# Patient Record
Sex: Female | Born: 1965 | Race: White | Hispanic: No | Smoking: Former smoker
Health system: Southern US, Community
[De-identification: ages and names within clinical notes are randomized; demographics above are authoritative.]

## PROBLEM LIST (undated history)

## (undated) DIAGNOSIS — K759 Inflammatory liver disease, unspecified: Secondary | ICD-10-CM

## (undated) DIAGNOSIS — B2790 Infectious mononucleosis, unspecified without complication: Secondary | ICD-10-CM

## (undated) DIAGNOSIS — R17 Unspecified jaundice: Secondary | ICD-10-CM

## (undated) DIAGNOSIS — I10 Essential (primary) hypertension: Secondary | ICD-10-CM

## (undated) DIAGNOSIS — F32A Depression, unspecified: Secondary | ICD-10-CM

## (undated) DIAGNOSIS — R1310 Dysphagia, unspecified: Secondary | ICD-10-CM

## (undated) DIAGNOSIS — F418 Other specified anxiety disorders: Secondary | ICD-10-CM

## (undated) DIAGNOSIS — F319 Bipolar disorder, unspecified: Secondary | ICD-10-CM

## (undated) DIAGNOSIS — K7460 Unspecified cirrhosis of liver: Secondary | ICD-10-CM

## (undated) DIAGNOSIS — K769 Liver disease, unspecified: Secondary | ICD-10-CM

## (undated) DIAGNOSIS — F3181 Bipolar II disorder: Secondary | ICD-10-CM

## (undated) DIAGNOSIS — F329 Major depressive disorder, single episode, unspecified: Secondary | ICD-10-CM

## (undated) HISTORY — PX: APPENDECTOMY: SHX54

## (undated) HISTORY — PX: ESOPHAGOGASTRODUODENOSCOPY: SHX1529

## (undated) HISTORY — PX: COLONOSCOPY: SHX174

---

## 2011-05-27 ENCOUNTER — Encounter: Payer: Self-pay | Admitting: *Deleted

## 2011-05-27 ENCOUNTER — Emergency Department (HOSPITAL_COMMUNITY)
Admission: EM | Admit: 2011-05-27 | Discharge: 2011-05-27 | Disposition: A | Discharge status: Home / Self Care | Payer: Self-pay | Attending: Emergency Medicine | Admitting: Emergency Medicine

## 2011-05-27 DIAGNOSIS — F329 Major depressive disorder, single episode, unspecified: Secondary | ICD-10-CM | POA: Insufficient documentation

## 2011-05-27 DIAGNOSIS — F419 Anxiety disorder, unspecified: Secondary | ICD-10-CM

## 2011-05-27 DIAGNOSIS — F3289 Other specified depressive episodes: Secondary | ICD-10-CM | POA: Insufficient documentation

## 2011-05-27 DIAGNOSIS — F411 Generalized anxiety disorder: Secondary | ICD-10-CM | POA: Insufficient documentation

## 2011-05-27 DIAGNOSIS — Z79899 Other long term (current) drug therapy: Secondary | ICD-10-CM | POA: Insufficient documentation

## 2011-05-27 HISTORY — DX: Depression, unspecified: F32.A

## 2011-05-27 HISTORY — DX: Bipolar disorder, unspecified: F31.9

## 2011-05-27 HISTORY — DX: Major depressive disorder, single episode, unspecified: F32.9

## 2011-05-27 LAB — CBC
Hemoglobin: 16.4 g/dL — ABNORMAL HIGH (ref 12.0–15.0)
MCH: 29.9 pg (ref 26.0–34.0)
MCHC: 35.9 g/dL (ref 30.0–36.0)
Platelets: 251 10*3/uL (ref 150–400)
RDW: 14.1 % (ref 11.5–15.5)

## 2011-05-27 LAB — COMPREHENSIVE METABOLIC PANEL
AST: 35 U/L (ref 0–37)
Albumin: 4.4 g/dL (ref 3.5–5.2)
Alkaline Phosphatase: 124 U/L — ABNORMAL HIGH (ref 39–117)
BUN: 9 mg/dL (ref 6–23)
Chloride: 96 mEq/L (ref 96–112)
Potassium: 4.2 mEq/L (ref 3.5–5.1)
Sodium: 131 mEq/L — ABNORMAL LOW (ref 135–145)
Total Bilirubin: 0.9 mg/dL (ref 0.3–1.2)
Total Protein: 8.5 g/dL — ABNORMAL HIGH (ref 6.0–8.3)

## 2011-05-27 LAB — DIFFERENTIAL
Basophils Absolute: 0 10*3/uL (ref 0.0–0.1)
Basophils Relative: 0 % (ref 0–1)
Eosinophils Absolute: 0 10*3/uL (ref 0.0–0.7)
Neutro Abs: 10.8 10*3/uL — ABNORMAL HIGH (ref 1.7–7.7)
Neutrophils Relative %: 81 % — ABNORMAL HIGH (ref 43–77)

## 2011-05-27 LAB — RAPID URINE DRUG SCREEN, HOSP PERFORMED
Amphetamines: NOT DETECTED
Barbiturates: NOT DETECTED
Benzodiazepines: NOT DETECTED
Cocaine: NOT DETECTED

## 2011-05-27 LAB — ETHANOL: Alcohol, Ethyl (B): <11 mg/dL (ref 0–11)

## 2011-05-27 LAB — URINALYSIS, ROUTINE W REFLEX MICROSCOPIC
Glucose, UA: NEGATIVE mg/dL
Hgb urine dipstick: NEGATIVE
Ketones, ur: 15 mg/dL — AB
Protein, ur: NEGATIVE mg/dL
pH: 5.5 (ref 5.0–8.0)

## 2011-05-27 LAB — URINE MICROSCOPIC-ADD ON

## 2011-05-27 MED ORDER — LORAZEPAM 1 MG PO TABS
1.0000 mg | ORAL_TABLET | Freq: Once | ORAL | Status: AC
  Administered 2011-05-27: 1 mg via ORAL
  Filled 2011-05-27: qty 1

## 2011-05-27 MED ORDER — ACETAMINOPHEN 325 MG PO TABS
650.0000 mg | ORAL_TABLET | ORAL | Status: DC | PRN
Start: 2011-05-27 — End: 2011-05-27

## 2011-05-27 MED ORDER — ZIPRASIDONE HCL 60 MG PO CAPS: 60.0000 mg | ORAL_CAPSULE | Freq: Two times a day (BID) | ORAL | Status: DC

## 2011-05-27 MED ORDER — ALPRAZOLAM 0.5 MG PO TABS: ORAL_TABLET | ORAL | Status: DC

## 2011-05-27 MED ORDER — CITALOPRAM HYDROBROMIDE 20 MG PO TABS: 20.0000 mg | ORAL_TABLET | Freq: Every day | ORAL | Status: DC

## 2011-05-27 MED ORDER — NICOTINE 21 MG/24HR TD PT24
21.0000 mg | MEDICATED_PATCH | Freq: Every day | TRANSDERMAL | Status: DC
  Administered 2011-05-27: 21 mg via TRANSDERMAL
  Filled 2011-05-27: qty 1

## 2011-05-27 MED ORDER — LORAZEPAM 1 MG PO TABS
1.0000 mg | ORAL_TABLET | ORAL | Status: DC | PRN
  Administered 2011-05-27: 1 mg via ORAL
  Filled 2011-05-27: qty 1

## 2011-05-27 NOTE — ED Notes (Signed)
Pt. Given extra blanket.

## 2011-05-27 NOTE — ED Notes (Signed)
Pt states "I've been going to [place], I keep feeling worse, and worse, and worse, I need help, I can't live like this anymore, the anxiety is bothering me"

## 2011-05-27 NOTE — ED Provider Notes (Signed)
Medical screening examination/treatment/procedure(s) were conducted as a shared visit with non-physician practitioner(s) and myself.  I personally evaluated the patient during the encounter Pt is a 46 year old woman who came to [facility] complaining of anxiety.  She has been followed for this at [facility].  They had treated her with Geodon and Vistaril without benefit.  Physical exam today showed her to have no physical findings.  Medical screening lab testing was negative.  Pt had telepsych consult, with recommendations as follows:  Pt is to stop effexor and Vistaril.  She is to take Geodon 60 mg bid with food, Celexa 20 mg qd, and Xanax 0.5 mg qid prn anxiety.  She can be discharged to followup with [facility].   Carleene Cooper III, MD 05/27/11 1500

## 2011-05-27 NOTE — ED Notes (Signed)
Pt. Signed all D/C papers, given all (3) medication scripts and D/C papers. Pt. Given 2 bags of belongings and walked out to D/C window with [employer].  Pt. Ambulated without any difficulty out of psych ED.

## 2011-05-27 NOTE — ED Notes (Signed)
Pt placed in paper scrubs, wanded by Security, 1 pt bag of belongings & 1 black duffle bag

## 2011-05-27 NOTE — ED Notes (Signed)
Pt. Offered shower, declined.

## 2011-05-27 NOTE — ED Notes (Signed)
Patients sister phoned. Wanted to express her concern about decision to discharge the patient. Patient's sister starting to yell "my sister is not herself. She's not ready to come home. I want you to tell me what's going on". Explained to patient family that due to HIPPA that I was not allowed to discuss anything about the patient. Sister yelling again that "I have the right to know what's going on. If you send her home we'll just bring her back. She's too shy to tell you anything." Explained to family member that I would have patients RN go and speak with patient about concerns. Sister hung up on me.

## 2011-05-27 NOTE — ED Notes (Signed)
Pt. States that she doesn't want to talk, just sleep.  Pt. Denies SI/HI, A/V/H.  States that she has been having anxiety attacks for 3 days.

## 2011-06-07 ENCOUNTER — Emergency Department (HOSPITAL_COMMUNITY)
Admission: EM | Admit: 2011-06-07 | Discharge: 2011-06-08 | Disposition: A | Discharge status: Other Acute Inpatient Hospital | Payer: Self-pay | Attending: Internal Medicine | Admitting: Internal Medicine

## 2011-06-07 ENCOUNTER — Encounter (HOSPITAL_COMMUNITY): Payer: Self-pay | Admitting: *Deleted

## 2011-06-07 DIAGNOSIS — F329 Major depressive disorder, single episode, unspecified: Secondary | ICD-10-CM

## 2011-06-07 DIAGNOSIS — F331 Major depressive disorder, recurrent, moderate: Secondary | ICD-10-CM | POA: Insufficient documentation

## 2011-06-07 DIAGNOSIS — E119 Type 2 diabetes mellitus without complications: Secondary | ICD-10-CM | POA: Insufficient documentation

## 2011-06-07 DIAGNOSIS — F17200 Nicotine dependence, unspecified, uncomplicated: Secondary | ICD-10-CM | POA: Insufficient documentation

## 2011-06-07 DIAGNOSIS — F411 Generalized anxiety disorder: Secondary | ICD-10-CM | POA: Insufficient documentation

## 2011-06-07 DIAGNOSIS — Z79899 Other long term (current) drug therapy: Secondary | ICD-10-CM | POA: Insufficient documentation

## 2011-06-07 LAB — CBC
Hemoglobin: 16.2 g/dL — ABNORMAL HIGH (ref 12.0–15.0)
Platelets: 262 10*3/uL (ref 150–400)
RBC: 5.53 MIL/uL — ABNORMAL HIGH (ref 3.87–5.11)
WBC: 11.8 10*3/uL — ABNORMAL HIGH (ref 4.0–10.5)

## 2011-06-07 LAB — URINALYSIS, ROUTINE W REFLEX MICROSCOPIC
Glucose, UA: NEGATIVE mg/dL
Specific Gravity, Urine: 1.017 (ref 1.005–1.030)

## 2011-06-07 LAB — DIFFERENTIAL
Lymphocytes Relative: 16 % (ref 12–46)
Lymphs Abs: 1.9 10*3/uL (ref 0.7–4.0)
Monocytes Relative: 6 % (ref 3–12)
Neutrophils Relative %: 77 % (ref 43–77)

## 2011-06-07 LAB — BASIC METABOLIC PANEL
CO2: 22 mEq/L (ref 19–32)
Chloride: 96 mEq/L (ref 96–112)
GFR calc non Af Amer: 83 mL/min — ABNORMAL LOW (ref >90)
Glucose, Bld: 177 mg/dL — ABNORMAL HIGH (ref 70–99)
Potassium: 3.7 mEq/L (ref 3.5–5.1)
Sodium: 134 mEq/L — ABNORMAL LOW (ref 135–145)

## 2011-06-07 LAB — RAPID URINE DRUG SCREEN, HOSP PERFORMED
Amphetamines: NOT DETECTED
Benzodiazepines: POSITIVE — AB
Cocaine: NOT DETECTED
Opiates: NOT DETECTED
Tetrahydrocannabinol: NOT DETECTED

## 2011-06-07 LAB — URINE MICROSCOPIC-ADD ON

## 2011-06-07 MED ORDER — ZOLPIDEM TARTRATE 5 MG PO TABS: 5.0000 mg | ORAL_TABLET | Freq: Every evening | ORAL | Status: DC | PRN

## 2011-06-07 MED ORDER — IBUPROFEN 600 MG PO TABS: 600.0000 mg | ORAL_TABLET | Freq: Three times a day (TID) | ORAL | Status: DC | PRN

## 2011-06-07 MED ORDER — NICOTINE 21 MG/24HR TD PT24
21.0000 mg | MEDICATED_PATCH | Freq: Every day | TRANSDERMAL | Status: DC
  Administered 2011-06-07 – 2011-06-08 (×2): 21 mg via TRANSDERMAL
  Filled 2011-06-07 (×2): qty 1

## 2011-06-07 MED ORDER — ACETAMINOPHEN 325 MG PO TABS
650.0000 mg | ORAL_TABLET | ORAL | Status: DC | PRN
  Administered 2011-06-08: 650 mg via ORAL
  Filled 2011-06-07: qty 2

## 2011-06-07 MED ORDER — ONDANSETRON HCL 4 MG PO TABS: 4.0000 mg | ORAL_TABLET | Freq: Three times a day (TID) | ORAL | Status: DC | PRN

## 2011-06-07 MED ORDER — ALUM & MAG HYDROXIDE-SIMETH 200-200-20 MG/5ML PO SUSP: 30.0000 mL | ORAL | Status: DC | PRN

## 2011-06-07 MED ORDER — LORAZEPAM 1 MG PO TABS
1.0000 mg | ORAL_TABLET | Freq: Three times a day (TID) | ORAL | Status: DC | PRN
  Administered 2011-06-07 – 2011-06-08 (×3): 1 mg via ORAL
  Filled 2011-06-07 (×3): qty 1

## 2011-06-07 NOTE — ED Notes (Signed)
Patient on her menstrual cycle at this time.

## 2011-06-07 NOTE — ED Notes (Signed)
One patient belonging back and one black duffel bag locked in locker #[id number]

## 2011-06-07 NOTE — ED Provider Notes (Signed)
History     CSN: [id number]  Arrival date & time 06/07/11  1235   First MD Initiated Contact with Patient 06/07/11 1259      Chief Complaint  Patient presents with  . Depression    (Consider location/radiation/quality/duration/timing/severity/associated sxs/prior treatment) The history is provided by the patient.   46 year old female complains of severe depression. She states "I can't live like this". She has been having spontaneous crying spells, sleep disturbance, and anhedonia. She denies visual or auditory hallucinations. She is complaining of anxiety along with her depression. She states that she wishes she were dead and wishes to go to sleep and does not wake up but she denies having an actual suicidal plan. She's been feeling bad like this for the last month and has been a patient at [facility] but has not been getting adequate help there. She had been in the psychiatric emergency department earlier this month and had been discharged without placement for inpatient care. Symptoms are severe. Nothing makes it better nothing makes it worse.  Past Medical History  Diagnosis Date  . Bipolar disorder   . Anxiety   . Depression   . Diabetes mellitus     Past Surgical History  Procedure Date  . Cesarean section   . Appendectomy     No family history on file.  History  Substance Use Topics  . Smoking status: Current Everyday Smoker -- 1.0 packs/day  . Smokeless tobacco: Not on file  . Alcohol Use: Yes     sometimes    OB History    Grav Para Term Preterm Abortions TAB SAB Ect Mult Living                  Review of Systems  All other systems reviewed and are negative.    Allergies  Review of patient's allergies indicates no known allergies.  Home Medications   Current Outpatient Rx  Name Route Sig Dispense Refill  . ALPRAZOLAM 0.5 MG PO TABS  One by mouth every 6 hours if needed for anxiety. 60 tablet 0  . CITALOPRAM HYDROBROMIDE 20 MG PO TABS Oral Take 1  tablet (20 mg total) by mouth daily. 14 tablet 0  . CITALOPRAM HYDROBROMIDE 40 MG PO TABS Oral Take 40 mg by mouth daily.      Marland Kitchen GABAPENTIN 600 MG PO TABS Oral Take 600 mg by mouth QID.      Marland Kitchen HYDROXYZINE HCL 25 MG PO TABS Oral Take 25 mg by mouth 3 (three) times daily as needed.      Marland Kitchen ZIPRASIDONE HCL 60 MG PO CAPS Oral Take 1 capsule (60 mg total) by mouth 2 (two) times daily with a meal. 28 capsule 0  . ZIPRASIDONE HCL 80 MG PO CAPS Oral Take 80 mg by mouth 2 (two) times daily with a meal.        BP 156/107  Pulse 96  Temp(Src) 99.1 F (37.3 C) (Oral)  Resp 16  Wt 185 lb (83.915 kg)  SpO2 99%  LMP 06/05/2011  Physical Exam  Nursing note and vitals reviewed.  46 year old female who appears overtly depressed and is crying during the exam. Vital signs are normal except for mild hypertension blood pressure 156/107. Oxygen saturation is 99% which is normal. Head is normocephalic and atraumatic. PERRLA, EOMI. Oropharynx is clear. Neck is supple without adenopathy or JVD. Lungs are clear without rales, wheezes, rhonchi. Back is nontender. Heart has regular rate and rhythm without murmur. As the chest wall tenderness.  Abdomen is soft, flat, nontender without masses or hepatosplenomegaly. Extremities have no cyanosis or edema, full range of motion is present. Skin is warm and moist without rash. Neurologic-cranial nerves are intact, there no focal motor Center deficits. Psychiatric: She shows evidence of severe depression with crying spells, and refusing to make eye contact.  ED Course  Procedures (including critical care time)  Results for orders placed during the hospital encounter of 06/07/11  CBC      Component Value Range   WBC 11.8 (*) 4.0 - 10.5 (K/uL)   RBC 5.53 (*) 3.87 - 5.11 (MIL/uL)   Hemoglobin 16.2 (*) 12.0 - 15.0 (g/dL)   HCT 16.1 (*) 09.6 - 46.0 (%)   MCV 83.5  78.0 - 100.0 (fL)   MCH 29.3  26.0 - 34.0 (pg)   MCHC 35.1  30.0 - 36.0 (g/dL)   RDW 04.5  40.9 - 81.1 (%)    Platelets 262  150 - 400 (K/uL)  DIFFERENTIAL      Component Value Range   Neutrophils Relative 77  43 - 77 (%)   Neutro Abs 9.1 (*) 1.7 - 7.7 (K/uL)   Lymphocytes Relative 16  12 - 46 (%)   Lymphs Abs 1.9  0.7 - 4.0 (K/uL)   Monocytes Relative 6  3 - 12 (%)   Monocytes Absolute 0.7  0.1 - 1.0 (K/uL)   Eosinophils Relative 1  0 - 5 (%)   Eosinophils Absolute 0.1  0.0 - 0.7 (K/uL)   Basophils Relative 0  0 - 1 (%)   Basophils Absolute 0.0  0.0 - 0.1 (K/uL)  BASIC METABOLIC PANEL      Component Value Range   Sodium 134 (*) 135 - 145 (mEq/L)   Potassium 3.7  3.5 - 5.1 (mEq/L)   Chloride 96  96 - 112 (mEq/L)   CO2 22  19 - 32 (mEq/L)   Glucose, Bld 177 (*) 70 - 99 (mg/dL)   BUN 12  6 - 23 (mg/dL)   Creatinine, Ser 9.14  0.50 - 1.10 (mg/dL)   Calcium 78.2 (*) 8.4 - 10.5 (mg/dL)   GFR calc non Af Amer 83 (*) >90 (mL/min)   GFR calc Af Amer >90  >90 (mL/min)  URINALYSIS, ROUTINE W REFLEX MICROSCOPIC      Component Value Range   Color, Urine ORANGE (*) YELLOW    APPearance CLOUDY (*) CLEAR    Specific Gravity, Urine 1.017  1.005 - 1.030    pH 5.5  5.0 - 8.0    Glucose, UA NEGATIVE  NEGATIVE (mg/dL)   Hgb urine dipstick LARGE (*) NEGATIVE    Bilirubin Urine MODERATE (*) NEGATIVE    Ketones, ur 15 (*) NEGATIVE (mg/dL)   Protein, ur 30 (*) NEGATIVE (mg/dL)   Urobilinogen, UA 1.0  0.0 - 1.0 (mg/dL)   Nitrite NEGATIVE  NEGATIVE    Leukocytes, UA SMALL (*) NEGATIVE   URINE RAPID DRUG SCREEN (HOSP PERFORMED)      Component Value Range   Opiates NONE DETECTED  NONE DETECTED    Cocaine NONE DETECTED  NONE DETECTED    Benzodiazepines POSITIVE (*) NONE DETECTED    Amphetamines NONE DETECTED  NONE DETECTED    Tetrahydrocannabinol NONE DETECTED  NONE DETECTED    Barbiturates NONE DETECTED  NONE DETECTED   ETHANOL      Component Value Range   Alcohol, Ethyl (B) <11  0 - 11 (mg/dL)  POCT PREGNANCY, URINE      Component Value Range  Preg Test, Ur NEGATIVE    URINE MICROSCOPIC-ADD ON       Component Value Range   Squamous Epithelial / LPF FEW (*) RARE    WBC, UA 7-10  <3 (WBC/hpf)   RBC / HPF TOO NUMEROUS TO COUNT  <3 (RBC/hpf)   Bacteria, UA FEW (*) RARE    Urine-Other MUCOUS PRESENT     No results found.    1. Major depression      She is noted to the psychiatric emergency department for evaluation and appropriate placement.  MDM  Major depression with vague suicidal ideation but no suicidal plan. I do believe that her depression is severe enough that she would benefit from inpatient care. She will be moved to the psychiatric ED for consultation by ACT Team        Dione Booze, MD 06/08/11 [phone]

## 2011-06-07 NOTE — ED Notes (Signed)
Pt states "I have deep depression, I don't want to do anything but I can't keep living like this, [facility] just keeps refilling the same medicine, I came here & just got the same stuff again, I need a crisis unit, it's been a long, long time since I was anywhere, the last time was in [place], I can't go back home like this"

## 2011-06-07 NOTE — ED Notes (Signed)
Pt states she was discharged from here about ten days ago with a prescription and follow up at [facility].  Says that she has been having depression x 6 months and nothing is helping.  Denies SI/HI.
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
## 2011-06-08 ENCOUNTER — Encounter (HOSPITAL_COMMUNITY): Payer: Self-pay | Admitting: *Deleted

## 2011-06-08 ENCOUNTER — Inpatient Hospital Stay (HOSPITAL_COMMUNITY)
Admission: AD | Admit: 2011-06-08 | Discharge: 2011-06-11 | DRG: 885 | Disposition: A | Discharge status: Home / Self Care | Payer: Federal, State, Local not specified - Other | Source: Ambulatory Visit | Attending: Psychiatry | Admitting: Psychiatry

## 2011-06-08 DIAGNOSIS — F411 Generalized anxiety disorder: Secondary | ICD-10-CM

## 2011-06-08 DIAGNOSIS — F329 Major depressive disorder, single episode, unspecified: Secondary | ICD-10-CM | POA: Diagnosis present

## 2011-06-08 DIAGNOSIS — E559 Vitamin D deficiency, unspecified: Secondary | ICD-10-CM | POA: Diagnosis present

## 2011-06-08 DIAGNOSIS — Z79899 Other long term (current) drug therapy: Secondary | ICD-10-CM

## 2011-06-08 DIAGNOSIS — Z560 Unemployment, unspecified: Secondary | ICD-10-CM

## 2011-06-08 DIAGNOSIS — I10 Essential (primary) hypertension: Secondary | ICD-10-CM

## 2011-06-08 DIAGNOSIS — F332 Major depressive disorder, recurrent severe without psychotic features: Principal | ICD-10-CM

## 2011-06-08 DIAGNOSIS — E119 Type 2 diabetes mellitus without complications: Secondary | ICD-10-CM

## 2011-06-08 HISTORY — DX: Essential (primary) hypertension: I10

## 2011-06-08 MED ORDER — ALPRAZOLAM 0.5 MG PO TABS
0.5000 mg | ORAL_TABLET | Freq: Four times a day (QID) | ORAL | Status: DC | PRN
  Administered 2011-06-09 (×2): 0.5 mg via ORAL
  Filled 2011-06-08 (×2): qty 1

## 2011-06-08 MED ORDER — NAPROXEN 500 MG PO TABS
250.0000 mg | ORAL_TABLET | Freq: Two times a day (BID) | ORAL | Status: DC | PRN
  Administered 2011-06-10: 250 mg via ORAL
  Filled 2011-06-08: qty 1

## 2011-06-08 MED ORDER — ACETAMINOPHEN 325 MG PO TABS
650.0000 mg | ORAL_TABLET | Freq: Four times a day (QID) | ORAL | Status: DC | PRN
  Administered 2011-06-09 – 2011-06-10 (×2): 650 mg via ORAL

## 2011-06-08 MED ORDER — CITALOPRAM HYDROBROMIDE 40 MG PO TABS
60.0000 mg | ORAL_TABLET | Freq: Every day | ORAL | Status: DC
  Administered 2011-06-09 – 2011-06-11 (×3): 60 mg via ORAL
  Filled 2011-06-08 (×5): qty 1

## 2011-06-08 MED ORDER — HYDROXYZINE HCL 25 MG PO TABS: 25.0000 mg | ORAL_TABLET | Freq: Three times a day (TID) | ORAL | Status: DC | PRN

## 2011-06-08 MED ORDER — GABAPENTIN 600 MG PO TABS
600.0000 mg | ORAL_TABLET | Freq: Three times a day (TID) | ORAL | Status: DC
  Administered 2011-06-09 – 2011-06-11 (×8): 600 mg via ORAL
  Filled 2011-06-08 (×3): qty 1
  Filled 2011-06-08: qty 42
  Filled 2011-06-08: qty 1
  Filled 2011-06-08: qty 42
  Filled 2011-06-08: qty 1
  Filled 2011-06-08: qty 42
  Filled 2011-06-08: qty 1
  Filled 2011-06-08: qty 42
  Filled 2011-06-08 (×2): qty 1
  Filled 2011-06-08 (×2): qty 42
  Filled 2011-06-08: qty 1

## 2011-06-08 MED ORDER — ZIPRASIDONE HCL 60 MG PO CAPS
60.0000 mg | ORAL_CAPSULE | Freq: Two times a day (BID) | ORAL | Status: DC
  Filled 2011-06-08 (×2): qty 1

## 2011-06-08 MED ORDER — MAGNESIUM HYDROXIDE 400 MG/5ML PO SUSP: 30.0000 mL | Freq: Every day | ORAL | Status: DC | PRN

## 2011-06-08 MED ORDER — ALUM & MAG HYDROXIDE-SIMETH 200-200-20 MG/5ML PO SUSP: 30.0000 mL | ORAL | Status: DC | PRN

## 2011-06-08 MED ORDER — NAPROXEN SODIUM 220 MG PO TABS: 220.0000 mg | ORAL_TABLET | ORAL | Status: DC | PRN

## 2011-06-08 NOTE — BHH Counselor (Signed)
Patient accepted at [facility] [address] to Dan Humphreys. Support paperwork must be completed prior to d/c.

## 2011-06-08 NOTE — Progress Notes (Signed)
Patient ID: Katrina Cohen, female   DOB: Dec 17, 1965, 46 y.o.   MRN: [id number] Pt admitted voluntarily d/t reports of increased depression and anxiety. Pt states she has been experiencing a change in her mood x 2 weeks. Pt states that stressor at that time include financial problems and anxiety r/t son visiting dad in [place]. Son initially missed flight. Pt also states her son will soon be leaving for college. Pt began tearful during admission while discussing her stressors. Pt states "I can't function". "I have been crying all the time and can't do what I want to do". "I can't leave the house sometimes because I get so panicked". "I have spent four days in bed. Pt states she is having passive suicidal ideation but contracts for safety. Denies HI and AVH. Pt states that she attempted suicide by overdose when she was a teenager. Pt reports hx of HTN, DM, TMJ, depression, anxiety, and bipolar. Pt currently smokes 1 ppd.  Fifteen minute checks initiated. Pt oriented to unit.

## 2011-06-08 NOTE — Tx Team (Signed)
Initial Interdisciplinary Treatment Plan  PATIENT STRENGTHS: (choose at least two) Ability for insight Average or above average intelligence Communication skills General fund of knowledge Motivation for treatment/growth Supportive family/friends  PATIENT STRESSORS: Financial difficulties Health problems Occupational concerns   PROBLEM LIST: Problem List/Patient Goals Date to be addressed Date deferred Reason deferred Estimated date of resolution  Suicidal ideation 06/08/2011     depression 06/08/2011     anxiety 06/08/2011     bipolar 06/08/2011                                    DISCHARGE CRITERIA:  Ability to meet basic life and health needs Adequate post-discharge living arrangements Improved stabilization in mood, thinking, and/or behavior Motivation to continue treatment in a less acute level of care Need for constant or close observation no longer present Reduction of life-threatening or endangering symptoms to within safe limits Verbal commitment to aftercare and medication compliance  PRELIMINARY DISCHARGE PLAN: Attend aftercare/continuing care group Participate in family therapy Return to previous living arrangement  PATIENT/FAMIILY INVOLVEMENT: This treatment plan has been presented to and reviewed with the patient, Katrina Cohen, and/or family member,.  The patient and family have been given the opportunity to ask questions and make suggestions.  Hoover Browns 06/08/2011, 8:54 PM

## 2011-06-08 NOTE — ED Notes (Signed)
Pt eating supper waiting for transport

## 2011-06-08 NOTE — BHH Counselor (Signed)
Writer informed Dr. Sherren Mocha and patients nurse-Mike/Janie. Writer completing patients support paperwork. Pt to be transported to [facility] via security.

## 2011-06-08 NOTE — ED Notes (Signed)
1 black duffel, 1 pt belongings bag sent w/ pt

## 2011-06-08 NOTE — ED Notes (Signed)
Pt reports that the geodon interacts w/ the celexa causing her heart to pound

## 2011-06-08 NOTE — ED Notes (Signed)
Up to the desk on the phone 

## 2011-06-08 NOTE — ED Provider Notes (Signed)
Patient accepted to behavioral health.  Cyndra Numbers, MD 06/08/11 [phone]

## 2011-06-08 NOTE — ED Notes (Signed)
Report called-transport approx [phone]

## 2011-06-08 NOTE — ED Provider Notes (Signed)
Patient currently sleeping. She is awaiting placement for major depression.  Dione Booze, MD 06/08/11 [phone]

## 2011-06-09 LAB — GLUCOSE, CAPILLARY: Glucose-Capillary: 130 mg/dL — ABNORMAL HIGH (ref 70–99)

## 2011-06-09 MED ORDER — TRAZODONE HCL 50 MG PO TABS
50.0000 mg | ORAL_TABLET | Freq: Every evening | ORAL | Status: DC | PRN
  Administered 2011-06-09 – 2011-06-11 (×3): 50 mg via ORAL
  Filled 2011-06-09: qty 1
  Filled 2011-06-09: qty 14
  Filled 2011-06-09 (×4): qty 1
  Filled 2011-06-09 (×2): qty 14
  Filled 2011-06-09: qty 1

## 2011-06-09 MED ORDER — CHLORPROMAZINE HCL 25 MG PO TABS
25.0000 mg | ORAL_TABLET | Freq: Three times a day (TID) | ORAL | Status: DC
  Administered 2011-06-09 – 2011-06-11 (×7): 25 mg via ORAL
  Filled 2011-06-09: qty 1
  Filled 2011-06-09 (×2): qty 42
  Filled 2011-06-09 (×2): qty 1
  Filled 2011-06-09: qty 42
  Filled 2011-06-09 (×5): qty 1

## 2011-06-09 MED ORDER — LITHIUM CARBONATE 300 MG PO CAPS
300.0000 mg | ORAL_CAPSULE | Freq: Two times a day (BID) | ORAL | Status: DC
  Administered 2011-06-10 – 2011-06-11 (×3): 300 mg via ORAL
  Filled 2011-06-09 (×3): qty 1
  Filled 2011-06-09: qty 28
  Filled 2011-06-09: qty 1
  Filled 2011-06-09: qty 28

## 2011-06-09 MED ORDER — LISINOPRIL 10 MG PO TABS
10.0000 mg | ORAL_TABLET | Freq: Every day | ORAL | Status: DC
  Administered 2011-06-09 – 2011-06-11 (×3): 10 mg via ORAL
  Filled 2011-06-09 (×3): qty 1
  Filled 2011-06-09: qty 14

## 2011-06-09 MED ORDER — CIPROFLOXACIN HCL 500 MG PO TABS
500.0000 mg | ORAL_TABLET | Freq: Two times a day (BID) | ORAL | Status: DC
  Administered 2011-06-09 – 2011-06-11 (×5): 500 mg via ORAL
  Filled 2011-06-09 (×2): qty 1
  Filled 2011-06-09: qty 3
  Filled 2011-06-09 (×3): qty 1
  Filled 2011-06-09: qty 3
  Filled 2011-06-09 (×2): qty 1
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
## 2011-06-09 NOTE — Progress Notes (Signed)
Patient appeared very sad and depressed during this assessment. Although she denied SI/Hi and denied hallucinations. Pt went to cafeteria with peers for supper. Q 15 minute checks continue to maintain safety.

## 2011-06-09 NOTE — Progress Notes (Signed)
Writer has introduced herself to this newly admitted pt. Pt was lying calmly in bed. Pt was informed of her newly prescribed meds. Pt and roommate had preferences for room temperatures. Pt was provided with extra blankets for comfort. Pt was not interested in spending the night in the quiet room. Continued and support and availability as needed has been extended to this pt.

## 2011-06-09 NOTE — Progress Notes (Signed)
[facility] Group Notes: (Counselor/Nursing/MHT/Case Management/Adjunct) 06/09/2011   @1 :15pm  Type of Therapy:  Group Therapy  Participation Level:  Did Not Attend   [name] 06/09/2011 3:09 PM

## 2011-06-09 NOTE — Progress Notes (Signed)
[facility] Group Notes: (Counselor/Nursing/MHT/Case Management/Adjunct) 06/09/2011   @  11:00am   Type of Therapy:  Group Therapy  Participation Level:  Minimal  Participation Quality:  Attentive  Affect:  Depressed, Tearful  Cognitive:  Appropriate  Insight:  Minimal  Engagement in Group: Minimal  Engagement in Therapy:  None  Modes of Intervention:  Support and Exploration  Summary of Progress/Problems: Katrina Cohen spoke only briefly, stating that her anxiety comes out of nowhere and overtakes her. She seemed somewhat attentive, but did not share further. Near the end of group she appeared to get anxious and another group member tried to help her to calm down.   [name] 06/09/2011 12:52 PM

## 2011-06-09 NOTE — Progress Notes (Signed)
Pt presents teary eyed- this am. Pt reports a high level of anxiety. Pt was offered and given a prn dose of xanax. Pt reports some anxiety related to being in a new place around new people. Pt was offered the opportunity to have a tray delivered  to her room for breakfast, to assist her in adjusting to her surroundings. As of now pt says she will eat in the cafeteria.   Pt diabetes is diet controlled. Her CBG this morning was 130.

## 2011-06-09 NOTE — [facility] Suicide Risk Assessment (Signed)
Suicide Risk Assessment  Admission Assessment     Demographic factors:  Assessment Details Time of Assessment: Admission Information Obtained From: Patient Current Mental Status:  Current Mental Status: Suicidal ideation indicated by patient Loss Factors:    Historical Factors:  Historical Factors: Prior suicide attempts;Family history of mental illness or substance abuse Risk Reduction Factors:  Risk Reduction Factors: Responsible for children under 28 years of age;Sense of responsibility to family;Living with another person, especially a relative;Positive social support  CLINICAL FACTORS:   Bipolar Disorder:   Bipolar II Depressive phase Depression:   Anhedonia Comorbid alcohol abuse/dependence Hopelessness Alcohol/Substance Abuse/Dependencies Previous Psychiatric Diagnoses and Treatments  COGNITIVE FEATURES THAT CONTRIBUTE TO RISK:  Thought constriction (tunnel vision)    SUICIDE RISK:   Moderate:  Frequent suicidal ideation with limited intensity, and duration, some specificity in terms of plans, no associated intent, good self-control, limited dysphoria/symptomatology, some risk factors present, and identifiable protective factors, including available and accessible social support.  Suicidal Ideation:   Plan:  No  Intent:  No  Means:  No  Homicidal Ideation:   Plan:  No  Intent:  No  Means:  No  Mental Status: General Appearance /Behavior:  Casual Eye Contact:  Fair Motor Behavior:  Restlestness Speech:  Normal Level of Consciousness:  Alert Mood:  10 on a scale of 1 is the least and 10 is the most Affect:  Appropriate Anxiety Level:  10 on a scale of 1 is the least and 10 is the most Thought Process:  Coherent Thought Content:  WNL Perception:  Normal Judgment:  Fair Insight:  Present Cognition:  Orientation time, place and person Concentration Yes Sleep:     Vital Signs:Blood pressure 163/99, pulse 86, temperature 96.9 F (36.1 C), temperature  source Oral, resp. rate 18, last menstrual period 06/05/2011. Lab Results: Results for orders placed during the hospital encounter of 06/08/11 (from the past 48 hour(s))  GLUCOSE, CAPILLARY     Status: Abnormal   Collection Time   06/09/11  5:42 AM      Component Value Range Comment   Glucose-Capillary 130 (*) 70 - 99 (mg/dL)      Risk of harm to self is low in that she is scared to die and had an attempt once as a teenager, but is somewhat elevated by her current severe depression and anxiety.  Risk of harm to others is low in that she has never been in a fight and has never threatened to harm anyone else.  Admit pt and add Lithium to her regimen.  Will also add Thorazine for that too.   Katrina Cohen 06/09/2011, 5:56 PM

## 2011-06-09 NOTE — Progress Notes (Signed)
Patient ID: Katrina Cohen, female   DOB: 1965/08/18, 46 y.o.   MRN: [id number] Pt is awake and active this AM. Pt denies SI/HI and AVH. Pt is attending groups and is cooperating with staff. Pt is tearful and anxious today. Pt c/o heart palpitations r/t Geodon. Medication changes made by MD. Pt primary stressor is that her son will be leaving for college. Writer offered self and encouraged pt to rejoin her church and seek friendship and volunteer opportunities. Writer will continue to monitor.

## 2011-06-09 NOTE — Tx Team (Signed)
Interdisciplinary Treatment Plan Update (Adult)  Date:  06/09/2011  Time Reviewed:  10:27 AM   Progress in Treatment: Attending groups: Yes Participating in groups:  Yes, reports she does not want to give specifics in group setting  Taking medication as prescribed:  Yes Tolerating medication: Yes Family/Significant othe contact made:  Counselor requesting consent to speak with sister Patient understands diagnosis: Yes Discussing patient identified problems/goals with staff:  Yes Medical problems stabilized or resolved: Yes Denies suicidal/homicidal ideation: Yes Issues/concerns per patient self-inventory:  No  Other:  New problem(s) identified: None  Reason for Continuation of Hospitalization: Anxiety Depression Medication stabilization  Interventions implemented related to continuation of hospitalization:  Medication stabilization, safety checks q 15 mins, group attendance  Additional comments: Katrina Cohen reports she has not be suicidal at all.   Estimated length of stay: 3-5 days  Discharge Plan: Discharge home with sister and follow up with [facility]  New goal(s):  Review of initial/current patient goals per problem list:   1.  Goal(s): Decrease symptoms of depression  Met:  No  Target date: by discharge  As evidenced by:  Katrina Cohen will rate depression at 3 or less (rates at 10 on admission)  2.  Goal (s):  Decrease anxiety symptoms   Met:  No  Target date: by discharge  As evidenced by: Katrina Cohen will rate depression at less than 5 (rates at 9 today)  3.  Goal(s): Reduce potential for self-harm  Met:  Yes  Target date: by discharge  As evidenced by: Katrina Cohen denies any suicidal or homicidal thoughts    Attendees: Patient:  Katrina Cohen 06/09/2011 10:57 AM   Family:   06/09/2011 10:27 AM  Physician:  Dr Orson Aloe, MD 06/09/2011 10:27 AM  Nursing:   Omelia Blackwater, RN 06/09/2011 10:27 AM  Case Manager:  Reyes Ivan, LCSWA 06/09/2011 10:27 AM    Counselor:  Angus Palms, LCSW 06/09/2011 10:27 AM  Other:  Cato Mulligan, RN  06/09/2011 10:27 AM  Other:  Wilmon Arms, counseling intern 06/09/2011 10:27 AM  Other:   06/09/2011 10:27 AM  Other:   06/09/2011 10:27 AM   Scribe for Treatment Team:   [name], 06/09/2011 10:27 AM
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
## 2011-06-09 NOTE — Progress Notes (Signed)
Pt attended discharge planning group and actively participated.  Pt presents with tearful affect and depressed mood.  Pt states she has been dealing with depression for a long time.  Pt states she's made a lot of mistakes in her life which cause her to feel depressed.  Pt was tearful throughout the conversation and states she is tired of crying all the time.  Pt states she stays in bed and doesn't eat lately.  Pt states she lives in [place] with her sister and 46 year old son.  Pt is most upset about the thought of her son graduating high school next year and leaving her to go to college.  Pt states she regularly goes to [facility] and will return there for medication management and therapy.  SW will schedule follow up for pt.  No further needs at this time.    Reyes Ivan, LCSWA 06/09/2011  1:38 PM

## 2011-06-10 DIAGNOSIS — F329 Major depressive disorder, single episode, unspecified: Secondary | ICD-10-CM | POA: Diagnosis present

## 2011-06-10 LAB — VITAMIN D 25 HYDROXY (VIT D DEFICIENCY, FRACTURES): Vit D, 25-Hydroxy: 21 ng/mL — ABNORMAL LOW (ref 30–89)

## 2011-06-10 NOTE — Progress Notes (Signed)
Patient has been up and about in the milieu today.  Has been sad and depressed most of the day.  Has attended groups today, but has not been really interactive.  Interacting well with peers and staff.  Rates depression at an 8 and hopelessness at 4.  Appetite has been good.

## 2011-06-10 NOTE — Progress Notes (Signed)
[facility] Group Notes:  (Counselor/Nursing/MHT/Case Management/Adjunct)  06/10/2011 1:11 PM  Type of Therapy:  Group Therapy  Participation Level:  None  Participation Quality:  Appropriate and Attentive  Affect:  Appropriate  Cognitive:  Alert and Appropriate  Insight:  None  Engagement in Group:  Limited  Engagement in Therapy:  Limited  Modes of Intervention:  Education, Support and Exploration  Summary of Progress/Problems: Patient did not verbally participate in group discussion. Although, she was very attentive to her peers. Group participants were given a handout to complete and discuss what they put on there task sheet. Katrina Cohen filled out her task sheet, but she not discuss what she wrote.   Wilmon Arms 06/10/2011, 1:11 PM  Katrina Cohen 06/10/2011  2:17 PM

## 2011-06-10 NOTE — Progress Notes (Signed)
Lying quietly in bed with eyes closed.  Safety maintained via Q 15 min activity and location safety checks.

## 2011-06-10 NOTE — Progress Notes (Signed)
[facility] Group Notes:  (Counselor/Nursing/MHT/Case Management/Adjunct)  06/10/2011 2:56 PM  Type of Therapy:  Group Therapy  Participation Level:  Active  Participation Quality:  Appropriate and Attentive  Affect:  Appropriate  Cognitive:  Alert and Appropriate  Insight:  Limited  Engagement in Group:  Limited  Engagement in Therapy:  Limited  Modes of Intervention:  Education and Exploration  Summary of Progress/Problems: Patient participated in relaxation technique using Love and Care Meditation.   Katrina Cohen 06/10/2011, 2:56 PM  Cosigned by: Angus Palms, LCSW

## 2011-06-10 NOTE — Progress Notes (Signed)
Patient seen during d/c planning group.  She advised of admitting to the hospital due to depression and anxiety.  She currently rates depression at eight and anxiety at four.  She currently denies SI/HI.  She advised of being followed by [facility] and stated that she is not on the right medications.  Patient is concerned that upon discharge, Vesta Mixer will change medications. She reports having home and transportation.  She will need assistance with indigent medications.

## 2011-06-11 DIAGNOSIS — F3130 Bipolar disorder, current episode depressed, mild or moderate severity, unspecified: Secondary | ICD-10-CM

## 2011-06-11 DIAGNOSIS — E559 Vitamin D deficiency, unspecified: Secondary | ICD-10-CM | POA: Diagnosis present

## 2011-06-11 MED ORDER — CITALOPRAM HYDROBROMIDE 20 MG PO TABS
60.0000 mg | ORAL_TABLET | Freq: Every day | ORAL | Status: DC
  Filled 2011-06-11 (×2): qty 42

## 2011-06-11 MED ORDER — TRAZODONE HCL 50 MG PO TABS: 50.0000 mg | ORAL_TABLET | Freq: Every evening | ORAL | Status: AC | PRN

## 2011-06-11 MED ORDER — LITHIUM CARBONATE 300 MG PO CAPS: 300.0000 mg | ORAL_CAPSULE | Freq: Two times a day (BID) | ORAL | Status: DC

## 2011-06-11 MED ORDER — CHLORPROMAZINE HCL 25 MG PO TABS: 25.0000 mg | ORAL_TABLET | Freq: Three times a day (TID) | ORAL | Status: AC

## 2011-06-11 MED ORDER — LISINOPRIL 10 MG PO TABS: 10.0000 mg | ORAL_TABLET | Freq: Every day | ORAL | Status: DC

## 2011-06-11 MED ORDER — CIPROFLOXACIN HCL 500 MG PO TABS: 500.0000 mg | ORAL_TABLET | Freq: Two times a day (BID) | ORAL | Status: AC

## 2011-06-11 NOTE — Progress Notes (Signed)
[facility] Adult Inpatient Family/Significant Other Suicide Prevention Education  Suicide Prevention Education:  Education Completed; Katrina Cohen, sister,  has been identified by the patient as the family member/significant other with whom the patient will be residing, and identified as the person(s) who will aid the patient in the event of a mental health crisis (suicidal ideations/suicide attempt).  With written consent from the patient, the family member/significant other has been provided the following suicide prevention education, prior to the and/or following the discharge of the patient.  The suicide prevention education provided includes the following:  Suicide risk factors  Suicide prevention and interventions  National Suicide Hotline telephone number  [facility] assessment telephone number  [facility] Emergency Assistance 911  [facility] and/or Residential Mobile Crisis Unit telephone number  Request made of family/significant other to:  Remove weapons (e.g., guns, rifles, knives), all items previously/currently identified as safety concern.    Remove drugs/medications (over-the-counter, prescriptions, illicit drugs), all items previously/currently identified as a safety concern.  Katrina Cohen reports that she has no concerns about Katrina Cohen being a danger to herself or others, though she is very concerned at how depressed Katrina Cohen has been. She verbalized understanding of suicide prevention information and had no further questions. She reports that Katrina Cohen does not have access to weapons.   Katrina Cohen 06/11/2011, 9:46 AM

## 2011-06-11 NOTE — Progress Notes (Signed)
Adult Services Patient-Family Contact/Session  Attendees:  Counselor and Katrina Cohen, Katrina Cohen  Goal(s):  To address concerns, to assess safey concerns, to educate Cohen on suicide prevention  Safety Concerns:  None  Narrative:  Katrina Cohen reports that she has no concerns about Katrina Cohen being a danger to herself or others, though she is very concerned at how depressed Katrina Cohen has been. She indicated that Katrina Cohen seems to be progressing, but not to her baseline yet. Katrina Cohen reported that she is on disability for Bipolar disorder and will help Katrina Cohen get Medicaid after discharge, but is concerned that going back to [place] until [place] comes through will not be helpful, as [facility] may change the medications Katrina Cohen puts Katrina Cohen on. Counselor explained that all discharge information including medication list will be sent to [facility] to make the transition as smooth as possible. Katrina Cohen verbalized understanding of suicide prevention information and had no further questions.  Barrier(s):  Concern from both [place] and her Cohen about Katrina Cohen changing the medications prescribed by inpatient psychiatrist  Interventions:  Counselor collected collateral information. Counselor assured Katrina Cohen that Katrina Cohen's medication list will be shared with outpatient follow up provider. Counselor educated Katrina Cohen on suicide prevention.  Recommendation(s):  Katrina Cohen to remain in [place] until medications are stabilized and symtpoms of anxiety and depression decrease. Katrina Cohen to follow up with aftercare appointments as set by case manager  Follow-up Required:  No  Explanation:  No safety concerns  Katrina Cohen 06/11/2011, 9:54 AM

## 2011-06-11 NOTE — Discharge Summary (Signed)
Patient ID: Katrina Cohen MRN: [id number] DOB/AGE: 06/16/1965 46 y.o.  Admit date: 06/08/2011 Discharge date: 06/11/2011  Reason for Admission: Major depression and anxiety   Hospital Course:  History of Present Illness:: Katrina Cohen is a 46 year old Caucasian female, admitted to [facility] from the [facility] ED with complaints of major depression and anxiety. Patient reports, I have been extremely depressed with increased anxiety x few months. It got very worse since 1 week. My life is not going the direction that I want it to go. I am unemployed since 2009 and broke financially. I tried to apply for disability, I am currently waiting for a hearing on it. In 2009, I was fired from my job because of my health condition. While a patient in this hospital, patient was started on a medication regimen for her depression and anxiety. They include Lithium Carbonate and Thorazine. She received antibiotics therapy for urinary tract infection. Patient reports on daily basis a reduction in signs and of depression and anxiety.  She participated in group counseling and activities. Patient reports that she learned a lot of coping skills to help her cope better in times of major stress. She reports that she has learned to not indulge in alcohol consumption to manage her anxiety as she has learned that it only made her depression and anxiety worse. She adds that she will not stress on things so much as it makes her lose control instead of finding a solution to a problem.  She is discharged to the home that she shared with son and sister. She will follow up at [facility] on an outpatient basis. VIT D is low and pt is encouraged to increase intake of Vit D as this low level can contribute to depression.  Discharge Diagnoses:  AXIS I Bipolar disorder, depressed  AXIS II Deferred  AXIS III Past Medical History  Diagnosis Date  . Bipolar disorder   . Anxiety   . Depression   . Diabetes mellitus   . Hypertension   Vitamin  D deficiency  AXIS IV economic problems, housing problems and occupational problems  AXIS V 61-70 mild symptoms   Condition on  Discharge: Suicidal Ideation:   Plan:  No  Intent:  No  Means:  No  Homicidal Ideation:   Plan:  No  Intent:  No  Means:  No  Mental Status: General Appearance /Behavior:  Neat Eye Contact:  Good Motor Behavior:  Normal Speech:  Normal Level of Consciousness:  Alert Mood:  4 on a scale of 1 is the least and 10 is the most Affect:  Appropriate Anxiety Level:  4 on a scale of 1 is the least and 10 is the most Thought Process:  Coherent Thought Content:  WNL Perception:  Normal Judgment:  Good Insight:  Present Cognition:  Orientation time, place and person Sleep:  Number of Hours: 5   Vital Signs:Blood pressure 91/68, pulse 98, temperature 97.3 F (36.3 C), temperature source Oral, resp. rate 18, last menstrual period 06/05/2011. Lab Results: Results for orders placed during the hospital encounter of 06/08/11 (from the past 48 hour(s))  VITAMIN D 25 HYDROXY     Status: Abnormal   Collection Time   06/10/11  6:20 AM      Component Value Range Comment   Vit D, 25-Hydroxy 21 (*) 30 - 89 (ng/mL)      Reports what she has learned from this hospital stay is that they never want to take Geodon again and that  she needs to take care of herself and advocate more for herself.  Risk of harm to self is low because she has only had 1 suicide attempt in teens, has worked hard to do well and stay out of the hospital for 17 years.  Has to live for her son, her dog, and for her to either get disability or get back to work.  Risk of harm to others is elevated by her history of multiple fights and history of disorderly conduct charge.  She seems to try to peaceably solve problems now.  Plan:  Medication List  As of 06/11/2011  3:04 PM   STOP taking these medications         ALPRAZolam 0.5 MG tablet      ziprasidone 60 MG capsule      ziprasidone 80 MG  capsule         TAKE these medications         chlorproMAZINE 25 MG tablet   Commonly known as: THORAZINE   Take 1 tablet (25 mg total) by mouth 3 (three) times daily.      ciprofloxacin 500 MG tablet   Commonly known as: CIPRO   Take 1 tablet (500 mg total) by mouth 2 (two) times daily. Until finished      citalopram 20 MG tablet   Commonly known as: CELEXA   Take 60 mg by mouth daily.      gabapentin 600 MG tablet   Commonly known as: NEURONTIN   Take 600 mg by mouth 3 (three) times daily.      hydrOXYzine 25 MG tablet   Commonly known as: ATARAX/VISTARIL   Take 25 mg by mouth 3 (three) times daily as needed. For anxiety      lisinopril 10 MG tablet   Commonly known as: PRINIVIL,ZESTRIL   Take 1 tablet (10 mg total) by mouth daily.      lithium carbonate 300 MG capsule   Take 1 capsule (300 mg total) by mouth 2 (two) times daily after a meal.      naproxen sodium 220 MG tablet   Commonly known as: ANAPROX   Take 220 mg by mouth as needed. For pain.      traZODone 50 MG tablet   Commonly known as: DESYREL   Take 1 tablet (50 mg total) by mouth at bedtime and may repeat dose one time if needed.           Follow-up Information    Follow up with Dayna Ramus. (Your appointment with French Ana is at 9 a..m. on  Tuesday, June 15, 2011)    Contact information:   [address] [place], [place]  [address]  [phone]      Follow up with Clerance Lav [facility] on 06/21/2011. (Your appointment with Clerance Lav is scheduled for 3:40 p.m. on Monday, June 21, 2011)    Contact information:   [address]  [phone]        SignedDan Humphreys, EDWIN 06/11/2011, 3:04 PM

## 2011-06-11 NOTE — Progress Notes (Addendum)
[facility] Case Management Discharge Plan:  Will you be returning to the same living situation after discharge: Yes,  Willl return home with family At discharge, do you have transportation home?:Yes,  Family to transport for discharge Do you have the ability to pay for your medications:No.  Assisted with indigent medications  Interagency Information:     Release of information consent forms completed and in the chart;  Patient's signature needed at discharge.  Patient to Follow up at:  Patient denies SI/HI:   Yes,  Katrina Cohen is not endorsing SI at this time    Safety Planning and Suicide Prevention discussed:  Yes,  Review during discharge planning groups  Barrier to discharge identified:No.  Summary and Recommendations:  Follow up with [facility] and advocate for no changes as she feels stable on current meds    Katrina Cohen, Katrina Cohen July 06/11/2011, 10:31 AM

## 2011-06-11 NOTE — Tx Team (Signed)
Interdisciplinary Treatment Plan Update (Adult)  Date:  06/11/2011  Time Reviewed:  11:31 AM   Progress in Treatment: Attending groups:   Yes   Participating in groups:  Yes Taking medication as prescribed:  Yes Tolerating medication:  Yes Family/Significant othe contact made: Counselor made contact with sister Patient understands diagnosis:  Yes Discussing patient identified problems/goals with staff: Yes Medical problems stabilized or resolved: Yes Denies suicidal/homicidal ideation:Yes Issues/concerns per patient self-inventory: None identified Other:  New problem(s) identified:  Reason for Continuation of Hospitalization:  Interventions implemented related to continuation of hospitalization:  Additional comments:  Estimated length of stay:  Discharge home today  Discharge Plan:  Home with family - Follow up [facility]  New goal(s):  Review of initial/current patient goals per problem list:    1.  Goal(s):  .Eliminate SI   Met:  Yes  Target date:  D/c  As evidenced by:  Katrina Cohen is denying SI   2.  Goal (s):  Reduce depression  Met:  Yes  Target date:  d/c  As evidenced by:  Rates depression at four; rated at ten on admission  3.  Goal(s):  Reduce anxiety  Met:  Yes  Target date:d/c  As evidenced by:  Rates anxiety at two, rated at nine on admisison  4.  Goal(s):  Stabilize on medications  Met:  Yes  Target date:  d/c  As evidenced by:  Katrina Cohen reports medications are working; less symptomatic - no heart palpitations  Attendees: Patient:  Katrina Cohen 06/11/2011  11:32 AM   Family:     Physician:  Orson Aloe, MD 06/11/2011 11:31 AM   Nursing:   Cato Mulligan, RN 06/11/2011 11:31 AM   CaseManager:  Juline Patch, LCSW 06/11/2011 11:31 AM   Counselor:  Angus Palms, LCSW 06/11/2011 11:31 AM   Other:  Consuello Bossier, NP 06/11/2011 11:31 AM   Other:  Reyes Ivan, LCSWA 06/11/2011  11:31 AM   Other:     Other:      Scribe for Treatment Team:     Wynn Banker, LCSW,  06/11/2011 11:31 AM

## 2011-06-11 NOTE — Progress Notes (Signed)
Patient affects and mood continues to be flat and depressed. She appears gloomy and very sad, denies SI/HI. Pt came to medication window at 0038 and requested for her repeat dose of Trazodone. Medication given and ordered. Q 15 minute checks continue as ordered to maintain patient's safety.

## 2011-06-11 NOTE — Progress Notes (Signed)
Recreation Therapy Notes  06/11/2011         Time: 1415      Group Topic/Focus: The focus of this group is on discussing various styles of communication and communicating assertively using 'I' (feeling) statements.  Participation Level: Minimal  Participation Quality: Attentive  Affect: Appropriate  Cognitive: Oriented   Additional Comments: Patient missed about half of group as she was meeting with MD.   Ashley Royalty, Shamiah Kahler 06/11/2011 4:27 PM

## 2011-06-11 NOTE — [facility] Suicide Risk Assessment (Signed)
Suicide Risk Assessment  Discharge Assessment     Demographic factors:  Assessment Details Time of Assessment: Admission Information Obtained From: Patient Current Mental Status:  Current Mental Status: Suicidal ideation indicated by patient Risk Reduction Factors:  Risk Reduction Factors: Responsible for children under 46 years of age;Sense of responsibility to family;Living with another person, especially a relative;Positive social support  CLINICAL FACTORS:   Severe Anxiety and/or Agitation Depression:   Anhedonia Hopelessness Chronic Pain Previous Psychiatric Diagnoses and Treatments Medical Diagnoses and Treatments/Surgeries  COGNITIVE FEATURES THAT CONTRIBUTE TO RISK:  No Cognitive risk factors noted.   SUICIDE RISK:   Minimal: No identifiable suicidal ideation.  Patients presenting with no risk factors but with morbid ruminations; may be classified as minimal risk based on the severity of the depressive symptoms  Suicidal Ideation:   Plan:  No  Intent:  No  Means:  No  Homicidal Ideation:   Plan:  No  Intent:  No  Means:  No  Mental Status: General Appearance /Behavior:  Neat Eye Contact:  Good Motor Behavior:  Normal Speech:  Normal Level of Consciousness:  Alert Mood:  4 on a scale of 1 is the least and 10 is the most Affect:  Appropriate Anxiety Level:  4 on a scale of 1 is the least and 10 is the most Thought Process:  Coherent Thought Content:  WNL Perception:  Normal Judgment:  Good Insight:  Present Cognition:  Orientation time, place and person Sleep:  Number of Hours: 5   Vital Signs:Blood pressure 91/68, pulse 98, temperature 97.3 F (36.3 C), temperature source Oral, resp. rate 18, last menstrual period 06/05/2011. Lab Results: Results for orders placed during the hospital encounter of 06/08/11 (from the past 48 hour(s))  VITAMIN D 25 HYDROXY     Status: Abnormal   Collection Time   06/10/11  6:20 AM      Component Value Range Comment   Vit D, 25-Hydroxy 21 (*) 30 - 89 (ng/mL)      Reports what she has learned from this hospital stay is that they never want to take Geodon again and that she needs to take care of herself and advocate more for herself.  Risk of harm to self is low because she has only had 1 suicide attempt in teens, has worked hard to do well and stay out of the hospital for 17 years.  Has to live for her son, her dog, and for her to either get disability or get back to work.  Risk of harm to others is elevated by her history of multiple fights and history of disorderly conduct charge.  She seems to try to peaceably solve problems now.  Continue Medication List  As of 06/11/2011  2:50 PM   STOP taking these medications         ALPRAZolam 0.5 MG tablet      ziprasidone 60 MG capsule      ziprasidone 80 MG capsule         TAKE these medications         chlorproMAZINE 25 MG tablet   Commonly known as: THORAZINE   Take 1 tablet (25 mg total) by mouth 3 (three) times daily.      ciprofloxacin 500 MG tablet   Commonly known as: CIPRO   Take 1 tablet (500 mg total) by mouth 2 (two) times daily. Until finished      citalopram 20 MG tablet   Commonly known as: CELEXA   Take 60  mg by mouth daily.      gabapentin 600 MG tablet   Commonly known as: NEURONTIN   Take 600 mg by mouth 3 (three) times daily.      hydrOXYzine 25 MG tablet   Commonly known as: ATARAX/VISTARIL   Take 25 mg by mouth 3 (three) times daily as needed. For anxiety      lisinopril 10 MG tablet   Commonly known as: PRINIVIL,ZESTRIL   Take 1 tablet (10 mg total) by mouth daily.      lithium carbonate 300 MG capsule   Take 1 capsule (300 mg total) by mouth 2 (two) times daily after a meal.      naproxen sodium 220 MG tablet   Commonly known as: ANAPROX   Take 220 mg by mouth as needed. For pain.      traZODone 50 MG tablet   Commonly known as: DESYREL   Take 1 tablet (50 mg total) by mouth at bedtime and may repeat dose one  time if needed.           Because Neurontin helps anxiety, pain, and mood, Lithium helps control mood, Throazine and Vistaril help control anxiety, Celexa helps boost mood, Prinivil helps control blood pressure, Trazodone helps with insomnia., Cipro is for an infection.  Mead Slane 06/11/2011, 2:50 PM

## 2011-06-11 NOTE — Discharge Summary (Signed)
Discharge Note  Patient:  Katrina Cohen is an 46 y.o., female DOB:  December 31, 1965  Date of Admission:  06/08/2011  Date of Discharge:  06/11/2011  Level of Care:  OP  Discharge destination:  HOME  Is patient on multiple antipsychotic therapies at discharge:  NO  Patient phone:  [phone] (home) Patient address:   [address] [place]  [place] [address]  The patient received suicide prevention pamphlet:  YES Belongings returned:  Judene Companion, Disaya Walt 06/11/2011,3:04 PM

## 2011-06-11 NOTE — Progress Notes (Signed)
[facility] Group Notes: (Counselor/Nursing/MHT/Case Management/Adjunct) 06/11/2011   @  11:00am   Type of Therapy:  Group Therapy  Participation Level:  Limited  Participation Quality:  Attentive, Supportive, Appropriate  Affect:  Blunted  Cognitive:  Appropriate  Insight:  Limited  Engagement in Group: Good   Engagement in Therapy:  Limited  Modes of Intervention:  Support and Exploration  Summary of Progress/Problems:  Katrina Cohen was attentive and though she did not share personally she was supportive of other group members' statements.   [name] 06/11/2011 12:29 PM

## 2011-06-11 NOTE — Progress Notes (Signed)
[facility] Group Notes:  (Counselor/Nursing/MHT/Case Management/Adjunct)  06/11/2011 1:15pm  Type of Therapy:  Psychoeducational Skills - [facility] [place]  Participation Level:  Active  Participation Quality:  Appropriate and Attentive  Affect:  Blunted  Cognitive:  Appropriate  Insight:  Good  Engagement in Group:  Good  Engagement in Therapy:  None  Modes of Intervention:  Education and Support  Summary of Progress/Problems: Eriyonna was very engaged in discussion with speaker from [facility]. She expressed appreciation for the the organization and interest in the support groups it runs.   [name] 06/11/2011  2:35 PM

## 2011-06-11 NOTE — Progress Notes (Signed)
Patient ID: Katrina Cohen, female   DOB: 06/28/1965, 46 y.o.   MRN: [id number] Writer reviewed discharge instructions with pt including medications, follow up appointments and crisis intervention. Pt verbally acknowledged understanding of all instructions. Pt denies SI/HI and states that she feels prepared to discharge at this this time. Pt mood and affect are appropriate to the situation. Pt belongings returned from locker and she is released into her own care.

## 2011-06-14 NOTE — Progress Notes (Signed)
Patient Discharge Instructions:  Admission Note Faxed,  06/14/2011 Discharge Note Faxed,   06/14/2011 After Visit Summary Faxed,  06/14/2011 Faxed to the Next Level Care provider:  06/14/2011 D/C Summary faxed 06/14/2011 Facesheet faxed 06/14/2011   Faxed to Sharene Butters @ [phone]  Wandra Scot, 06/14/2011, 5:26 PM

## 2013-01-31 ENCOUNTER — Other Ambulatory Visit (HOSPITAL_COMMUNITY): Payer: Self-pay | Admitting: Internal Medicine

## 2013-01-31 DIAGNOSIS — R05 Cough: Secondary | ICD-10-CM

## 2013-02-02 ENCOUNTER — Other Ambulatory Visit (HOSPITAL_COMMUNITY): Payer: Self-pay | Admitting: Internal Medicine

## 2013-02-02 DIAGNOSIS — R05 Cough: Secondary | ICD-10-CM

## 2013-02-08 ENCOUNTER — Encounter (HOSPITAL_COMMUNITY): Payer: Self-pay

## 2013-02-08 ENCOUNTER — Ambulatory Visit (HOSPITAL_COMMUNITY)
Admission: RE | Admit: 2013-02-08 | Discharge: 2013-02-08 | Disposition: A | Discharge status: Home / Self Care | Payer: Medicaid Other | Source: Ambulatory Visit | Attending: Internal Medicine | Admitting: Internal Medicine

## 2013-02-08 DIAGNOSIS — R05 Cough: Secondary | ICD-10-CM

## 2013-02-08 DIAGNOSIS — R059 Cough, unspecified: Secondary | ICD-10-CM | POA: Insufficient documentation

## 2013-02-08 DIAGNOSIS — K7689 Other specified diseases of liver: Secondary | ICD-10-CM | POA: Insufficient documentation

## 2013-02-08 MED ORDER — IOHEXOL 300 MG/ML  SOLN
80.0000 mL | Freq: Once | INTRAMUSCULAR | Status: AC | PRN
  Administered 2013-02-08: 80 mL via INTRAVENOUS

## 2014-11-17 ENCOUNTER — Encounter (HOSPITAL_COMMUNITY): Payer: Self-pay

## 2014-11-17 ENCOUNTER — Emergency Department (HOSPITAL_COMMUNITY)
Admission: EM | Admit: 2014-11-17 | Discharge: 2014-11-17 | Disposition: A | Discharge status: Home / Self Care | Payer: Medicaid Other | Attending: Emergency Medicine | Admitting: Emergency Medicine

## 2014-11-17 DIAGNOSIS — R1310 Dysphagia, unspecified: Secondary | ICD-10-CM | POA: Insufficient documentation

## 2014-11-17 DIAGNOSIS — Z79899 Other long term (current) drug therapy: Secondary | ICD-10-CM | POA: Insufficient documentation

## 2014-11-17 DIAGNOSIS — I10 Essential (primary) hypertension: Secondary | ICD-10-CM | POA: Diagnosis not present

## 2014-11-17 DIAGNOSIS — F329 Major depressive disorder, single episode, unspecified: Secondary | ICD-10-CM

## 2014-11-17 DIAGNOSIS — F419 Anxiety disorder, unspecified: Secondary | ICD-10-CM | POA: Diagnosis not present

## 2014-11-17 DIAGNOSIS — Z7982 Long term (current) use of aspirin: Secondary | ICD-10-CM | POA: Diagnosis not present

## 2014-11-17 DIAGNOSIS — Z3202 Encounter for pregnancy test, result negative: Secondary | ICD-10-CM | POA: Insufficient documentation

## 2014-11-17 DIAGNOSIS — F1310 Sedative, hypnotic or anxiolytic abuse, uncomplicated: Secondary | ICD-10-CM | POA: Diagnosis not present

## 2014-11-17 DIAGNOSIS — Z720 Tobacco use: Secondary | ICD-10-CM | POA: Insufficient documentation

## 2014-11-17 DIAGNOSIS — F319 Bipolar disorder, unspecified: Secondary | ICD-10-CM | POA: Insufficient documentation

## 2014-11-17 DIAGNOSIS — E119 Type 2 diabetes mellitus without complications: Secondary | ICD-10-CM | POA: Insufficient documentation

## 2014-11-17 DIAGNOSIS — F32A Depression, unspecified: Secondary | ICD-10-CM

## 2014-11-17 LAB — CBC
HCT: 46.4 % — ABNORMAL HIGH (ref 36.0–46.0)
Hemoglobin: 16 g/dL — ABNORMAL HIGH (ref 12.0–15.0)
MCH: 31.2 pg (ref 26.0–34.0)
MCHC: 34.5 g/dL (ref 30.0–36.0)
MCV: 90.4 fL (ref 78.0–100.0)
Platelets: 185 10*3/uL (ref 150–400)
RBC: 5.13 MIL/uL — ABNORMAL HIGH (ref 3.87–5.11)
RDW: 13.5 % (ref 11.5–15.5)
WBC: 8.1 10*3/uL (ref 4.0–10.5)

## 2014-11-17 LAB — COMPREHENSIVE METABOLIC PANEL
ALT: 81 U/L — ABNORMAL HIGH (ref 14–54)
AST: 125 U/L — ABNORMAL HIGH (ref 15–41)
Albumin: 4.2 g/dL (ref 3.5–5.0)
Alkaline Phosphatase: 146 U/L — ABNORMAL HIGH (ref 38–126)
Anion gap: 18 — ABNORMAL HIGH (ref 5–15)
BUN: 5 mg/dL — ABNORMAL LOW (ref 6–20)
CO2: 19 mmol/L — ABNORMAL LOW (ref 22–32)
Calcium: 9.5 mg/dL (ref 8.9–10.3)
Chloride: 96 mmol/L — ABNORMAL LOW (ref 101–111)
Creatinine, Ser: 0.64 mg/dL (ref 0.44–1.00)
GFR calc Af Amer: >60 mL/min (ref >60)
GFR calc non Af Amer: >60 mL/min (ref >60)
Glucose, Bld: 158 mg/dL — ABNORMAL HIGH (ref 65–99)
Potassium: 4 mmol/L (ref 3.5–5.1)
Sodium: 133 mmol/L — ABNORMAL LOW (ref 135–145)
Total Bilirubin: 1.4 mg/dL — ABNORMAL HIGH (ref 0.3–1.2)
Total Protein: 8.3 g/dL — ABNORMAL HIGH (ref 6.5–8.1)

## 2014-11-17 LAB — ETHANOL: Alcohol, Ethyl (B): 108 mg/dL — ABNORMAL HIGH (ref <5)

## 2014-11-17 LAB — RAPID URINE DRUG SCREEN, HOSP PERFORMED
Amphetamines: NOT DETECTED
Barbiturates: NOT DETECTED
Benzodiazepines: POSITIVE — AB
Cocaine: NOT DETECTED
Opiates: NOT DETECTED
Tetrahydrocannabinol: NOT DETECTED

## 2014-11-17 LAB — ACETAMINOPHEN LEVEL: Acetaminophen (Tylenol), Serum: <10 ug/mL — ABNORMAL LOW (ref 10–30)

## 2014-11-17 LAB — POC URINE PREG, ED: Preg Test, Ur: NEGATIVE

## 2014-11-17 LAB — SALICYLATE LEVEL: Salicylate Lvl: <4 mg/dL (ref 2.8–30.0)

## 2014-11-17 NOTE — ED Notes (Addendum)
Patient states that she has had increased anxiety and depression over the past 2 months. Patient states she wants to take her medications prescribed, but has been having N/V. Patient is very tearful. Patient denies SI/Hi, auditory or visual hallucinations. Patient c/o rash lower abdomen and sore throat.

## 2014-11-17 NOTE — ED Provider Notes (Signed)
CSN: [id number]     Arrival date & time 11/17/14  1250 History   First MD Initiated Contact with Patient 11/17/14 1438     Chief Complaint  Patient presents with  . Anxiety  . Depression     (Consider location/radiation/quality/duration/timing/severity/associated sxs/prior Treatment) HPI   49 year old female with increasing anxiety and depression. Worse the past couple months. Patient feels like her symptoms are worsening she is having difficulty taking her medicines. She reports progressive dysphagia to both solids and liquids. Denies any throat chest pain. Has never been formally evaluated for this or had endoscopy. She denies any suicidal homicidal ideation. Denies any hallucinations.   Past Medical History  Diagnosis Date  . Bipolar disorder   . Anxiety   . Depression   . Diabetes mellitus   . Hypertension    Past Surgical History  Procedure Laterality Date  . Cesarean section    . Appendectomy    . Colonoscopy     Family History  Problem Relation Age of Onset  . Diabetes Mother   . Cancer Father   . Diabetes Sister   . Diabetes Brother    History  Substance Use Topics  . Smoking status: Current Every Day Smoker -- 1.00 packs/day    Types: Cigarettes  . Smokeless tobacco: Not on file  . Alcohol Use: Yes     Comment: occasionally   OB History    No data available     Review of Systems  All systems reviewed and negative, other than as noted in HPI.   Allergies  Review of patient's allergies indicates no known allergies.  Home Medications   Prior to Admission medications   Medication Sig Start Date End Date Taking? Authorizing Provider  aspirin EC 81 MG tablet Take 81 mg by mouth daily.   Yes Historical Provider, MD  Aspirin-Acetaminophen-Caffeine (GOODY HEADACHE PO) Take 1 packet by mouth as needed (for headaches).   Yes Historical Provider, MD  Brexpiprazole (REXULTI) 2 MG TABS Take 2 mg by mouth daily.   Yes Historical Provider, MD  cholecalciferol  (VITAMIN D) 1000 UNITS tablet Take 1,000 Units by mouth daily.   Yes Historical Provider, MD  citalopram (CELEXA) 20 MG tablet Take 40 mg by mouth daily.  05/27/11 11/17/14 Yes Carleene Cooper, MD  lamoTRIgine (LAMICTAL) 100 MG tablet Take 300 mg by mouth daily.   Yes Historical Provider, MD  LORazepam (ATIVAN) 1 MG tablet Take 1 mg by mouth 2 (two) times daily.   Yes Historical Provider, MD  metFORMIN (GLUCOPHAGE) 500 MG tablet Take 500 mg by mouth 2 (two) times daily with a meal.   Yes Historical Provider, MD   BP 131/87 mmHg  Pulse 88  Temp(Src) 98 F (36.7 C) (Oral)  Resp 17  Ht  (1.626 m)  Wt 180 lb (81.647 kg)  BMI 30.88 kg/m2  SpO2 97%  LMP  (Approximate) Physical Exam  Constitutional: She appears well-developed and well-nourished. No distress.  HENT:  Head: Normocephalic and atraumatic.  Eyes: Conjunctivae are normal. Right eye exhibits no discharge. Left eye exhibits no discharge.  Neck: Neck supple.  Cardiovascular: Normal rate, regular rhythm and normal heart sounds.  Exam reveals no gallop and no friction rub.   No murmur heard. Pulmonary/Chest: Effort normal and breath sounds normal. No respiratory distress.  Abdominal: Soft. She exhibits no distension. There is no tenderness.  Musculoskeletal: She exhibits no edema or tenderness.  Neurological: She is alert.  Skin: Skin is warm and dry.  Psychiatric:  She has a normal mood and affect. Her behavior is normal. Thought content normal.  Speech clear. Content appropriate. Doesn't appear to be responding to internal stimuli.  Nursing note and vitals reviewed.   ED Course  Procedures (including critical care time) Labs Review Labs Reviewed  CBC - Abnormal; Notable for the following:    RBC 5.13 (*)    Hemoglobin 16.0 (*)    HCT 46.4 (*)    All other components within normal limits  COMPREHENSIVE METABOLIC PANEL - Abnormal; Notable for the following:    Sodium 133 (*)    Chloride 96 (*)    CO2 19 (*)    Glucose,  Bld 158 (*)    BUN 5 (*)    Total Protein 8.3 (*)    AST 125 (*)    ALT 81 (*)    Alkaline Phosphatase 146 (*)    Total Bilirubin 1.4 (*)    Anion gap 18 (*)    All other components within normal limits  ETHANOL - Abnormal; Notable for the following:    Alcohol, Ethyl (B) 108 (*)    All other components within normal limits  ACETAMINOPHEN LEVEL - Abnormal; Notable for the following:    Acetaminophen (Tylenol), Serum <10 (*)    All other components within normal limits  URINE RAPID DRUG SCREEN, HOSP PERFORMED - Abnormal; Notable for the following:    Benzodiazepines POSITIVE (*)    All other components within normal limits  SALICYLATE LEVEL  POC URINE PREG, ED    Imaging Review No results found.   EKG Interpretation None      MDM   Final diagnoses:  Dysphagia  Depression    49yF with depression. Long history of same. Says not taking medications because of progressive dysphagia over past several months. Feels like she is more depressed because she cannot get her pills down. Needs to see GI for evaluation. No SI/HI or psychosis. No indication for emergent psychiatric or medical care. Reports can't take any of her pills but UDS is positive for benzos. Noted elevation in ETOH as well.     Raeford Razor, MD 11/20/14 [phone]

## 2014-11-17 NOTE — Discharge Instructions (Signed)
Depression °Depression refers to feeling sad, low, down in the dumps, blue, gloomy, or empty. In general, there are two kinds of depression: °· Normal sadness or normal grief. This kind of depression is one that we all feel from time to time after upsetting life experiences, such as the loss of a job or the ending of a relationship. This kind of depression is considered normal, is short lived, and resolves within a few days to 2 weeks. Depression experienced after the loss of a loved one (bereavement) often lasts longer than 2 weeks but normally gets better with time. °· Clinical depression. This kind of depression lasts longer than normal sadness or normal grief or interferes with your ability to function at home, at work, and in school. It also interferes with your personal relationships. It affects almost every aspect of your life. Clinical depression is an illness. °Symptoms of depression can also be caused by conditions other than those mentioned above, such as: °· Physical illness. Some physical illnesses, including underactive thyroid gland (hypothyroidism), severe anemia, specific types of cancer, diabetes, uncontrolled seizures, heart and lung problems, strokes, and chronic pain are commonly associated with symptoms of depression. °· Side effects of some prescription medicine. In some people, certain types of medicine can cause symptoms of depression. °· Substance abuse. Abuse of alcohol and illicit drugs can cause symptoms of depression. °SYMPTOMS °Symptoms of normal sadness and normal grief include the following: °· Feeling sad or crying for short periods of time. °· Not caring about anything (apathy). °· Difficulty sleeping or sleeping too much. °· No longer able to enjoy the things you used to enjoy. °· Desire to be by oneself all the time (social isolation). °· Lack of energy or motivation. °· Difficulty concentrating or remembering. °· Change in appetite or weight. °· Restlessness or  agitation. °Symptoms of clinical depression include the same symptoms of normal sadness or normal grief and also the following symptoms: °· Feeling sad or crying all the time. °· Feelings of guilt or worthlessness. °· Feelings of hopelessness or helplessness. °· Thoughts of suicide or the desire to harm yourself (suicidal ideation). °· Loss of touch with reality (psychotic symptoms). Seeing or hearing things that are not real (hallucinations) or having false beliefs about your life or the people around you (delusions and paranoia). °DIAGNOSIS  °The diagnosis of clinical depression is usually based on how bad the symptoms are and how long they have lasted. Your health care provider will also ask you questions about your medical history and substance use to find out if physical illness, use of prescription medicine, or substance abuse is causing your depression. Your health care provider may also order blood tests. °TREATMENT  °Often, normal sadness and normal grief do not require treatment. However, sometimes antidepressant medicine is given for bereavement to ease the depressive symptoms until they resolve. °The treatment for clinical depression depends on how bad the symptoms are but often includes antidepressant medicine, counseling with a mental health professional, or both. Your health care provider will help to determine what treatment is best for you. °Depression caused by physical illness usually goes away with appropriate medical treatment of the illness. If prescription medicine is causing depression, talk with your health care provider about stopping the medicine, decreasing the dose, or changing to another medicine. °Depression caused by the abuse of alcohol or illicit drugs goes away when you stop using these substances. Some adults need professional help in order to stop drinking or using drugs. °SEEK IMMEDIATE MEDICAL   CARE IF:  You have thoughts about hurting yourself or others.  You lose touch  with reality (have psychotic symptoms).  You are taking medicine for depression and have a serious side effect. FOR MORE INFORMATION  National Alliance on Mental Illness: www.nami.[employer] of Mental Health: http://www.maynard.net/ Document Released: 05/07/2000 Document Revised: 09/24/2013 Document Reviewed: 08/09/2011 [facility] Patient Information 2015 [place], [place]. This information is not intended to replace advice given to you by your health care provider. Make sure you discuss any questions you have with your health care provider.  Dysphagia Swallowing problems (dysphagia) occur when solids and liquids seem to stick in your throat on the way down to your stomach, or the food takes longer to get to the stomach. Other symptoms include regurgitating food, noises coming from the throat, chest discomfort with swallowing, and a feeling of fullness or the feeling of something being stuck in your throat when swallowing. When blockage in your throat is complete, it may be associated with drooling. CAUSES  Problems with swallowing may occur because of problems with the muscles. The food cannot be propelled in the usual manner into your stomach. You may have ulcers, scar tissue, or inflammation in the tube down which food travels from your mouth to your stomach (esophagus), which blocks food from passing normally into the stomach. Causes of inflammation include:  Acid reflux from your stomach into your esophagus.  Infection.  Radiation treatment for cancer.  Medicines taken without enough fluids to wash them down into your stomach. You may have nerve problems that prevent signals from being sent to the muscles of your esophagus to contract and move your food down to your stomach. Globus pharyngeus is a relatively common problem in which there is a sense of an obstruction or difficulty in swallowing, without any physical abnormalities of the swallowing passages being found. This problem  usually improves over time with reassurance and testing to rule out other causes. DIAGNOSIS Dysphagia can be diagnosed and its cause can be determined by tests in which you swallow a white substance that helps illuminate the inside of your throat (contrast medium) while X-rays are taken. Sometimes a flexible telescope that is inserted down your throat (endoscopy) to look at your esophagus and stomach is used. TREATMENT   If the dysphagia is caused by acid reflux or infection, medicines may be used.  If the dysphagia is caused by problems with your swallowing muscles, swallowing therapy may be used to help you strengthen your swallowing muscles.  If the dysphagia is caused by a blockage or mass, procedures to remove the blockage may be done. HOME CARE INSTRUCTIONS  Try to eat soft food that is easier to swallow and check your weight on a daily basis to be sure that it is not decreasing.  Be sure to drink liquids when sitting upright (not lying down). SEEK MEDICAL CARE IF:  You are losing weight because you are unable to swallow.  You are coughing when you drink liquids (aspiration).  You are coughing up partially digested food. SEEK IMMEDIATE MEDICAL CARE IF:  You are unable to swallow your own saliva .  You are having shortness of breath or a fever, or both.  You have a hoarse voice along with difficulty swallowing. MAKE SURE YOU:  Understand these instructions.  Will watch your condition.  Will get help right away if you are not doing well or get worse. Document Released: 05/07/2000 Document Revised: 09/24/2013 Document Reviewed: 10/27/2012 [employer] Patient Information 2015 [employer]. This information is not intended to replace advice given to you by your health care provider. Make sure you discuss any questions you have with your health care provider.

## 2014-11-22 DIAGNOSIS — R1310 Dysphagia, unspecified: Secondary | ICD-10-CM

## 2014-11-22 HISTORY — DX: Dysphagia, unspecified: R13.10

## 2014-11-29 ENCOUNTER — Other Ambulatory Visit: Payer: Self-pay | Admitting: Physician Assistant

## 2014-11-29 DIAGNOSIS — R1310 Dysphagia, unspecified: Secondary | ICD-10-CM

## 2014-12-03 ENCOUNTER — Ambulatory Visit
Admission: RE | Admit: 2014-12-03 | Discharge: 2014-12-03 | Disposition: A | Discharge status: Home / Self Care | Payer: Medicaid Other | Source: Ambulatory Visit | Attending: Physician Assistant | Admitting: Physician Assistant

## 2014-12-03 DIAGNOSIS — R1310 Dysphagia, unspecified: Secondary | ICD-10-CM

## 2015-04-15 DIAGNOSIS — B2790 Infectious mononucleosis, unspecified without complication: Secondary | ICD-10-CM

## 2015-04-15 HISTORY — DX: Infectious mononucleosis, unspecified without complication: B27.90

## 2015-04-24 DIAGNOSIS — R17 Unspecified jaundice: Secondary | ICD-10-CM

## 2015-04-24 HISTORY — DX: Unspecified jaundice: R17

## 2015-04-28 ENCOUNTER — Encounter (HOSPITAL_COMMUNITY): Payer: Self-pay | Admitting: Emergency Medicine

## 2015-04-28 ENCOUNTER — Inpatient Hospital Stay (HOSPITAL_COMMUNITY)
Admission: EM | Admit: 2015-04-28 | Discharge: 2015-04-30 | DRG: 442 | Disposition: A | Discharge status: Home / Self Care | Payer: Medicaid Other | Attending: Internal Medicine | Admitting: Internal Medicine

## 2015-04-28 DIAGNOSIS — R7989 Other specified abnormal findings of blood chemistry: Secondary | ICD-10-CM | POA: Diagnosis not present

## 2015-04-28 DIAGNOSIS — F17210 Nicotine dependence, cigarettes, uncomplicated: Secondary | ICD-10-CM | POA: Diagnosis present

## 2015-04-28 DIAGNOSIS — K7200 Acute and subacute hepatic failure without coma: Secondary | ICD-10-CM | POA: Diagnosis not present

## 2015-04-28 DIAGNOSIS — K7030 Alcoholic cirrhosis of liver without ascites: Secondary | ICD-10-CM | POA: Diagnosis not present

## 2015-04-28 DIAGNOSIS — Z6830 Body mass index (BMI) 30.0-30.9, adult: Secondary | ICD-10-CM

## 2015-04-28 DIAGNOSIS — D538 Other specified nutritional anemias: Secondary | ICD-10-CM | POA: Diagnosis not present

## 2015-04-28 DIAGNOSIS — B179 Acute viral hepatitis, unspecified: Principal | ICD-10-CM | POA: Diagnosis present

## 2015-04-28 DIAGNOSIS — E876 Hypokalemia: Secondary | ICD-10-CM | POA: Diagnosis present

## 2015-04-28 DIAGNOSIS — R162 Hepatomegaly with splenomegaly, not elsewhere classified: Secondary | ICD-10-CM | POA: Diagnosis not present

## 2015-04-28 DIAGNOSIS — F1010 Alcohol abuse, uncomplicated: Secondary | ICD-10-CM | POA: Diagnosis present

## 2015-04-28 DIAGNOSIS — B2790 Infectious mononucleosis, unspecified without complication: Secondary | ICD-10-CM | POA: Diagnosis present

## 2015-04-28 DIAGNOSIS — E8809 Other disorders of plasma-protein metabolism, not elsewhere classified: Secondary | ICD-10-CM | POA: Diagnosis not present

## 2015-04-28 DIAGNOSIS — I10 Essential (primary) hypertension: Secondary | ICD-10-CM | POA: Diagnosis present

## 2015-04-28 DIAGNOSIS — Z7984 Long term (current) use of oral hypoglycemic drugs: Secondary | ICD-10-CM

## 2015-04-28 DIAGNOSIS — K766 Portal hypertension: Secondary | ICD-10-CM | POA: Diagnosis present

## 2015-04-28 DIAGNOSIS — D649 Anemia, unspecified: Secondary | ICD-10-CM | POA: Diagnosis present

## 2015-04-28 DIAGNOSIS — D696 Thrombocytopenia, unspecified: Secondary | ICD-10-CM | POA: Diagnosis present

## 2015-04-28 DIAGNOSIS — R160 Hepatomegaly, not elsewhere classified: Secondary | ICD-10-CM | POA: Diagnosis not present

## 2015-04-28 DIAGNOSIS — F329 Major depressive disorder, single episode, unspecified: Secondary | ICD-10-CM | POA: Diagnosis present

## 2015-04-28 DIAGNOSIS — R1310 Dysphagia, unspecified: Secondary | ICD-10-CM | POA: Diagnosis present

## 2015-04-28 DIAGNOSIS — R740 Nonspecific elevation of levels of transaminase and lactic acid dehydrogenase [LDH]: Secondary | ICD-10-CM | POA: Diagnosis not present

## 2015-04-28 DIAGNOSIS — D689 Coagulation defect, unspecified: Secondary | ICD-10-CM | POA: Diagnosis present

## 2015-04-28 DIAGNOSIS — F319 Bipolar disorder, unspecified: Secondary | ICD-10-CM | POA: Diagnosis present

## 2015-04-28 DIAGNOSIS — E440 Moderate protein-calorie malnutrition: Secondary | ICD-10-CM | POA: Diagnosis present

## 2015-04-28 DIAGNOSIS — I959 Hypotension, unspecified: Secondary | ICD-10-CM | POA: Diagnosis present

## 2015-04-28 DIAGNOSIS — F419 Anxiety disorder, unspecified: Secondary | ICD-10-CM | POA: Diagnosis present

## 2015-04-28 DIAGNOSIS — R935 Abnormal findings on diagnostic imaging of other abdominal regions, including retroperitoneum: Secondary | ICD-10-CM | POA: Diagnosis not present

## 2015-04-28 DIAGNOSIS — E119 Type 2 diabetes mellitus without complications: Secondary | ICD-10-CM | POA: Diagnosis present

## 2015-04-28 DIAGNOSIS — Z79899 Other long term (current) drug therapy: Secondary | ICD-10-CM | POA: Diagnosis not present

## 2015-04-28 DIAGNOSIS — E871 Hypo-osmolality and hyponatremia: Secondary | ICD-10-CM | POA: Diagnosis present

## 2015-04-28 DIAGNOSIS — Z720 Tobacco use: Secondary | ICD-10-CM | POA: Diagnosis present

## 2015-04-28 DIAGNOSIS — R600 Localized edema: Secondary | ICD-10-CM | POA: Diagnosis not present

## 2015-04-28 DIAGNOSIS — R1111 Vomiting without nausea: Secondary | ICD-10-CM

## 2015-04-28 DIAGNOSIS — R0600 Dyspnea, unspecified: Secondary | ICD-10-CM | POA: Diagnosis not present

## 2015-04-28 HISTORY — DX: Other specified anxiety disorders: F41.8

## 2015-04-28 HISTORY — DX: Dysphagia, unspecified: R13.10

## 2015-04-28 HISTORY — DX: Unspecified jaundice: R17

## 2015-04-28 HISTORY — DX: Infectious mononucleosis, unspecified without complication: B27.90

## 2015-04-28 LAB — COMPREHENSIVE METABOLIC PANEL
ALT: 22 U/L (ref 14–54)
AST: 91 U/L — AB (ref 15–41)
Albumin: 2.5 g/dL — ABNORMAL LOW (ref 3.5–5.0)
Alkaline Phosphatase: 165 U/L — ABNORMAL HIGH (ref 38–126)
Anion gap: 9 (ref 5–15)
BILIRUBIN TOTAL: 5.9 mg/dL — AB (ref 0.3–1.2)
CHLORIDE: 100 mmol/L — AB (ref 101–111)
CO2: 22 mmol/L (ref 22–32)
Calcium: 8.3 mg/dL — ABNORMAL LOW (ref 8.9–10.3)
Creatinine, Ser: 0.55 mg/dL (ref 0.44–1.00)
GFR calc Af Amer: >60 mL/min (ref >60)
GFR calc non Af Amer: >60 mL/min (ref >60)
Glucose, Bld: 140 mg/dL — ABNORMAL HIGH (ref 65–99)
Potassium: 3.3 mmol/L — ABNORMAL LOW (ref 3.5–5.1)
Sodium: 131 mmol/L — ABNORMAL LOW (ref 135–145)
Total Protein: 6.7 g/dL (ref 6.5–8.1)

## 2015-04-28 LAB — ETHANOL

## 2015-04-28 MED ORDER — PIPERACILLIN-TAZOBACTAM 3.375 G IVPB
3.3750 g | Freq: Three times a day (TID) | INTRAVENOUS | Status: DC
  Administered 2015-04-29 (×2): 3.375 g via INTRAVENOUS
  Filled 2015-04-28 (×3): qty 50

## 2015-04-28 MED ORDER — MORPHINE SULFATE (PF) 2 MG/ML IV SOLN: 1.0000 mg | INTRAVENOUS | Status: DC | PRN

## 2015-04-28 MED ORDER — PANTOPRAZOLE SODIUM 40 MG IV SOLR
40.0000 mg | INTRAVENOUS | Status: DC
  Administered 2015-04-28 – 2015-04-29 (×2): 40 mg via INTRAVENOUS
  Filled 2015-04-28 (×3): qty 40

## 2015-04-28 MED ORDER — ENOXAPARIN SODIUM 40 MG/0.4ML ~~LOC~~ SOLN
40.0000 mg | SUBCUTANEOUS | Status: DC
  Administered 2015-04-28 – 2015-04-29 (×2): 40 mg via SUBCUTANEOUS
  Filled 2015-04-28 (×4): qty 0.4

## 2015-04-28 MED ORDER — SODIUM CHLORIDE 0.9 % IV BOLUS (SEPSIS)
1000.0000 mL | Freq: Once | INTRAVENOUS | Status: AC
  Administered 2015-04-28: 1000 mL via INTRAVENOUS

## 2015-04-28 MED ORDER — ONDANSETRON HCL 4 MG/2ML IJ SOLN
4.0000 mg | Freq: Four times a day (QID) | INTRAMUSCULAR | Status: DC | PRN
  Administered 2015-04-29: 4 mg via INTRAVENOUS
  Filled 2015-04-28: qty 2

## 2015-04-28 MED ORDER — DEXTROSE-NACL 5-0.45 % IV SOLN
INTRAVENOUS | Status: DC
  Administered 2015-04-28: 23:00:00 via INTRAVENOUS

## 2015-04-28 NOTE — Progress Notes (Signed)
This info entered in pt d/c instructions [facility] Schedule an appointment as soon as possible for a visit This is your assigned Medicaid [place] access doctor If you prefer another contact DSS [phone] DSS assigned your doctor *You may receive a bill if you go to any family Dr not assigned to you As of 04/28/15  Your recent [employer] [place] access response verification shows [facility] [address] [place], [place] [phone] Medicaid [place] Access Covered Patient USE THIS WEBSITE TO ASSIST WITH UNDERSTANDING YOUR COVERAGE, RENEW APPLICATION [place] Medicaid Transportation to Dr appts if you are have full Medicaid: [phone], [phone] or [phone] Transportation Supervisor [phone] As a Medicaid client you MUST contact DSS/SSI each time you change address, move to another [place] or another state to keep your address updated [place]: [phone] [address] [place], [place] [id number] [email or web address]

## 2015-04-28 NOTE — Progress Notes (Signed)
ANTIBIOTIC CONSULT NOTE - INITIAL  Pharmacy Consult for Zosyn Indication: Intra-abdominal infection  No Known Allergies  Patient Measurements:   Wt=81 kg  Vital Signs: Temp: 99.6 F (37.6 C) (12/05 2213) Temp Source: Oral (12/05 2213) BP: 106/53 mmHg (12/05 2213) Pulse Rate: 117 (12/05 2213) Intake/Output from previous day:   Intake/Output from this shift:    Labs:  Recent Labs  04/28/15 2055  CREATININE 0.55   CrCl cannot be calculated (Unknown ideal weight.). No results for input(s): VANCOTROUGH, VANCOPEAK, VANCORANDOM, GENTTROUGH, GENTPEAK, GENTRANDOM, TOBRATROUGH, TOBRAPEAK, TOBRARND, AMIKACINPEAK, AMIKACINTROU, AMIKACIN in the last 72 hours.   Microbiology: No results found for this or any previous visit (from the past 720 hour(s)).  Medical History: Past Medical History  Diagnosis Date  . Bipolar disorder (HCC)   . Anxiety   . Depression   . Diabetes mellitus   . Hypertension     Medications:  Prescriptions prior to admission  Medication Sig Dispense Refill Last Dose  . acetaminophen (TYLENOL) 160 MG/5ML liquid Take 500 mg by mouth every 4 (four) hours as needed for fever or pain.   Past Week at Unknown time  . ARIPiprazole (ABILIFY) 5 MG tablet Take 5 mg by mouth daily.  0 04/27/2015 at Unknown time  . cholecalciferol (VITAMIN D) 1000 UNITS tablet Take 1,000 Units by mouth daily.   unknown  . citalopram (CELEXA) 10 MG/5ML suspension Take 20 mLs by mouth every evening.  1 04/27/2015 at Unknown time  . dicyclomine (BENTYL) 10 MG/5ML syrup Take 10 mLs by mouth 4 (four) times daily.   Past Month at Unknown time  . diphenhydrAMINE (BENADRYL) 12.5 MG/5ML elixir Take 25 mg by mouth 4 (four) times daily as needed for itching or allergies.   2 weeks  . LamoTRIgine 200 MG TBDP Take 400 mg by mouth at bedtime.   04/27/2015 at Unknown time  . lansoprazole (PREVACID SOLUTAB) 30 MG disintegrating tablet Take 30 mg by mouth 2 (two) times daily.   04/28/2015 at Unknown time   . lisinopril (PRINIVIL,ZESTRIL) 20 MG tablet Take 20 mg by mouth daily.   04/28/2015 at Unknown time  . LORazepam (ATIVAN) 1 MG tablet Take 1 mg by mouth 3 (three) times daily as needed for anxiety.    04/28/2015 at Unknown time  . metFORMIN (GLUCOPHAGE) 1000 MG tablet Take 1,000 mg by mouth 2 (two) times daily.   Past Week at Unknown time  . pravastatin (PRAVACHOL) 40 MG tablet Take 40 mg by mouth daily.   04/27/2015 at Unknown time  . sucralfate (CARAFATE) 1 GM/10ML suspension Take 10 mLs by mouth 4 (four) times daily.   04/28/2015 at Unknown time  . vitamin B-12 (CYANOCOBALAMIN) 1000 MCG tablet Take 1,000 mcg by mouth daily.   04/28/2015 at Unknown time   Scheduled:  . enoxaparin (LOVENOX) injection  40 mg Subcutaneous Q24H  . pantoprazole (PROTONIX) IV  40 mg Intravenous Q24H  . piperacillin-tazobactam (ZOSYN)  IV  3.375 g Intravenous Q8H   Infusions:  . dextrose 5 % and 0.45% NaCl 125 mL/hr at 04/28/15 2321   Assessment: 49 yoF admitted with hepatospenomegaly.  Zosyn per Rx for [facility].  Goal of Therapy:  Treat infection  Plan:   Zosyn 3.375 Gm IV q8h EI  F/u SCr/cultures  Susanne GreenhouseGreen, Kaivon Livesey R 04/28/2015,11:59 PM

## 2015-04-28 NOTE — ED Notes (Signed)
REPORT GIVEN TO Leanne ChangQUEEN, RN [phone]. AAOX3. PT IN NO APPARENT DISTRESS OR PAIN. THE OPPORTUNITY TO ASK QUESTIONS WAS PROVIDED. WILL TRANSPORT PT TO THE FLOOR.

## 2015-04-28 NOTE — ED Provider Notes (Signed)
CSN: [id number]     Arrival date & time 04/28/15  1719 History   First MD Initiated Contact with Patient 04/28/15 1820     Chief Complaint  Patient presents with  . Enlarged spleen   . Enlarged liver     (Consider location/radiation/quality/duration/timing/severity/associated sxs/prior Treatment) The history is provided by the patient and a friend. No language interpreter was used.  Katrina Cohen is a 49 year old female with a history of bipolar disorder, anxiety, depression, diabetes, and hypertension who was sent here for direct admit from [facility] regional physicians office in [facility] by Deneen Harts for hepatosplenomegaly found on ultrasound after patient complained of abdominal pain. She was diagnosed with mono 10 days ago. She is complaining of intermittent fever (taking tylenol) and vomiting. Denies diarrhea or constipation.   Past Medical History  Diagnosis Date  . Bipolar disorder (HCC)   . Anxiety   . Depression   . Diabetes mellitus   . Hypertension    Past Surgical History  Procedure Laterality Date  . Cesarean section    . Appendectomy    . Colonoscopy     Family History  Problem Relation Age of Onset  . Diabetes Mother   . Cancer Father   . Diabetes Sister   . Diabetes Brother    Social History  Substance Use Topics  . Smoking status: Current Every Day Smoker -- 1.00 packs/day    Types: Cigarettes  . Smokeless tobacco: None  . Alcohol Use: Yes     Comment: occasionally   OB History    No data available     Review of Systems  Constitutional: Positive for fever.  Gastrointestinal: Positive for vomiting and abdominal pain. Negative for nausea.  All other systems reviewed and are negative.     Allergies  Review of patient's allergies indicates no known allergies.  Home Medications   Prior to Admission medications   Medication Sig Start Date End Date Taking? Authorizing Provider  acetaminophen (TYLENOL) 160 MG/5ML liquid Take 500 mg by mouth every 4  (four) hours as needed for fever or pain.   Yes Historical Provider, MD  ARIPiprazole (ABILIFY) 5 MG tablet Take 5 mg by mouth daily. 04/02/15  Yes Historical Provider, MD  cholecalciferol (VITAMIN D) 1000 UNITS tablet Take 1,000 Units by mouth daily.   Yes Historical Provider, MD  citalopram (CELEXA) 10 MG/5ML suspension Take 20 mLs by mouth every evening. 04/12/15  Yes Historical Provider, MD  dicyclomine (BENTYL) 10 MG/5ML syrup Take 10 mLs by mouth 4 (four) times daily. 03/12/15  Yes Historical Provider, MD  diphenhydrAMINE (BENADRYL) 12.5 MG/5ML elixir Take 25 mg by mouth 4 (four) times daily as needed for itching or allergies.   Yes Historical Provider, MD  LamoTRIgine 200 MG TBDP Take 400 mg by mouth at bedtime. 12/19/14  Yes Historical Provider, MD  lansoprazole (PREVACID SOLUTAB) 30 MG disintegrating tablet Take 30 mg by mouth 2 (two) times daily. 04/24/15 04/23/16 Yes Historical Provider, MD  lisinopril (PRINIVIL,ZESTRIL) 20 MG tablet Take 20 mg by mouth daily. 12/26/14  Yes Historical Provider, MD  LORazepam (ATIVAN) 1 MG tablet Take 1 mg by mouth 3 (three) times daily as needed for anxiety.    Yes Historical Provider, MD  metFORMIN (GLUCOPHAGE) 1000 MG tablet Take 1,000 mg by mouth 2 (two) times daily. 12/24/14  Yes Historical Provider, MD  pravastatin (PRAVACHOL) 40 MG tablet Take 40 mg by mouth daily. 01/06/15  Yes Historical Provider, MD  sucralfate (CARAFATE) 1 GM/10ML suspension Take 10  mLs by mouth 4 (four) times daily. 04/09/15 05/09/15 Yes Historical Provider, MD  vitamin B-12 (CYANOCOBALAMIN) 1000 MCG tablet Take 1,000 mcg by mouth daily.   Yes Historical Provider, MD   BP 106/53 mmHg  Pulse 117  Temp(Src) 99.6 F (37.6 C) (Oral)  Resp 20  SpO2 98% Physical Exam  Constitutional: She is oriented to person, place, and time. Vital signs are normal. She appears well-developed and well-nourished.  HENT:  Head: Normocephalic and atraumatic.  Eyes: Conjunctivae are normal.  Neck:  Normal range of motion. Neck supple.  Cardiovascular: Normal rate, regular rhythm and normal heart sounds.   Pulmonary/Chest: Effort normal and breath sounds normal. No respiratory distress. She has no wheezes. She has no rales.  Abdominal: Soft. She exhibits no distension. There is hepatosplenomegaly. There is tenderness in the epigastric area, left upper quadrant and left lower quadrant. There is no rebound and no guarding.    Left-sided and epigastric abdominal tenderness to palpation. He paddles splenomegaly. No abdominal distention.  No CVA tenderness.  Musculoskeletal: Normal range of motion.  Neurological: She is alert and oriented to person, place, and time.  Skin: Skin is warm and dry.  Nursing note and vitals reviewed.   ED Course  Procedures (including critical care time) Labs Review Labs Reviewed  COMPREHENSIVE METABOLIC PANEL - Abnormal; Notable for the following:    Sodium 131 (*)    Potassium 3.3 (*)    Chloride 100 (*)    Glucose, Bld 140 (*)    BUN <5 (*)    Calcium 8.3 (*)    Albumin 2.5 (*)    AST 91 (*)    Alkaline Phosphatase 165 (*)    Total Bilirubin 5.9 (*)    All other components within normal limits  ETHANOL  HEPATITIS PANEL, ACUTE  COMPREHENSIVE METABOLIC PANEL  CBC WITH DIFFERENTIAL/PLATELET  HIV ANTIBODY (ROUTINE TESTING)    Imaging Review No results found. I have personally reviewed and evaluated these [facility] and lab results as part of my medical decision-making.   EKG Interpretation None      MDM   Final diagnoses:  Non-intractable vomiting without nausea, vomiting of unspecified type  Alcoholic cirrhosis of liver without ascites (HCC)  Mononucleosis  Patient presents for abdominal pain, vomiting, fever with recent diagnosis of alcoholic cirrhosis. She is well appearing but tachycardic on exam.  Ultrasound of abdomen from earlier today shows: Coarse echotexture of the liver with hepatomegaly. Findings suggest  hepatocellular disease and possible cirrhosis. Splenomegaly most likely due to portal hypertension. Negative for gallstones. Gallbladder wall mildly thickened likely due to liver disease.  I spoke to Deneen HartsElizabeth Todd NP, who is her primary care provider, and stated the patient came in for vomiting, dark urine, and recent diagnosis of mono. Patient states she is down to 4 beers a day from about 10. She obtained an ultrasound of her abdomen which showed cirrhosis. She thinks this may be due to her excessive alcohol use. She spoke to Dr. Norma Fredricksonoledo with gastroenterology who stated the patient needed to come in for alcoholic cirrhosis. Recheck:  Patient agreeable to plan.  She has had no vomiting in the ED.  I spoke to the hospitalist.  Ddx: ascending cholangitis. He will see the patient and possibly start antibiotics.  She will need admission to tele due to tachycardia.      Katrina GosselinHanna Patel-Mills, PA-C 04/28/15 2327  Katrina Pulleyaniel Knott, MD 04/29/15 [phone]

## 2015-04-28 NOTE — ED Notes (Signed)
Pt was sent here [facility] by Deneen HartsElizabeth Todd. Diagnosed with enlarged spleen and enlarged liver. Had labs drawn at the physician's office. No other c/c. [facility] more information regarding current situation.

## 2015-04-28 NOTE — H&P (Addendum)
History and Physical  Katrina Cohen ZOX:[id number] DOB: Jul 23, 1965 DOA: 04/28/2015  PCP: PROVIDER NOT IN SYSTEM   Chief Complaint: hepatosplenomegaly   History of Present Illness:  - Patient is a 49 yo female with history of HTN, DM, Bipolar disorder who was diagnosed with infectious mononucleosis 2 weeks ago ( EBV IgM + ) after presenting with symptoms of sore throat, cervical lymphadenopathy, weakness, fever. - She was on supportive care and did well until last Saturday when she started noticing jaundice, having fever/chills and worsening abdominal pain in RUQ area, associated with vomiting and difficulty swallowing. She also had loss of appetite and worsening fatigue/weakness.  - She had an abdominal US today in clinic that showed hepatosplenomegaly suggestive liver disease, possible cirrhosis with portal hypertension. She also had elevated TB and abnormal LFTs. She was sent to the ER. GI was consulted who asked the patient get admitted to hospitalist service for monitoring.   Review of Systems:  CONSTITUTIONAL:  +night sweats.  +fatigue, + fever +chills. Eyes:  No visual changes.  No eye pain.  No eye discharge.   ENT:    No epistaxis.  No sinus pain.  No sore throat.  No ear pain.  No congestion. RESPIRATORY:  +cough.  No wheeze.  No hemoptysis.  No shortness of breath. CARDIOVASCULAR:  No chest pains.  No palpitations. GASTROINTESTINAL:  +abdominal pain.  +nausea +vomiting.  +diarrhea .  No hematemesis.  No hematochezia.  No melena. GENITOURINARY:  No urgency.  No frequency.  No dysuria.  No hematuria.  No obstructive symptoms.  No discharge.  No pain.  No significant abnormal bleeding. MUSCULOSKELETAL:  No musculoskeletal pain.  No joint swelling.  No arthritis. NEUROLOGICAL:  No confusion.  +weakness. No headache. No seizure. PSYCHIATRIC:  No depression. No anxiety. No suicidal ideation. SKIN:  No rashes.  No lesions.  No wounds. ENDOCRINE:  No unexplained weight loss.  No  polydipsia.  No polyuria.  No polyphagia. HEMATOLOGIC:  No anemia.  No purpura.  No petechiae.  No bleeding.  ALLERGIC AND IMMUNOLOGIC:  No pruritus.  No swelling Other:  Past Medical and Surgical History:   Past Medical History  Diagnosis Date  . Bipolar disorder (HCC)   . Anxiety   . Depression   . Diabetes mellitus   . Hypertension    Past Surgical History  Procedure Laterality Date  . Cesarean section    . Appendectomy    . Colonoscopy      Social History:   reports that she has been smoking Cigarettes.  She has been smoking about 1.00 pack per day. She does not have any smokeless tobacco history on file. She reports that she drinks alcohol. She reports that she does not use illicit drugs.  No Known Allergies  Family History  Problem Relation Age of Onset  . Diabetes Mother   . Cancer Father   . Diabetes Sister   . Diabetes Brother       Prior to Admission medications   Medication Sig Start Date End Date Taking? Authorizing Provider  acetaminophen (TYLENOL) 160 MG/5ML liquid Take 500 mg by mouth every 4 (four) hours as needed for fever or pain.   Yes Historical Provider, MD  ARIPiprazole (ABILIFY) 5 MG tablet Take 5 mg by mouth daily. 04/02/15  Yes Historical Provider, MD  cholecalciferol (VITAMIN D) 1000 UNITS tablet Take 1,000 Units by mouth daily.   Yes Historical Provider, MD  citalopram (CELEXA) 10 MG/5ML suspension Take 20 mLs by mouth every evening.  04/12/15  Yes Historical Provider, MD  dicyclomine (BENTYL) 10 MG/5ML syrup Take 10 mLs by mouth 4 (four) times daily. 03/12/15  Yes Historical Provider, MD  diphenhydrAMINE (BENADRYL) 12.5 MG/5ML elixir Take 25 mg by mouth 4 (four) times daily as needed for itching or allergies.   Yes Historical Provider, MD  LamoTRIgine 200 MG TBDP Take 400 mg by mouth at bedtime. 12/19/14  Yes Historical Provider, MD  lansoprazole (PREVACID SOLUTAB) 30 MG disintegrating tablet Take 30 mg by mouth 2 (two) times daily. 04/24/15  04/23/16 Yes Historical Provider, MD  lisinopril (PRINIVIL,ZESTRIL) 20 MG tablet Take 20 mg by mouth daily. 12/26/14  Yes Historical Provider, MD  LORazepam (ATIVAN) 1 MG tablet Take 1 mg by mouth 3 (three) times daily as needed for anxiety.    Yes Historical Provider, MD  metFORMIN (GLUCOPHAGE) 1000 MG tablet Take 1,000 mg by mouth 2 (two) times daily. 12/24/14  Yes Historical Provider, MD  pravastatin (PRAVACHOL) 40 MG tablet Take 40 mg by mouth daily. 01/06/15  Yes Historical Provider, MD  sucralfate (CARAFATE) 1 GM/10ML suspension Take 10 mLs by mouth 4 (four) times daily. 04/09/15 05/09/15 Yes Historical Provider, MD  vitamin B-12 (CYANOCOBALAMIN) 1000 MCG tablet Take 1,000 mcg by mouth daily.   Yes Historical Provider, MD    Physical Exam: BP 110/56 mmHg  Pulse 112  Temp(Src) 98.5 F (36.9 C) (Oral)  Resp 20  SpO2 96%  GENERAL : Well developed, well nourished, alert and cooperative, and appears to be in mild acute distress. HEAD: normocephalic. EYES: PERRL, EOMI.  EARS: hearing grossly intact. NOSE: No nasal discharge. THROAT: Oral cavity and pharynx normal. No inflammation, swelling, exudate, or lesions.  NECK: Neck supple CARDIAC: Normal S1 and S2. No S3, S4 or murmurs. Rhythm is regular. There is no peripheral edema, cyanosis or pallor.  LUNGS: Clear to auscultation and percussion without rales, rhonchi, wheezing or diminished breath sounds. ABDOMEN: Positive bowel sounds. Soft, distended, tender to palpation in epigastric/RUQ area. EXTREMITIES: No significant deformity or joint abnormality. No edema.  NEUROLOGICAL: The mental examination revealed the patient was oriented to person, place, and time.CN II-XII intact.  SKIN: Skin normal color PSYCHIATRIC:  The patient was able to demonstrate good judgement and reason, without hallucinations, abnormal affect or abnormal behaviors during the examination. Patient is not suicidal.          Labs on Admission:  Reviewed.    Radiological Exams on Admission: No results found.   Assessment/Plan  Infectious mononucleosis:  Liver US is negative for CBD dilation ( unlikely ascending cholangitis) Will recheck CMP Continue supportive care Check hepatitis panel GI consult in am Will check HIV Ab Liver abnormalities likely due to mono. Will give one dose Zosyn and check bcx to r/o cholangitis despite neg CBD dilation on US from outside clinic, awaiting GI input in am. Likely will need supportive care mainly.   HTN:  Hold meds  DM:  Hold PO meds. Placed on low dose correction insulin.    DVT prophylaxis: [place] enoxaparin  GI prophylaxis: PPI IV Consultants: GI Code Status: Full   Eston EstersAhmad Finis Hendricksen M.D [facility]

## 2015-04-29 ENCOUNTER — Encounter (HOSPITAL_COMMUNITY): Payer: Self-pay | Admitting: Physician Assistant

## 2015-04-29 DIAGNOSIS — E440 Moderate protein-calorie malnutrition: Secondary | ICD-10-CM | POA: Diagnosis present

## 2015-04-29 DIAGNOSIS — R7989 Other specified abnormal findings of blood chemistry: Secondary | ICD-10-CM

## 2015-04-29 DIAGNOSIS — E119 Type 2 diabetes mellitus without complications: Secondary | ICD-10-CM

## 2015-04-29 DIAGNOSIS — B2790 Infectious mononucleosis, unspecified without complication: Secondary | ICD-10-CM

## 2015-04-29 DIAGNOSIS — E871 Hypo-osmolality and hyponatremia: Secondary | ICD-10-CM

## 2015-04-29 DIAGNOSIS — R600 Localized edema: Secondary | ICD-10-CM

## 2015-04-29 DIAGNOSIS — R740 Nonspecific elevation of levels of transaminase and lactic acid dehydrogenase [LDH]: Secondary | ICD-10-CM

## 2015-04-29 DIAGNOSIS — R160 Hepatomegaly, not elsewhere classified: Secondary | ICD-10-CM

## 2015-04-29 DIAGNOSIS — D649 Anemia, unspecified: Secondary | ICD-10-CM | POA: Diagnosis present

## 2015-04-29 DIAGNOSIS — E876 Hypokalemia: Secondary | ICD-10-CM

## 2015-04-29 DIAGNOSIS — D689 Coagulation defect, unspecified: Secondary | ICD-10-CM | POA: Diagnosis present

## 2015-04-29 DIAGNOSIS — E8809 Other disorders of plasma-protein metabolism, not elsewhere classified: Secondary | ICD-10-CM | POA: Diagnosis present

## 2015-04-29 DIAGNOSIS — D538 Other specified nutritional anemias: Secondary | ICD-10-CM

## 2015-04-29 DIAGNOSIS — R935 Abnormal findings on diagnostic imaging of other abdominal regions, including retroperitoneum: Secondary | ICD-10-CM

## 2015-04-29 DIAGNOSIS — K7030 Alcoholic cirrhosis of liver without ascites: Secondary | ICD-10-CM

## 2015-04-29 LAB — PROTIME-INR
INR: 1.56 — AB (ref 0.00–1.49)
PROTHROMBIN TIME: 18.7 s — AB (ref 11.6–15.2)

## 2015-04-29 LAB — CBC WITH DIFFERENTIAL/PLATELET
BASOS PCT: 0 %
Basophils Absolute: 0 10*3/uL (ref 0.0–0.1)
EOS ABS: 0 10*3/uL (ref 0.0–0.7)
Eosinophils Relative: 0 %
HCT: 29.9 % — ABNORMAL LOW (ref 36.0–46.0)
HEMOGLOBIN: 9.7 g/dL — AB (ref 12.0–15.0)
Lymphocytes Relative: 19 %
Lymphs Abs: 1.4 10*3/uL (ref 0.7–4.0)
MCH: 30.4 pg (ref 26.0–34.0)
MCHC: 32.4 g/dL (ref 30.0–36.0)
MCV: 93.7 fL (ref 78.0–100.0)
Monocytes Absolute: 0.6 10*3/uL (ref 0.1–1.0)
Monocytes Relative: 9 %
NEUTROS PCT: 72 %
Neutro Abs: 5.5 10*3/uL (ref 1.7–7.7)
Platelets: 126 10*3/uL — ABNORMAL LOW (ref 150–400)
RBC: 3.19 MIL/uL — AB (ref 3.87–5.11)
RDW: 16.6 % — ABNORMAL HIGH (ref 11.5–15.5)
WBC: 7.6 10*3/uL (ref 4.0–10.5)

## 2015-04-29 LAB — URINE MICROSCOPIC-ADD ON

## 2015-04-29 LAB — URINALYSIS, ROUTINE W REFLEX MICROSCOPIC
GLUCOSE, UA: NEGATIVE mg/dL
HGB URINE DIPSTICK: NEGATIVE
KETONES UR: NEGATIVE mg/dL
NITRITE: POSITIVE — AB
PH: 5.5 (ref 5.0–8.0)
Protein, ur: 30 mg/dL — AB
Specific Gravity, Urine: 1.029 (ref 1.005–1.030)

## 2015-04-29 LAB — ACETAMINOPHEN LEVEL: Acetaminophen (Tylenol), Serum: <10 ug/mL — ABNORMAL LOW (ref 10–30)

## 2015-04-29 LAB — BILIRUBIN, DIRECT: Bilirubin, Direct: 2.9 mg/dL — ABNORMAL HIGH (ref 0.1–0.5)

## 2015-04-29 LAB — COMPREHENSIVE METABOLIC PANEL
ALK PHOS: 158 U/L — AB (ref 38–126)
ALT: 19 U/L (ref 14–54)
AST: 79 U/L — ABNORMAL HIGH (ref 15–41)
Albumin: 2.2 g/dL — ABNORMAL LOW (ref 3.5–5.0)
Anion gap: 7 (ref 5–15)
BUN: <5 mg/dL — ABNORMAL LOW (ref 6–20)
CALCIUM: 7.9 mg/dL — AB (ref 8.9–10.3)
CO2: 22 mmol/L (ref 22–32)
CREATININE: 0.46 mg/dL (ref 0.44–1.00)
Chloride: 101 mmol/L (ref 101–111)
Glucose, Bld: 199 mg/dL — ABNORMAL HIGH (ref 65–99)
Potassium: 3.2 mmol/L — ABNORMAL LOW (ref 3.5–5.1)
Sodium: 130 mmol/L — ABNORMAL LOW (ref 135–145)
Total Bilirubin: 5.5 mg/dL — ABNORMAL HIGH (ref 0.3–1.2)
Total Protein: 6.2 g/dL — ABNORMAL LOW (ref 6.5–8.1)

## 2015-04-29 LAB — HIV ANTIBODY (ROUTINE TESTING W REFLEX): HIV SCREEN 4TH GENERATION: NONREACTIVE

## 2015-04-29 LAB — APTT: APTT: 48 s — AB (ref 24–37)

## 2015-04-29 LAB — GLUCOSE, CAPILLARY
GLUCOSE-CAPILLARY: 137 mg/dL — AB (ref 65–99)
Glucose-Capillary: 119 mg/dL — ABNORMAL HIGH (ref 65–99)

## 2015-04-29 MED ORDER — POTASSIUM CHLORIDE CRYS ER 20 MEQ PO TBCR: 40.0000 meq | EXTENDED_RELEASE_TABLET | Freq: Once | ORAL | Status: DC

## 2015-04-29 MED ORDER — THIAMINE HCL 100 MG/ML IJ SOLN
100.0000 mg | Freq: Every day | INTRAMUSCULAR | Status: DC
  Filled 2015-04-29 (×2): qty 1

## 2015-04-29 MED ORDER — LORAZEPAM 1 MG PO TABS: 1.0000 mg | ORAL_TABLET | Freq: Four times a day (QID) | ORAL | Status: DC | PRN

## 2015-04-29 MED ORDER — CHLORHEXIDINE GLUCONATE 0.12 % MT SOLN
15.0000 mL | Freq: Two times a day (BID) | OROMUCOSAL | Status: DC
  Filled 2015-04-29 (×4): qty 15

## 2015-04-29 MED ORDER — LORAZEPAM 2 MG/ML IJ SOLN
1.0000 mg | Freq: Once | INTRAMUSCULAR | Status: AC
  Administered 2015-04-29: 1 mg via INTRAVENOUS
  Filled 2015-04-29: qty 1

## 2015-04-29 MED ORDER — LORAZEPAM 2 MG/ML IJ SOLN
1.0000 mg | Freq: Four times a day (QID) | INTRAMUSCULAR | Status: DC | PRN
Start: 2015-04-29 — End: 2015-04-30
  Administered 2015-04-29: 1 mg via INTRAVENOUS
  Filled 2015-04-29: qty 1

## 2015-04-29 MED ORDER — CETYLPYRIDINIUM CHLORIDE 0.05 % MT LIQD
7.0000 mL | Freq: Two times a day (BID) | OROMUCOSAL | Status: DC
  Administered 2015-04-30: 7 mL via OROMUCOSAL

## 2015-04-29 MED ORDER — FOLIC ACID 1 MG PO TABS
1.0000 mg | ORAL_TABLET | Freq: Every day | ORAL | Status: DC
  Filled 2015-04-29 (×2): qty 1

## 2015-04-29 MED ORDER — ADULT MULTIVITAMIN W/MINERALS CH
1.0000 | ORAL_TABLET | Freq: Every day | ORAL | Status: DC
  Filled 2015-04-29 (×2): qty 1

## 2015-04-29 MED ORDER — INSULIN ASPART 100 UNIT/ML ~~LOC~~ SOLN
0.0000 [IU] | Freq: Three times a day (TID) | SUBCUTANEOUS | Status: DC
  Administered 2015-04-29 – 2015-04-30 (×3): 2 [IU] via SUBCUTANEOUS

## 2015-04-29 MED ORDER — VITAMIN B-1 100 MG PO TABS
100.0000 mg | ORAL_TABLET | Freq: Every day | ORAL | Status: DC
  Filled 2015-04-29 (×2): qty 1

## 2015-04-29 MED ORDER — NICOTINE 21 MG/24HR TD PT24
21.0000 mg | MEDICATED_PATCH | Freq: Every day | TRANSDERMAL | Status: DC
  Administered 2015-04-29 – 2015-04-30 (×2): 21 mg via TRANSDERMAL
  Filled 2015-04-29 (×2): qty 1

## 2015-04-29 MED ORDER — POTASSIUM CHLORIDE 20 MEQ/15ML (10%) PO SOLN
40.0000 meq | Freq: Once | ORAL | Status: AC
  Administered 2015-04-29: 40 meq via ORAL
  Filled 2015-04-29: qty 30

## 2015-04-29 MED ORDER — ALBUMIN HUMAN 25 % IV SOLN
50.0000 g | Freq: Once | INTRAVENOUS | Status: AC
  Administered 2015-04-29: 50 g via INTRAVENOUS
  Filled 2015-04-29: qty 200

## 2015-04-29 MED ORDER — INSULIN ASPART 100 UNIT/ML ~~LOC~~ SOLN: 0.0000 [IU] | Freq: Every day | SUBCUTANEOUS | Status: DC

## 2015-04-29 NOTE — Progress Notes (Signed)
Patient ID: Katrina Cohen, female   DOB: [birth year], 49 y.o.   MRN: [id number]   US from [facility] reviewed-she has enlarged liver with diffuse hyperechogenicity consistent with either fatty infiltration or cirrhosis, and an enlarged spleen up to 972 ml. GB negative, no ductal dilation  She does not definitely have cirrhosis based on US, and the enlarged spleen may be secondary to acute mono. She should have repeat imaging in a couple months once acute mono resolves per Dr. Norma Fredricksonoledo. She needs a follow up appt with Dr Norma Fredricksonoledo within a week or so after discharge, but does not need to remain hospitalized from a GI standpoint. Will need serial labs, etc. Do not think she needs narcotics for current abdominal discomfort-would D/C OK to restart home meds. GI available.

## 2015-04-29 NOTE — Consult Note (Addendum)
Consultation  Referring Provider: [facility]  Primary Care Physician:   Dr Enid Derry - former Primary Gastroenterologist:  Unassigned: Dr. Norma Fredrickson in [facility]  Reason for Consultation:   Elevated LFT's   HPI: Katrina Cohen is a 49 y.o. female  admitted last evening through the emergency room. She had been seen by her PCP and had abdominal ultrasound apparently yesterday at [facility] which showed hepatosplenomegaly and possible cirrhosis and was therefore advised to be admitted. Patient has history of significant depression and bipolar disorder, chronic anxiety, don't onset diabetes mellitus, and hypertension. She is status post C-section and appendectomy. Patient relates having seen [facility] within the past year for dysphagia. She says she was seen by Dr. Dulce Sellar and had an EGD and barium swallow. Barium swallow did not show any evidence of stricture or motility disorder but she could not swallow the pill. She says she was discharged from the [facility] practice and had more recently seen Dr. Norma Fredrickson but has not had any procedures there. She is also set up to see ENT later this month. These appointments 4 for issues with dysphagia. Patient says she has no prior history of cirrhosis or chronic liver disease that she knows of but was told at one point that her liver enzymes were elevated. She has history of chronic EtOH use intermittently over many years. She is vague about whether she has been actively drinking recently. Patient became acutely ill between 2 and 3 weeks ago with fever chills lymphadenopathy in the neck and sore throat. She has been diagnosed with mononucleosis with a positive EBV IgM. She's been feeling quite poorly over the past couple of weeks with absolutely no appetite though says she's not having any active nausea or vomiting currently. She denies any real abdominal pain but says her abdomen is tender if someone touches it. She has had some mild diarrhea. No fever over  the past week her sore throat has improved. Her urine has been darker over the past several days. Labs are reviewed, acute hepatitis serologies are pending EtOH level was negative on admission Tylenol level negative total bili is 5.9 alkaline phosphatase 165 AST of 91 ALT 22, sodium 131, potassium 3.3, platelets 126, pro time 18.7 with INR of 1.56. Drug screen pending   Past Medical History  Diagnosis Date  . Bipolar disorder (HCC)   . Anxiety   . Depression   . Diabetes mellitus   . Hypertension     Past Surgical History  Procedure Laterality Date  . Cesarean section    . Appendectomy    . Colonoscopy      Prior to Admission medications   Medication Sig Start Date End Date Taking? Authorizing Provider  acetaminophen (TYLENOL) 160 MG/5ML liquid Take 500 mg by mouth every 4 (four) hours as needed for fever or pain.   Yes Historical Provider, MD  ARIPiprazole (ABILIFY) 5 MG tablet Take 5 mg by mouth daily. 04/02/15  Yes Historical Provider, MD  cholecalciferol (VITAMIN D) 1000 UNITS tablet Take 1,000 Units by mouth daily.   Yes Historical Provider, MD  citalopram (CELEXA) 10 MG/5ML suspension Take 20 mLs by mouth every evening. 04/12/15  Yes Historical Provider, MD  dicyclomine (BENTYL) 10 MG/5ML syrup Take 10 mLs by mouth 4 (four) times daily. 03/12/15  Yes Historical Provider, MD  diphenhydrAMINE (BENADRYL) 12.5 MG/5ML elixir Take 25 mg by mouth 4 (four) times daily as needed for itching or allergies.   Yes Historical Provider, MD  LamoTRIgine 200  MG TBDP Take 400 mg by mouth at bedtime. 12/19/14  Yes Historical Provider, MD  lansoprazole (PREVACID SOLUTAB) 30 MG disintegrating tablet Take 30 mg by mouth 2 (two) times daily. 04/24/15 04/23/16 Yes Historical Provider, MD  lisinopril (PRINIVIL,ZESTRIL) 20 MG tablet Take 20 mg by mouth daily. 12/26/14  Yes Historical Provider, MD  LORazepam (ATIVAN) 1 MG tablet Take 1 mg by mouth 3 (three) times daily as needed for anxiety.    Yes Historical  Provider, MD  metFORMIN (GLUCOPHAGE) 1000 MG tablet Take 1,000 mg by mouth 2 (two) times daily. 12/24/14  Yes Historical Provider, MD  pravastatin (PRAVACHOL) 40 MG tablet Take 40 mg by mouth daily. 01/06/15  Yes Historical Provider, MD  sucralfate (CARAFATE) 1 GM/10ML suspension Take 10 mLs by mouth 4 (four) times daily. 04/09/15 05/09/15 Yes Historical Provider, MD  vitamin B-12 (CYANOCOBALAMIN) 1000 MCG tablet Take 1,000 mcg by mouth daily.   Yes Historical Provider, MD    Current Facility-Administered Medications  Medication Dose Route Frequency Provider Last Rate Last Dose  . antiseptic oral rinse (CPC / CETYLPYRIDINIUM CHLORIDE 0.05%) solution 7 mL  7 mL Mouth Rinse q12n4p Eston Esters, MD      . chlorhexidine (PERIDEX) 0.12 % solution 15 mL  15 mL Mouth Rinse BID Eston Esters, MD      . dextrose 5 %-0.45 % sodium chloride infusion   Intravenous Continuous Eston Esters, MD 125 mL/hr at 04/28/15 2321    . enoxaparin (LOVENOX) injection 40 mg  40 mg Subcutaneous Q24H Eston Esters, MD   40 mg at 04/28/15 2321  . morphine 2 MG/ML injection 1 mg  1 mg Intravenous Q3H PRN Eston Esters, MD      . ondansetron [facility]) injection 4 mg  4 mg Intravenous Q6H PRN Eston Esters, MD   4 mg at 04/29/15 1008  . pantoprazole (PROTONIX) injection 40 mg  40 mg Intravenous Q24H Eston Esters, MD   40 mg at 04/28/15 2321  . piperacillin-tazobactam (ZOSYN) IVPB 3.375 g  3.375 g Intravenous [address], RPH   3.375 g at 04/29/15 4098    Allergies as of 04/28/2015  . (No Known Allergies)    Family History  Problem Relation Age of Onset  . Diabetes Mother   . Cancer Father   . Diabetes Sister   . Diabetes Brother     Social History   Social History  . Marital Status: Legally Separated    Spouse Name: N/A  . Number of Children: N/A  . Years of Education: N/A   Occupational History  . Not on file.   Social History Main Topics  . Smoking status: Current Every Day Smoker -- 1.00 packs/day    Types:  Cigarettes  . Smokeless tobacco: Not on file  . Alcohol Use: Yes     Comment: occasionally  . Drug Use: No  . Sexual Activity: Not Currently   Other Topics Concern  . Not on file   Social History Narrative    Review of Systems: Pertinent positive and negative review of systems were noted in the above HPI section.  All other review of systems was otherwise negative.  Physical Exam: Vital signs in last 24 hours: Temp:  [97.6 F (36.4 C)-99.6 F (37.6 C)] 98.5 F (36.9 C) (12/06 0611) Pulse Rate:  [98-117] 98 (12/06 0611) Resp:  [20] 20 (12/06 0611) BP: (91-135)/(53-76) 91/53 mmHg (12/06 0611) SpO2:  [96 %-98 %] 96 % (12/06 0611) Last BM Date: 04/28/15 General:  Alert, well-developed, well-nourished, WF, pleasant and cooperative in NAD Head:  Normocephalic and atraumatic. Eyes:  Sclera icteric.  Conjunctiva pink. Ears:  Normal auditory acuity. Nose:  No deformity, discharge, or lesions. Mouth:  No deformity or lesions.   Neck:  Supple; no masses or thyromegaly. Lungs:  Clear throughout to auscultation.  No wheezes, crackles, or rhonchi.  Heart:  Regular rate and rhythm; soft syts murmurs,  Abdomen:  Soft, BS+, tender over liver which is enlarged palp 4 FB below costal margin, cannot palp spleen tip, no fluid wave Rectal:  Deferred  Msk:  Symmetrical without gross deformities. . Pulses:  Normal pulses noted. Extremities:  Without clubbing or edema. Neurologic:  Alert and  oriented x4;  grossly normal neurologically. Skin:  Intact without significant lesions or rashes.. Psych:  Alert and cooperative. Normal mood and affect.  Intake/Output from previous day:   Intake/Output this shift:    Lab Results:  Recent Labs  04/29/15 0119  WBC 7.6  HGB 9.7*  HCT 29.9*  PLT 126*   BMET  Recent Labs  04/28/15 2055 04/29/15 0119  NA 131* 130*  K 3.3* 3.2*  CL 100* 101  CO2 22 22  GLUCOSE 140* 199*  BUN <5* <5*  CREATININE 0.55 0.46  CALCIUM 8.3* 7.9*    LFT  Recent Labs  04/29/15 0119  PROT 6.2*  ALBUMIN 2.2*  AST 79*  ALT 19  ALKPHOS 158*  BILITOT 5.5*  BILIDIR 2.9*   PT/INR  Recent Labs  04/29/15 0119  LABPROT 18.7*  INR 1.56*   Hepatitis Panel pending   IMPRESSION:  #1 49 yo female admitted with mildly elevated LFT's and hepatosplenomegaly noted on US, possible cirrhosis. This is in the setting of acute illness with mononucleosis. She may have a mono hepatitis, and of course splenomegaly is associated with mono Also consider acute mono hepatitis superimposed on chronic alcoholic liver disease/ETOH hepatitis #2 thrombocytopenia #3 coagulopathy #4 hypokalemia #5 chronic dysphagia- recent workup negative #6 AODM #7 Bipolar /depression  Plan:  Obtain outside US report  [facility] Await pending serologies EBV titer D/C Zosyn OK to restart Psych meds  Return to care of Dr Ursula Alertoledo-GI on discharge   [facility]  04/29/2015, 10:11 AM      Attending physician's note   I have taken a history, examined the patient and reviewed the chart. I agree with the Advanced Practitioner's note, impression and recommendations. Acute mononucleosis with mildly elevated LFTs and abd US apparently showing HSM and possible underlying cirrhosis. Possible mono hepatitis or mono hepatitis with underlying etoh or other type of cirrhosis. Awaiting pending serologies and outside abd US to review. Supportive mgmt. Avoid all etoh. No more than 2g of acetominophen daily. Further hepatic evaluation and any further dysphagia evaluation needed can be completed as an outpatient with her gastroenterologist, Dr. Norma Fredricksonoledo.   Claudette HeadMalcolm Riley Hallum, MD Clementeen GrahamFACG [phone] Mon-Fri 8a-5p [phone] after 5p, weekends, holidays

## 2015-04-29 NOTE — Progress Notes (Signed)
Inpatient Diabetes Program Recommendations  AACE/ADA: New Consensus Statement on Inpatient Glycemic Control (2015)  Target Ranges:  Prepandial:   less than 140 mg/dL      Peak postprandial:   less than 180 mg/dL (1-2 hours)      Critically ill patients:  140 - 180 mg/dL    Results for Katrina Cohen, Katrina Cohen (MRN [id number]) as of 04/29/2015 09:17  Ref. Range 04/28/2015 20:55 04/29/2015 01:19  Glucose Latest Ref Range: 65-99 mg/dL 782140 (H) 956199 (H)    Admit: Hepatosplenomegaly/ Mononucleosis  History: DM, HTN  Home DM Meds: Metformin 1000 mg bid  Current Insulin Orders: None    MD- Please place orders for Novolog Sensitive SSI Q4 hours       --Will follow patient during hospitalization--  Ambrose FinlandJeannine Johnston Graylen Noboa RN, MSN, CDE Diabetes Coordinator Inpatient Glycemic Control Team Team Pager: [phone] (8a-5p)

## 2015-04-29 NOTE — Progress Notes (Addendum)
[facility] PROGRESS NOTE  Katrina Cohen ZOX:[id number] DOB: [birth year] DOA: 04/28/2015 [facility] [place] , [place] . Last seen by Deneen HartsElizabeth Todd, NP  Brief narrative 49 year old female with history of hypertension, diabetes mellitus, bipolar disorder, alcohol abuse was recently diagnosed with infectious mononucleosis 2 weeks back after having symptoms of sore throat, cervical lymphadenopathy, weakness with fever (EBV IgM +), discharged on supportive care. 3 days prior to hospitalization she noticed jaundice, fevers with chills and worsening right upper quadrant abdominal pain associated with vomiting and difficulty swallowing with loss of appetite. Also reports being very weak. She had an abdominal ultrasound done in the PCP office on 12/5 which showed hepatosplenomegaly suggestive of possible cirrhosis with portal hypertension. Labs showed elevated total bilirubin and transaminitis (AST >ALT), mild cytopenia and elevated INR. Her PCP consulted her gastroenterologist Dr. Norma Fredricksonoledo who recommended sending her to ED for hospitalization.    Assessment/Plan: Acute hepatitis Possibly alcoholic versus combination of alcohol use and recent infectious mononucleosis. Have contacted PCP office to fax  me liver ultrasound results, recent labs and EGD done previously by Dr. Dulce Sellarutlaw or dysphagia. Labs sent for hepatitis panel, EBV, CMV, HIV antibody, ANA, continued mitochondrial antibody, anti-smooth muscle antibody and AFB. Check UA and drug screen.  Alcohol level was insignificant on admission. Discontinued IV antibiotics. Follow blood cultures. Pain control with when necessary morphine. Seen by [facility] and recommend to obtain outside records and will follow. -Monitor platelets, LFTs and INR daily.  Chronic dysphagia Barium swallow done in the past which was unremarkable.   Essential hypertension Holding blood pressure medications.  Type 2  diabetes mellitus Holding by mouth medications. Monitor on sliding scale coverage.   hypokalemia Replenish  Hyponatremia Received IV fluids on admission.   B/l Leg edema Possibly with low albumin. Will order some  IV albumin.   Alcohol abuse Reports drinking 6 beers per day (was drinking up to 8-9 beers until one month back). Denies withdrawal symptoms. Reports she is trying to quit. Monitor on CIWA. Add thiamine, folate and multivitamin.  Tobacco abuse Smokes one pack per day. Ordered nicotine patch.   Code Status: full code Family Communication: none at bedside Disposition Plan: currently inpt    Consultants:  [facility]  Procedures:  none  Antibiotics:  IV zosyn 12/5-12/6  HPI/Subjective: Seen and examined. Still has pain over right upper quadrant. Denies nausea or vomiting.  Objective: Filed Vitals:   04/28/15 2213 04/29/15 0611  BP: 106/53 91/53  Pulse: 117 98  Temp: 99.6 F (37.6 C) 98.5 F (36.9 C)  Resp: 20 20   No intake or output data in the 24 hours ending 04/29/15 1315 There were no vitals filed for this visit.  Exam:   General:  Middle aged female not in distress  HEENT: No pallor, icterus+, moist mucosa  Chest: Clear to auscultation bilaterally  Cardiovascular: S1 and S2, no murmurs rub  GI: Soft, nondistended, right upper quadrant tenderness, bowel sounds present  Musculoskeletal: Warm, 1+ pitting edema  CNS: Alert and oriented, no tremors   Data Reviewed: Basic Metabolic Panel:  Recent Labs Lab 04/28/15 2055 04/29/15 0119  NA 131* 130*  K 3.3* 3.2*  CL 100* 101  CO2 22 22  GLUCOSE 140* 199*  BUN <5* <5*  CREATININE 0.55 0.46  CALCIUM 8.3* 7.9*   Liver Function Tests:  Recent Labs Lab 04/28/15 2055 04/29/15 0119  AST 91* 79*  ALT 22 19  ALKPHOS 165* 158*  BILITOT  5.9* 5.5*  PROT 6.7 6.2*  ALBUMIN 2.5* 2.2*   No results for input(s): LIPASE, AMYLASE in the last 168 hours. No results for input(s): AMMONIA  in the last 168 hours. CBC:  Recent Labs Lab 04/29/15 0119  WBC 7.6  NEUTROABS 5.5  HGB 9.7*  HCT 29.9*  MCV 93.7  PLT 126*   Cardiac Enzymes: No results for input(s): CKTOTAL, CKMB, CKMBINDEX, TROPONINI in the last 168 hours. BNP (last 3 results) No results for input(s): BNP in the last 8760 hours.  ProBNP (last 3 results) No results for input(s): PROBNP in the last 8760 hours.  CBG: No results for input(s): GLUCAP in the last 168 hours.  Recent Results (from the past 240 hour(s))  Culture, blood (routine x 2)     Status: None (Preliminary result)   Collection Time: 04/28/15 12:05 AM  Result Value Ref Range Status   Specimen Description BLOOD LEFT ANTECUBITAL  Final   Special Requests   Final    IN PEDIATRIC BOTTLE 2CC Performed at [facility]    Culture PENDING  Incomplete   Report Status PENDING  Incomplete  Culture, blood (routine x 2)     Status: None (Preliminary result)   Collection Time: 04/29/15 12:14 AM  Result Value Ref Range Status   Specimen Description   Final    BLOOD RIGHT HAND Performed at [facility]    Special Requests BOTTLES DRAWN AEROBIC ONLY 10 CC  Final   Culture PENDING  Incomplete   Report Status PENDING  Incomplete     Studies: No results found.  Scheduled Meds: . antiseptic oral rinse  7 mL Mouth Rinse q12n4p  . chlorhexidine  15 mL Mouth Rinse BID  . enoxaparin (LOVENOX) injection  40 mg Subcutaneous Q24H  . nicotine  21 mg Transdermal Daily  . pantoprazole (PROTONIX) IV  40 mg Intravenous Q24H  . piperacillin-tazobactam (ZOSYN)  IV  3.375 g Intravenous Q8H   Continuous Infusions: . dextrose 5 % and 0.45% NaCl 125 mL/hr at 04/28/15 2321      Time spent: 25 minutes    Eddie North  [facility] Pager [phone] If 7PM-7AM, please contact night-coverage at [email or web address], password [facility] 04/29/2015, 1:15 PM  LOS: 1 day

## 2015-04-29 NOTE — Care Management Note (Signed)
Case Management Note  Patient Details  Name: Katrina Cohen MRN: [id number] Date of Birth: Jun 17, 1965  Subjective/Objective:         Enlarged liver and spleen with hypotension           Action/Plan:Date: April 29, 2015 Chart reviewed for concurrent status and case management needs. Will continue to follow patient for changes and needs: Marcelle Smilinghonda Davis, RN, BSN, [place]   [phone]   Expected Discharge Date:   Johnathan Hausen([place])               Expected Discharge Plan:  Home/Self Care  In-House Referral:  NA  Discharge planning Services  CM Consult  Post Acute Care Choice:  NA Choice offered to:  NA  DME Arranged:    DME Agency:     HH Arranged:    HH Agency:     Status of Service:  Completed, signed off  Medicare Important Message Given:    Date Medicare IM Given:    Medicare IM give by:    Date Additional Medicare IM Given:    Additional Medicare Important Message give by:     If discussed at Long Length of Stay Meetings, dates discussed:    Additional Comments:  Golda AcreDavis, Rhonda Lynn, RN 04/29/2015, 10:16 AM

## 2015-04-29 NOTE — Progress Notes (Signed)
Initial Nutrition Assessment  DOCUMENTATION CODES:   Non-severe (moderate) malnutrition in context of chronic illness, Obesity unspecified  INTERVENTION:  - RD will continue to monitor for needs   NUTRITION DIAGNOSIS:   Swallowing difficulty related to chronic illness as evidenced by per patient/family report.  GOAL:   Patient will meet greater than or equal to 90% of their needs  MONITOR:   PO intake, Weight trends, Labs, I & O's  REASON FOR ASSESSMENT:   Malnutrition Screening Tool  ASSESSMENT:   49 yo female with history of HTN, DM, Bipolar disorder who was diagnosed with infectious mononucleosis 2 weeks ago ( EBV IgM + ) after presenting with symptoms of sore throat, cervical lymphadenopathy, weakness, fever.  Pt seen for MST. BMI and nutrition needs estimated based on weight from 11/17/14 with no new weight available since that date. BMI indicates obesity. Diet advanced from NPO to Soft at 1032 today and pt consumed 100% of soup. She reports that she feels full after intake but denies nausea or overt abdominal pain. PTA her appetite was decreased for at least several months but she is unable to be more specific about time frame. She states that for the past 1 year she has had difficulty swallowing and needs to chew foods very thoroughly and incorporate a "hard/forceful swallow" in order to get items down; she was using chewable, crushable pills or liquid medications and mixing with applesauce.  She states that she has been having intermittent nausea and vomiting that is not associated with PO intakes and that will happen at random. She did have a poor appetite PTA and would mainly consume items such as soup and sandwiches. The only time she would typically feel hungry was very early in the AM but that she was unable to eat during this time due to fear of vomiting with intakes; by the time she felt comfortable eating the feeling of hunger had gone away.  Pt reports that over the  past few months she has lost 14 lbs from UBW of 180 lbs but that she is unsure of exact time frame. Fourteen lb weight loss from UBW indicates 8% body weight in unknown time frame. No muscle or fat wasting noted at this time.   Pt not meeting needs PTA. Medications reviewed. Labs reviewed; Na: 130 mmol/L, K: 3.2 mmol/L, BUN <5, Ca: 7.9 mg/dL, Alk Phos and AST elevated.   Diet Order:  DIET SOFT Room service appropriate?: Yes; Fluid consistency:: Thin  Skin:  Reviewed, no issues  Last BM:  12/5  Height:   Ht Readings from Last 1 Encounters:  11/17/14 _0  (1.626 m)    Weight:   Wt Readings from Last 1 Encounters:  11/17/14 180 lb (81.647 kg)    Ideal Body Weight:  54.54 kg (kg)  BMI:  30.93 kg/m2  Estimated Nutritional Needs:   Kcal:  1650-1850  Protein:  65-75 grams  Fluid:  1.6-1.8 L/day  EDUCATION NEEDS:   No education needs identified at this time     Jarome Matin, RD, LDN Inpatient Clinical Dietitian Pager # [phone] After hours/weekend pager # [phone]

## 2015-04-30 ENCOUNTER — Inpatient Hospital Stay (HOSPITAL_COMMUNITY): Payer: Medicaid Other

## 2015-04-30 DIAGNOSIS — R0600 Dyspnea, unspecified: Secondary | ICD-10-CM

## 2015-04-30 DIAGNOSIS — K7200 Acute and subacute hepatic failure without coma: Secondary | ICD-10-CM

## 2015-04-30 DIAGNOSIS — B179 Acute viral hepatitis, unspecified: Secondary | ICD-10-CM | POA: Diagnosis present

## 2015-04-30 DIAGNOSIS — Z720 Tobacco use: Secondary | ICD-10-CM

## 2015-04-30 DIAGNOSIS — F1010 Alcohol abuse, uncomplicated: Secondary | ICD-10-CM | POA: Diagnosis present

## 2015-04-30 LAB — VITAMIN B12: VITAMIN B 12: 1827 pg/mL — AB (ref 180–914)

## 2015-04-30 LAB — GLUCOSE, CAPILLARY
GLUCOSE-CAPILLARY: 129 mg/dL — AB (ref 65–99)
Glucose-Capillary: 102 mg/dL — ABNORMAL HIGH (ref 65–99)
Glucose-Capillary: 174 mg/dL — ABNORMAL HIGH (ref 65–99)

## 2015-04-30 LAB — FANA STAINING PATTERNS

## 2015-04-30 LAB — COMPREHENSIVE METABOLIC PANEL
ALBUMIN: 2.7 g/dL — AB (ref 3.5–5.0)
ALT: 17 U/L (ref 14–54)
AST: 84 U/L — AB (ref 15–41)
Alkaline Phosphatase: 103 U/L (ref 38–126)
Anion gap: 6 (ref 5–15)
CHLORIDE: 104 mmol/L (ref 101–111)
CO2: 25 mmol/L (ref 22–32)
CREATININE: 0.53 mg/dL (ref 0.44–1.00)
Calcium: 8.6 mg/dL — ABNORMAL LOW (ref 8.9–10.3)
GFR calc Af Amer: >60 mL/min (ref >60)
GFR calc non Af Amer: >60 mL/min (ref >60)
GLUCOSE: 130 mg/dL — AB (ref 65–99)
POTASSIUM: 3.3 mmol/L — AB (ref 3.5–5.1)
Sodium: 135 mmol/L (ref 135–145)
Total Bilirubin: 6.3 mg/dL — ABNORMAL HIGH (ref 0.3–1.2)
Total Protein: 6.4 g/dL — ABNORMAL LOW (ref 6.5–8.1)

## 2015-04-30 LAB — HEPATITIS PANEL, ACUTE
HEP A IGM: NEGATIVE
HEP B C IGM: NEGATIVE
HEP B S AG: NEGATIVE

## 2015-04-30 LAB — IRON AND TIBC
Iron: 40 ug/dL (ref 28–170)
Saturation Ratios: 22 % (ref 10.4–31.8)
TIBC: 178 ug/dL — ABNORMAL LOW (ref 250–450)
UIBC: 138 ug/dL

## 2015-04-30 LAB — MITOCHONDRIAL ANTIBODIES: Mitochondrial M2 Ab, IgG: 22.4 Units — ABNORMAL HIGH (ref 0.0–20.0)

## 2015-04-30 LAB — ANTI-SMOOTH MUSCLE ANTIBODY, IGG: F-Actin IgG: 11 Units (ref 0–19)

## 2015-04-30 LAB — AFP TUMOR MARKER: AFP TUMOR MARKER: 4.1 ng/mL (ref 0.0–8.3)

## 2015-04-30 LAB — ANTINUCLEAR ANTIBODIES, IFA: ANTINUCLEAR ANTIBODIES, IFA: POSITIVE — AB

## 2015-04-30 MED ORDER — POTASSIUM CHLORIDE CRYS ER 20 MEQ PO TBCR
40.0000 meq | EXTENDED_RELEASE_TABLET | Freq: Once | ORAL | Status: DC
  Filled 2015-04-30: qty 2

## 2015-04-30 MED ORDER — POTASSIUM CHLORIDE 10 MEQ/100ML IV SOLN
10.0000 meq | INTRAVENOUS | Status: AC
  Administered 2015-04-30 (×3): 10 meq via INTRAVENOUS
  Filled 2015-04-30 (×3): qty 100

## 2015-04-30 MED ORDER — NICOTINE 21 MG/24HR TD PT24: 21.0000 mg | MEDICATED_PATCH | Freq: Every day | TRANSDERMAL | Status: DC

## 2015-04-30 MED ORDER — MORPHINE SULFATE (PF) 2 MG/ML IV SOLN: 1.0000 mg | INTRAVENOUS | Status: DC | PRN

## 2015-04-30 MED ORDER — DIPHENHYDRAMINE HCL 12.5 MG/5ML PO ELIX: 25.0000 mg | ORAL_SOLUTION | Freq: Three times a day (TID) | ORAL | Status: DC | PRN

## 2015-04-30 NOTE — Discharge Instructions (Signed)
Alcoholic Liver Disease °Alcoholic liver disease happens when the liver does not work the way it should. The condition is caused by drinking too much alcohol for many years.  °HOME CARE °· Do not drink alcohol. °· Take medicines only as told by your doctor. °· Take vitamins only as told by your doctor. °· Follow any diet instructions that your doctor gave you. You may need to: °¨ Eat foods that have thiamine. These include whole-wheat cereals, pork, and raw vegetables. °¨ Eat foods that have folic acid. These include vegetables, fruits, meats, beans, nuts, and dairy foods. °¨ Eat foods that are high in carbohydrates. These include yogurt, beans, potatoes, and rice. °GET HELP IF: °· You have a fever. °· You are short of breath. °· You have trouble breathing. °· You have bright red blood in your poop (stool). °· Your poop looks like tar. °· You throw up (vomit) blood. °· Your skin looks more yellow, pale, or dark. °· You get headaches. °· You have trouble thinking. °· You have trouble balancing or walking. °  °This information is not intended to replace advice given to you by your health care provider. Make sure you discuss any questions you have with your health care provider. °  °Document Released: 03/07/2009 Document Revised: 09/24/2014 Document Reviewed: 04/11/2014 °Elsevier Interactive Patient Education ©2016 [employer] ° °

## 2015-04-30 NOTE — Progress Notes (Signed)
Patient unable to tolerate PO intake of potassium. Patient attempted to take PO potassium but not able to keep down. MD notified. Orders for IV potassium supplementation placed.

## 2015-04-30 NOTE — Progress Notes (Signed)
Echocardiogram 2D Echocardiogram has been performed.  Katrina Cohen, Katrina Cohen 04/30/2015, 12:56 PM

## 2015-04-30 NOTE — Discharge Summary (Addendum)
Physician Discharge Summary  Gladyse Corvin WUJ:[id number] DOB: Oct 05, 1965 DOA: 04/28/2015  PCP: Deneen Harts, NP at [facility] physicians at [facility] [place]  Admit date: 04/28/2015 Discharge date: 04/30/2015  Time spent: 30  minutes  Recommendations for Outpatient Follow-up:  Discharge home with PCP follow up with PCP and Dr Norma Fredrickson in 1-2 weeks. Will need repeat US abdomen in 6-8 weeks once her acute mononucleosis has resolved.Marland Kitchen  Discharge Diagnoses:  Principal Problem:   Acute hepatitis   Active Problems:   Major depression (HCC)   Mononucleosis   Malnutrition of moderate degree   Type 2 diabetes mellitus (HCC)   Hypokalemia   Anemia   Hyponatremia   Coagulopathy (HCC)   Hypoalbuminemia   Alcohol abuse   Tobacco abuse   Discharge Condition: fair  Diet recommendation: diabetic  There were no vitals filed for this visit.  History of present illness:  49 year old female with history of hypertension, diabetes mellitus, bipolar disorder, alcohol abuse was recently diagnosed with infectious mononucleosis 2 weeks back after having symptoms of sore throat, cervical lymphadenopathy, weakness with fever (EBV IgM +), discharged on supportive care. 3 days prior to hospitalization she noticed jaundice, fevers with chills and worsening right upper quadrant abdominal pain associated with vomiting and difficulty swallowing with loss of appetite. Also reports being very weak. She had an abdominal ultrasound done in the PCP office on 12/5 which showed hepatosplenomegaly suggestive of possible cirrhosis with portal hypertension. Labs showed elevated total bilirubin and transaminitis (AST >ALT), mild cytopenia and elevated INR. Her PCP consulted her gastroenterologist Dr. Norma Fredrickson who recommended sending her to ED for hospitalization.  Hospital Course:  Acute hepatitis Possibly due to recent infectious mononucleosis worsened by ongoing etoh use. Outside US abdomen results obtained which  doesnot show finding of cirrhosis or ascites. . Have contacted PCP office to fax me liver ultrasound results, recent labs and EGD done on 11/2014  by Dr. Dulce Sellar for dysphagia shows gastritis only. . - hepatitis panel and HIV negative. Labs sent for , EBV, CMV,  ANA, antimitochondrial antibody, anti-smooth muscle antibody .  Urine drug screen negative..  Alcohol level was insignificant on admission. -blood cx negative. UA suggestive of UTI but pt asymptomatic.  -iron panel and B12 normal. transaminiases improved in am labs though total bili is higher.  Seen by [facility] and after reviewing outside US abdomen suggests this might be associated with recent mononucleosis and recommend her to follow with GI Dr Norma Fredrickson. She will need a repeat US abd in 6-8 weeks once symptoms fully subside.  pt stable for discharge home. She should have her LFTs checked and follow up on labs sent this admission. -She has been counseled on etoh cessation.  -pt stable for discharge home with outpt follow up.  Chronic dysphagia Barium swallow done in the past which was unremarkable. EGD from 7/16 shows gastritis only. Has difficulty swallowing pills. Oral meal  intake adequate. Follow up as outpt  Essential hypertension Hypotensive on presentation.   Type 2 diabetes mellitus Stable. Resume metformin   hypokalemia Replenished  Hyponatremia Dehydration vs potomania. Improved with IV flids.  B/l Leg edema 2 d echo shows normal EF with grade 2 diastolic dysfn ? Acute viral illness vs hypoalbuminemia. Received a dose of IV albumin. Should be monitored as outpt.   Alcohol abuse Reports drinking 6 beers per day (was drinking up to 8-9 beers until one month back). Denies withdrawal symptoms. Reports she is trying to quit. Monitored on CIWA. No signs of withdrawal.  counseled on cessation. Patient plans to quit on her own.  Tobacco abuse Smokes one pack per day. Ordered nicotine patch.   Code Status: full  code Family Communication: none at bedside Disposition Plan: home    Consultants:  [facility]  Procedures:  none  Antibiotics:  IV zosyn 12/5-12/6  HPI/Subjective:  Discharge Exam: Filed Vitals:   04/30/15 0441 04/30/15 1455  BP: 112/70 116/68  Pulse: 88 94  Temp: 97.8 F (36.6 C) 97.8 F (36.6 C)  Resp: 28 20     General: Middle aged female not in distress  HEENT: No pallor, icterus+, moist mucosa  Chest: Clear to auscultation bilaterally  Cardiovascular: S1 and S2, no murmurs rub  GI: Soft, nondistended, non tender ( right upper quadrant tenderness resolved today) , bowel sounds present  Musculoskeletal: Warm, trace b/l pitting edema  CNS: Alert and oriented, no tremors  Discharge Instructions    Current Discharge Medication List    START taking these medications   Details  nicotine (NICODERM CQ - DOSED IN MG/24 HOURS) 21 mg/24hr patch Place 1 patch (21 mg total) onto the skin daily. Qty: 28 patch, Refills: 0      CONTINUE these medications which have CHANGED   Details  diphenhydrAMINE (BENADRYL) 12.5 MG/5ML elixir Take 10 mLs (25 mg total) by mouth every 8 (eight) hours as needed for itching or allergies. Qty: 120 mL, Refills: 0      CONTINUE these medications which have NOT CHANGED   Details  ARIPiprazole (ABILIFY) 5 MG tablet Take 5 mg by mouth daily. Refills: 0    cholecalciferol (VITAMIN D) 1000 UNITS tablet Take 1,000 Units by mouth daily.    citalopram (CELEXA) 10 MG/5ML suspension Take 20 mLs by mouth every evening. Refills: 1    dicyclomine (BENTYL) 10 MG/5ML syrup Take 10 mLs by mouth 4 (four) times daily.    LamoTRIgine 200 MG TBDP Take 400 mg by mouth at bedtime.    lansoprazole (PREVACID SOLUTAB) 30 MG disintegrating tablet Take 30 mg by mouth 2 (two) times daily.    lisinopril (PRINIVIL,ZESTRIL) 20 MG tablet Take 20 mg by mouth daily.    LORazepam (ATIVAN) 1 MG tablet Take 1 mg by mouth 3 (three) times daily as needed  for anxiety.     metFORMIN (GLUCOPHAGE) 1000 MG tablet Take 1,000 mg by mouth 2 (two) times daily.    sucralfate (CARAFATE) 1 GM/10ML suspension Take 10 mLs by mouth 4 (four) times daily.    vitamin B-12 (CYANOCOBALAMIN) 1000 MCG tablet Take 1,000 mcg by mouth daily.      STOP taking these medications     acetaminophen (TYLENOL) 160 MG/5ML liquid      pravastatin (PRAVACHOL) 40 MG tablet        No Known Allergies Follow-up Information    Schedule an appointment as soon as possible [facility] .   Why:  This is your assigned Medicaid [place] access doctor If you prefer another contact DSS [phone] DSS assigned your doctor *You may receive a bill if you go to any family Dr not assigned to you   Contact information:   As of 04/28/15 Your recent [employer] [place] access response verification shows [facility] [address] [place], [place] [phone]      Follow up with Medicaid [place] Access Covered Patient .   Why:  USE THIS WEBSITE TO ASSIST WITH UNDERSTANDING YOUR  COVERAGE, RENEW APPLICATION [place] Medicaid Transportation to Dr appts if you are have full Medicaid: [phone], [phone] or [phone] Transportation Supervisor [phone]    Contact information:   As a Medicaid client you MUST contact DSS/SSI each time you change address, move to another [place] county or another state to keep your address updated [place]: [phone] [address] [place], [place] [address] [email or web address]       Follow up with Rosina Lowenstein, MD.   Specialty:  Internal Medicine   Why:  make appointment for follow up of jaundice   Contact information:   [address] [place] [address] [phone]        The results of significant diagnostics from this hospitalization (including imaging, microbiology, ancillary and laboratory) are listed below for reference.    Significant  Diagnostic Studies: No results found.  Microbiology: Recent Results (from the past 240 hour(s))  Culture, blood (routine x 2)     Status: None (Preliminary result)   Collection Time: 04/28/15 12:05 AM  Result Value Ref Range Status   Specimen Description BLOOD LEFT ANTECUBITAL  Final   Special Requests IN PEDIATRIC BOTTLE 2CC  Final   Culture   Final    NO GROWTH 1 DAY Performed at [facility]    Report Status PENDING  Incomplete  Culture, blood (routine x 2)     Status: None (Preliminary result)   Collection Time: 04/29/15 12:14 AM  Result Value Ref Range Status   Specimen Description BLOOD RIGHT HAND  Final   Special Requests BOTTLES DRAWN AEROBIC ONLY 10 CC  Final   Culture   Final    NO GROWTH 1 DAY Performed at [facility]    Report Status PENDING  Incomplete     Labs: Basic Metabolic Panel:  Recent Labs Lab 04/28/15 2055 04/29/15 0119 04/30/15 0510  NA 131* 130* 135  K 3.3* 3.2* 3.3*  CL 100* 101 104  CO2 GLUCOSE 140* 199* 130*  BUN <5* <5* <5*  CREATININE 0.55 0.46 0.53  CALCIUM 8.3* 7.9* 8.6*   Liver Function Tests:  Recent Labs Lab 04/28/15 2055 04/29/15 0119 04/30/15 0510  AST 91* 79* 84*  ALT ALKPHOS 165* 158* 103  BILITOT 5.9* 5.5* 6.3*  PROT 6.7 6.2* 6.4*  ALBUMIN 2.5* 2.2* 2.7*   No results for input(s): LIPASE, AMYLASE in the last 168 hours. No results for input(s): AMMONIA in the last 168 hours. CBC:  Recent Labs Lab 04/29/15 0119  WBC 7.6  NEUTROABS 5.5  HGB 9.7*  HCT 29.9*  MCV 93.7  PLT 126*   Cardiac Enzymes: No results for input(s): CKTOTAL, CKMB, CKMBINDEX, TROPONINI in the last 168 hours. BNP: BNP (last 3 results) No results for input(s): BNP in the last 8760 hours.  ProBNP (last 3 results) No results for input(s): PROBNP in the last 8760 hours.  CBG:  Recent Labs Lab 04/29/15 1727 04/29/15 2108 04/30/15 0727 04/30/15 1209  GLUCAP 137* 119* 102* 129*        Signed:  Irineo Gaulin  [facility] 04/30/2015, 3:12 PM

## 2015-05-01 LAB — URINE DRUGS OF ABUSE SCREEN W ALC, ROUTINE (REF LAB)
Amphetamines, Urine: NEGATIVE ng/mL
BARBITURATE, UR: NEGATIVE ng/mL
Benzodiazepine Quant, Ur: NEGATIVE ng/mL
COCAINE (METAB.): NEGATIVE ng/mL
Cannabinoid Quant, Ur: NEGATIVE ng/mL
ETHANOL U, QUAN: NEGATIVE %
METHADONE SCREEN, URINE: NEGATIVE ng/mL
OPIATE QUANT UR: NEGATIVE ng/mL
PROPOXYPHENE, URINE: NEGATIVE ng/mL
Phencyclidine, Ur: NEGATIVE ng/mL

## 2015-05-01 LAB — EPSTEIN BARR VRS(EBV DNA BY PCR)
EBV DNA QN BY PCR: POSITIVE {copies}/mL
LOG10 EBV DNA QN PCR: UNDETERMINED {Log_copies}/mL

## 2015-05-01 LAB — CMV DNA, QUANTITATIVE, PCR
CMV DNA Quant: NEGATIVE IU/mL
Log10 CMV Qn DNA Pl: UNDETERMINED log10 IU/mL

## 2015-05-04 LAB — CULTURE, BLOOD (ROUTINE X 2)
Culture: NO GROWTH
Culture: NO GROWTH

## 2015-05-24 ENCOUNTER — Emergency Department (HOSPITAL_COMMUNITY)
Admission: EM | Admit: 2015-05-24 | Discharge: 2015-05-24 | Disposition: A | Discharge status: Home / Self Care | Payer: Medicaid Other | Attending: Emergency Medicine | Admitting: Emergency Medicine

## 2015-05-24 ENCOUNTER — Emergency Department (HOSPITAL_COMMUNITY): Payer: Medicaid Other

## 2015-05-24 DIAGNOSIS — R109 Unspecified abdominal pain: Secondary | ICD-10-CM | POA: Diagnosis present

## 2015-05-24 DIAGNOSIS — F419 Anxiety disorder, unspecified: Secondary | ICD-10-CM | POA: Insufficient documentation

## 2015-05-24 DIAGNOSIS — N390 Urinary tract infection, site not specified: Secondary | ICD-10-CM | POA: Insufficient documentation

## 2015-05-24 DIAGNOSIS — I10 Essential (primary) hypertension: Secondary | ICD-10-CM | POA: Insufficient documentation

## 2015-05-24 DIAGNOSIS — Z8619 Personal history of other infectious and parasitic diseases: Secondary | ICD-10-CM | POA: Insufficient documentation

## 2015-05-24 DIAGNOSIS — E119 Type 2 diabetes mellitus without complications: Secondary | ICD-10-CM | POA: Diagnosis not present

## 2015-05-24 DIAGNOSIS — R188 Other ascites: Secondary | ICD-10-CM | POA: Diagnosis not present

## 2015-05-24 DIAGNOSIS — F319 Bipolar disorder, unspecified: Secondary | ICD-10-CM | POA: Insufficient documentation

## 2015-05-24 DIAGNOSIS — F17210 Nicotine dependence, cigarettes, uncomplicated: Secondary | ICD-10-CM | POA: Diagnosis not present

## 2015-05-24 DIAGNOSIS — R05 Cough: Secondary | ICD-10-CM | POA: Insufficient documentation

## 2015-05-24 DIAGNOSIS — Z79899 Other long term (current) drug therapy: Secondary | ICD-10-CM | POA: Insufficient documentation

## 2015-05-24 LAB — COMPREHENSIVE METABOLIC PANEL
ALK PHOS: 132 U/L — AB (ref 38–126)
ALT: 25 U/L (ref 14–54)
ANION GAP: 10 (ref 5–15)
AST: 114 U/L — ABNORMAL HIGH (ref 15–41)
Albumin: 2.6 g/dL — ABNORMAL LOW (ref 3.5–5.0)
BILIRUBIN TOTAL: 8.5 mg/dL — AB (ref 0.3–1.2)
BUN: <5 mg/dL — ABNORMAL LOW (ref 6–20)
CALCIUM: 8.6 mg/dL — AB (ref 8.9–10.3)
CO2: 21 mmol/L — AB (ref 22–32)
CREATININE: 0.43 mg/dL — AB (ref 0.44–1.00)
Chloride: 97 mmol/L — ABNORMAL LOW (ref 101–111)
GFR calc non Af Amer: >60 mL/min (ref >60)
Glucose, Bld: 123 mg/dL — ABNORMAL HIGH (ref 65–99)
Potassium: 3.2 mmol/L — ABNORMAL LOW (ref 3.5–5.1)
SODIUM: 128 mmol/L — AB (ref 135–145)
TOTAL PROTEIN: 7.4 g/dL (ref 6.5–8.1)

## 2015-05-24 LAB — URINE MICROSCOPIC-ADD ON

## 2015-05-24 LAB — CBC WITH DIFFERENTIAL/PLATELET
Basophils Absolute: 0 10*3/uL (ref 0.0–0.1)
Basophils Relative: 0 %
EOS ABS: 0.1 10*3/uL (ref 0.0–0.7)
Eosinophils Relative: 1 %
HEMATOCRIT: 33.8 % — AB (ref 36.0–46.0)
HEMOGLOBIN: 11.2 g/dL — AB (ref 12.0–15.0)
LYMPHS ABS: 1 10*3/uL (ref 0.7–4.0)
LYMPHS PCT: 15 %
MCH: 31.2 pg (ref 26.0–34.0)
MCHC: 33.1 g/dL (ref 30.0–36.0)
MCV: 94.2 fL (ref 78.0–100.0)
MONOS PCT: 8 %
Monocytes Absolute: 0.6 10*3/uL (ref 0.1–1.0)
NEUTROS ABS: 5.5 10*3/uL (ref 1.7–7.7)
NEUTROS PCT: 76 %
Platelets: 176 10*3/uL (ref 150–400)
RBC: 3.59 MIL/uL — AB (ref 3.87–5.11)
RDW: 16.4 % — ABNORMAL HIGH (ref 11.5–15.5)
WBC: 7.2 10*3/uL (ref 4.0–10.5)

## 2015-05-24 LAB — URINALYSIS, ROUTINE W REFLEX MICROSCOPIC
BILIRUBIN URINE: NEGATIVE
Glucose, UA: NEGATIVE mg/dL
Ketones, ur: NEGATIVE mg/dL
NITRITE: NEGATIVE
PH: 6.5 (ref 5.0–8.0)
PROTEIN: NEGATIVE mg/dL
SPECIFIC GRAVITY, URINE: 1 — AB (ref 1.005–1.030)

## 2015-05-24 LAB — LIPASE, BLOOD: Lipase: 59 U/L — ABNORMAL HIGH (ref 11–51)

## 2015-05-24 MED ORDER — IOHEXOL 300 MG/ML  SOLN
100.0000 mL | Freq: Once | INTRAMUSCULAR | Status: AC | PRN
  Administered 2015-05-24: 100 mL via INTRAVENOUS

## 2015-05-24 MED ORDER — CEPHALEXIN 250 MG/5ML PO SUSR: 500.0000 mg | Freq: Four times a day (QID) | ORAL | Status: DC

## 2015-05-24 MED ORDER — SODIUM CHLORIDE 0.9 % IV SOLN
1000.0000 mL | INTRAVENOUS | Status: DC
  Administered 2015-05-24: 1000 mL via INTRAVENOUS

## 2015-05-24 MED ORDER — IOHEXOL 300 MG/ML  SOLN
50.0000 mL | Freq: Once | INTRAMUSCULAR | Status: AC | PRN
  Administered 2015-05-24: 50 mL via ORAL

## 2015-05-24 MED ORDER — CEFTRIAXONE SODIUM 1 G IJ SOLR
1.0000 g | Freq: Once | INTRAMUSCULAR | Status: AC
  Administered 2015-05-24: 1 g via INTRAVENOUS
  Filled 2015-05-24: qty 10

## 2015-05-24 NOTE — ED Notes (Signed)
Pt c/o flu-like symptoms x 2 days and abdominal pain with coughing. Pt is alert and oriented x4, skin and eyes appear jaundice.

## 2015-05-24 NOTE — Discharge Instructions (Signed)
Ascites °Ascites is a collection of excess fluid in the abdomen. Ascites can range from mild to severe. It can get worse without treatment. °CAUSES °Possible causes include: °· Cirrhosis. This is the most common cause of ascites. °· Infection or inflammation in the abdomen. °· Cancer in the abdomen. °· Heart failure. °· Kidney disease. °· Inflammation of the pancreas. °· Clots in the veins of the liver. °SIGNS AND SYMPTOMS °Signs and symptoms may include: °· A feeling of fullness in your abdomen. This is common. °· An increase in the size of your abdomen or your waist. °· Swelling in your legs. °· Swelling of the scrotum in men. °· Difficulty breathing. °· Abdominal pain. °· Sudden weight gain. °If the condition is mild, you may not have symptoms. °DIAGNOSIS °To make a diagnosis, your health care provider will: °· Ask about your medical history. °· Perform a physical exam. °· Order imaging tests, such as an ultrasound or CT scan of your abdomen. °TREATMENT °Treatment depends on the cause of the ascites. It may include: °· Taking a pill to make you urinate. This is called a water pill (diuretic pill). °· Strictly reducing your salt (sodium) intake. Salt can cause extra fluid to be kept in the body, and this makes ascites worse. °· Having a procedure to remove fluid from your abdomen (paracentesis). °· Having a procedure to transfer fluid from your abdomen into a vein. °· Having a procedure that connects two of the major veins within your liver and relieves pressure on your liver (TIPS procedure). °Ascites may go away or improve with treatment of the condition that caused it.  °HOME CARE INSTRUCTIONS °· Keep track of your weight. To do this, weigh yourself at the same time every day and record your weight. °· Keep track of how much you drink and any changes in the amount you urinate. °· Follow any instructions that your health care provider gives you about how much to drink. °· Try not to eat salty (high-sodium)  foods. °· Take medicines only as directed by your health care provider. °· Keep all follow-up visits as directed by your health care provider. This is important. °· Report any changes in your health to your health care provider, especially if you develop new symptoms or your symptoms get worse. °SEEK MEDICAL CARE IF: °· Your gain more than 3 pounds in 3 days. °· Your abdominal size or your waist size increases. °· You have new swelling in your legs. °· The swelling in your legs gets worse. °SEEK IMMEDIATE MEDICAL CARE IF: °· You develop a fever. °· You develop confusion. °· You develop new or worsening difficulty breathing. °· You develop new or worsening abdominal pain. °· You develop new or worsening swelling in the scrotum (in men). °  °This information is not intended to replace advice given to you by your health care provider. Make sure you discuss any questions you have with your health care provider. °  °Document Released: 05/10/2005 Document Revised: 05/31/2014 Document Reviewed: 12/07/2013 °Elsevier Interactive Patient Education ©2016 Elsevier Inc. ° °

## 2015-05-24 NOTE — ED Provider Notes (Signed)
CSN: [id number]     Arrival date & time 05/24/15  [id number] History   First MD Initiated Contact with Patient 05/24/15 0719     Chief Complaint  Patient presents with  . Cough  . Sore Throat  . Abdominal Pain   HPI Pt was recently in the hospital in the hospital for difficulties with alcoholic cirrhosis.  She was released from the hospital but the last few days she started coming down with a cough.  She has not been eating or drinking well.  She has not vomited since she left the hospital.  She saw her primary doctor in follow up and was supposed to get a CT scan but they were unable to do that because the CT tech could not get an us.  She continues to have pain in the abdomen that goes to the back.  No diarrhea.  No fever. Past Medical History  Diagnosis Date  . Bipolar disorder (HCC) 1980s    with depression, anxiety  . Diabetes mellitus   . Hypertension   . Depression with anxiety 1980s    [facility] admission [facility].    Marland Kitchen. Dysphagia 11/2014    normal esophagram 11/2014. EGD per Dr Dulce Sellarutlaw.   . Jaundice 04/2015    In setting of infectious mononucleosis. Ultrasound in [place]: hepatosplenomegaly, portal htn, possible cirrhosis  . Mononucleosis 04/15/2015   Past Surgical History  Procedure Laterality Date  . Cesarean section    . Appendectomy    . Colonoscopy    . Esophagogastroduodenoscopy      Dr Dulce Sellarutlaw ? 2016.    Family History  Problem Relation Age of Onset  . Diabetes Mother   . Cancer Father   . Diabetes Sister   . Diabetes Brother    Social History  Substance Use Topics  . Smoking status: Current Every Day Smoker -- 1.00 packs/day    Types: Cigarettes  . Smokeless tobacco: Not on file  . Alcohol Use: Yes     Comment: occasionally   OB History    No data available     Review of Systems  Constitutional: Negative for fever.  Respiratory: Positive for cough.   Gastrointestinal: Positive for abdominal pain.  Genitourinary:       Recently diagnosed with UTI  All other  systems reviewed and are negative.     Allergies  Review of patient's allergies indicates no known allergies.  Home Medications   Prior to Admission medications   Medication Sig Start Date End Date Taking? Authorizing Provider  ARIPiprazole (ABILIFY) 5 MG tablet Take 5 mg by mouth daily. 04/02/15  Yes Historical Provider, MD  citalopram (CELEXA) 10 MG/5ML suspension Take 10 mLs by mouth every evening.  04/12/15  Yes Historical Provider, MD  LORazepam (ATIVAN) 1 MG tablet Take 1 mg by mouth 3 (three) times daily as needed for anxiety.    Yes Historical Provider, MD  dicyclomine (BENTYL) 10 MG/5ML syrup Take 10 mLs by mouth 4 (four) times daily. 03/12/15   Historical Provider, MD  diphenhydrAMINE (BENADRYL) 12.5 MG/5ML elixir Take 10 mLs (25 mg total) by mouth every 8 (eight) hours as needed for itching or allergies. 04/30/15   Nishant Dhungel, MD  LamoTRIgine 200 MG TBDP Take 400 mg by mouth at bedtime. 12/19/14   Historical Provider, MD  lansoprazole (PREVACID SOLUTAB) 30 MG disintegrating tablet Take 30 mg by mouth 2 (two) times daily. 04/24/15 04/23/16  Historical Provider, MD  lisinopril (PRINIVIL,ZESTRIL) 20 MG tablet Take 20 mg by mouth  daily. 12/26/14   Historical Provider, MD  metFORMIN (GLUCOPHAGE) 1000 MG tablet Take 1,000 mg by mouth 2 (two) times daily. 12/24/14   Historical Provider, MD  nicotine (NICODERM CQ - DOSED IN MG/24 HOURS) 21 mg/24hr patch Place 1 patch (21 mg total) onto the skin daily. 04/30/15   Nishant Dhungel, MD  sucralfate (CARAFATE) 1 GM/10ML suspension Take 10 mLs by mouth 4 (four) times daily. 04/09/15 05/09/15  Historical Provider, MD  vitamin B-12 (CYANOCOBALAMIN) 1000 MCG tablet Take 1,000 mcg by mouth daily.    Historical Provider, MD   BP 113/72 mmHg  Pulse 97  Temp(Src) 97.8 F (36.6 C) (Oral)  Resp 24  Ht  (1.626 m)  Wt 77.565 kg  BMI 29.34 kg/m2  SpO2 97% Physical Exam  Constitutional: No distress.  HENT:  Head: Normocephalic and atraumatic.   Right Ear: External ear normal.  Left Ear: External ear normal.  Eyes: Conjunctivae are normal. Right eye exhibits no discharge. Left eye exhibits no discharge. No scleral icterus.  Neck: Neck supple. No tracheal deviation present.  Cardiovascular: Normal rate, regular rhythm and intact distal pulses.   Pulmonary/Chest: Effort normal and breath sounds normal. No stridor. No respiratory distress. She has no wheezes. She has no rales.  Abdominal: Soft. Bowel sounds are normal. She exhibits no distension. There is no tenderness. There is no rebound and no guarding.  Protuberant abdomen, no fluid wave , no ttp  Musculoskeletal: She exhibits no edema or tenderness.  Neurological: She is alert. She has normal strength. No cranial nerve deficit (no facial droop, extraocular movements intact, no slurred speech) or sensory deficit. She exhibits normal muscle tone. She displays no seizure activity. Coordination normal.  Skin: Skin is warm and dry. No rash noted. She is not diaphoretic.  Psychiatric: She has a normal mood and affect.  Nursing note and vitals reviewed.   ED Course  Procedures (including critical care time) Labs Review Labs Reviewed  CBC WITH DIFFERENTIAL/PLATELET - Abnormal; Notable for the following:    RBC 3.59 (*)    Hemoglobin 11.2 (*)    HCT 33.8 (*)    RDW 16.4 (*)    All other components within normal limits  COMPREHENSIVE METABOLIC PANEL - Abnormal; Notable for the following:    Sodium 128 (*)    Potassium 3.2 (*)    Chloride 97 (*)    CO2 21 (*)    Glucose, Bld 123 (*)    BUN <5 (*)    Creatinine, Ser 0.43 (*)    Calcium 8.6 (*)    Albumin 2.6 (*)    AST 114 (*)    Alkaline Phosphatase 132 (*)    Total Bilirubin 8.5 (*)    All other components within normal limits  LIPASE, BLOOD - Abnormal; Notable for the following:    Lipase 59 (*)    All other components within normal limits  URINALYSIS, ROUTINE W REFLEX MICROSCOPIC (NOT AT [facility]) - Abnormal; Notable for  the following:    APPearance CLOUDY (*)    Specific Gravity, Urine 1.000 (*)    Hgb urine dipstick SMALL (*)    Leukocytes, UA LARGE (*)    All other components within normal limits  URINE MICROSCOPIC-ADD ON - Abnormal; Notable for the following:    Squamous Epithelial / LPF 6-30 (*)    Bacteria, UA MANY (*)    All other components within normal limits  URINE CULTURE    Imaging Review Dg Chest 2 View  05/24/2015  CLINICAL DATA:  Cough.  Abdominal pain. EXAM: CHEST  2 VIEW COMPARISON:  None. FINDINGS: The heart size and mediastinal contours are within normal limits. Both lungs are clear. The visualized skeletal structures are unremarkable. IMPRESSION: No active cardiopulmonary disease. Electronically Signed   By: Francene Boyers M.D.   On: 05/24/2015 07:54   Ct Abdomen Pelvis W Contrast  05/24/2015  CLINICAL DATA:  Generalized abdominal pain. EXAM: CT ABDOMEN AND PELVIS WITH CONTRAST TECHNIQUE: Multidetector CT imaging of the abdomen and pelvis was performed using the standard protocol following bolus administration of intravenous contrast. CONTRAST:  50mL OMNIPAQUE IOHEXOL 300 MG/ML SOLN, OMNIPAQUE IOHEXOL 300 MG/ML SOLN COMPARISON:  Ultrasound of April 28, 2015. FINDINGS: Visualized lung bases are unremarkable. No significant osseous abnormalities noted. No gallstones are noted. Minimally nodular hepatic margins are noted concerning for hepatic cirrhosis. Multiple small diffuse low densities are noted throughout hepatic parenchyma which may represent small cysts as they are present on delayed images. Moderate splenomegaly is noted. Pancreas appears normal. Adrenal glands and kidneys appear normal. No hydronephrosis or renal obstruction is noted. Moderate amount of ascites is noted around the liver as well as in the pelvis. There is no evidence of bowel obstruction. Uterus and ovaries appear normal. Urinary bladder is unremarkable. No significant adenopathy is noted. IMPRESSION: Findings  are concerning for hepatic cirrhosis with associated splenomegaly and ascites. Innumerable small low densities are noted in the hepatic parenchyma which most likely represent small cysts in the absence of any history of malignancy. If the patient does have a history of malignancy, MRI is recommended to evaluate for possible metastatic disease. Electronically Signed   By: Lupita Raider, M.D.   On: 05/24/2015 12:31   I have personally reviewed and evaluated these images and lab results as part of my medical decision-making.    MDM   Final diagnoses:  Ascites  UTI (lower urinary tract infection)    Patient's laboratory tests are significant for worsening bilirubin. Patient does have history of elevated LFTs.  She does have history of alcohol abuse and hepatitis C. Patient has plans to follow up with her gastroenterologist.  CT scan today does show evidence of ascites and cirrhosis.  Patient states her primary doctor had ordered this test.  This was performed in the ED today because she was not able to get as an outpatient.  Laboratory tests are consistent with a urinary tract infection. I we will Give the patient a prescription for Keflex. Plan on close outpatient follow-up.    Linwood Dibbles, MD 05/24/15 [phone]

## 2015-05-26 LAB — URINE CULTURE

## 2015-05-27 ENCOUNTER — Telehealth (HOSPITAL_BASED_OUTPATIENT_CLINIC_OR_DEPARTMENT_OTHER): Payer: Self-pay | Admitting: Emergency Medicine

## 2015-05-27 NOTE — Telephone Encounter (Signed)
Post ED Visit - Positive Culture Follow-up  Culture report reviewed by antimicrobial stewardship pharmacist:  []  Katrina Cohen, Pharm.D. []  Katrina Cohen, Pharm.D., Katrina Cohen []  Katrina Cohen, Pharm.D. [x]  Katrina Cohen, Pharm.D., Katrina Cohen []  [name], [address] Katrina Cohen, AAHIVP []  Estella HuskMichelle Cohen, Pharm.D., Katrina Cohen, AAHIVP []  Katrina Cohen, Pharm.D. []  Katrina Cohen, [address]  Positive urine culture Klebsiella Treated with cephalexin, organism sensitive to the same and no further patient follow-up is required at this time.  [name], [name] 05/27/2015, 10:22 AM

## 2015-06-12 ENCOUNTER — Other Ambulatory Visit (HOSPITAL_COMMUNITY): Payer: Self-pay | Admitting: Internal Medicine

## 2015-06-12 DIAGNOSIS — K7031 Alcoholic cirrhosis of liver with ascites: Secondary | ICD-10-CM

## 2015-06-17 ENCOUNTER — Ambulatory Visit (HOSPITAL_COMMUNITY)
Admission: RE | Admit: 2015-06-17 | Discharge: 2015-06-17 | Disposition: A | Discharge status: Home / Self Care | Payer: Medicaid Other | Source: Ambulatory Visit | Attending: Internal Medicine | Admitting: Internal Medicine

## 2015-06-17 DIAGNOSIS — R188 Other ascites: Secondary | ICD-10-CM | POA: Diagnosis present

## 2015-06-17 DIAGNOSIS — K7031 Alcoholic cirrhosis of liver with ascites: Secondary | ICD-10-CM | POA: Diagnosis not present

## 2015-06-17 LAB — BODY FLUID CELL COUNT WITH DIFFERENTIAL
Lymphs, Fluid: 20 %
MONOCYTE-MACROPHAGE-SEROUS FLUID: 79 % (ref 50–90)
Neutrophil Count, Fluid: 1 % (ref 0–25)
WBC FLUID: 101 uL (ref 0–1000)

## 2015-06-17 LAB — GRAM STAIN

## 2015-06-17 LAB — PROTEIN, BODY FLUID

## 2015-06-17 LAB — ALBUMIN, FLUID (OTHER): Albumin, Fluid: <1 g/dL

## 2015-06-17 MED ORDER — LIDOCAINE HCL (PF) 1 % IJ SOLN
INTRAMUSCULAR | Status: AC
  Filled 2015-06-17: qty 10

## 2015-06-17 MED ORDER — ALBUMIN HUMAN 25 % IV SOLN
25.0000 g | Freq: Once | INTRAVENOUS | Status: AC
  Administered 2015-06-17: 25 g via INTRAVENOUS
  Filled 2015-06-17: qty 100

## 2015-06-17 NOTE — Procedures (Addendum)
CLINICAL DATA:  [Cirrhosis, ascites, abdominal distention] EXAM:  ULTRASOUND GUIDED PARACENTESIS GRIP-IR:  Category: Fluids Subcategory: [Paracentesis] TECHNIQUE:  The procedure, risks (including but not limited to bleeding, infection, organ damage ), benefits, and alternatives were explained to the patient. Questions regarding the procedure were encouraged and answered. The patient understands and consents to the procedure.  Survey ultrasound of the abdomen was performed and an appropriate skin entry site in the [place] lateral] abdomen was selected. Skin site was marked, prepped with Betadine, and draped in usual sterile fashion, and infiltrated locally with 1% lidocaine. A Safe-T-Centesis sheath needle was advanced into the peritoneal space until fluid could be aspirated. The sheath was advanced and the needle removed. [5.7 L] of [clear yellow]ascites were aspirated.  COMPLICATIONS  none IMPRESSION:  Technically successful ultrasound guided paracentesis, removing [5.7 L] of ascites.

## 2015-06-17 NOTE — Progress Notes (Signed)
Albumin infusion complete. Pt tolerated well.

## 2015-06-18 LAB — AMYLASE, PERITONEAL FLUID: Amylase, peritoneal fluid: 16 U/L

## 2015-06-18 LAB — PATHOLOGIST SMEAR REVIEW

## 2015-06-22 LAB — CULTURE, BODY FLUID-BOTTLE: CULTURE: NO GROWTH

## 2015-06-22 LAB — CULTURE, BODY FLUID W GRAM STAIN -BOTTLE

## 2015-07-22 ENCOUNTER — Other Ambulatory Visit (HOSPITAL_COMMUNITY): Payer: Self-pay | Admitting: Internal Medicine

## 2015-07-22 DIAGNOSIS — K7031 Alcoholic cirrhosis of liver with ascites: Secondary | ICD-10-CM

## 2015-07-22 DIAGNOSIS — K7011 Alcoholic hepatitis with ascites: Secondary | ICD-10-CM

## 2015-07-22 DIAGNOSIS — R188 Other ascites: Secondary | ICD-10-CM

## 2015-07-25 ENCOUNTER — Ambulatory Visit (HOSPITAL_COMMUNITY)
Admission: RE | Admit: 2015-07-25 | Discharge: 2015-07-25 | Disposition: A | Discharge status: Home / Self Care | Payer: Medicaid Other | Source: Ambulatory Visit | Attending: Internal Medicine | Admitting: Internal Medicine

## 2015-07-25 DIAGNOSIS — K7460 Unspecified cirrhosis of liver: Secondary | ICD-10-CM | POA: Insufficient documentation

## 2015-07-25 DIAGNOSIS — K7011 Alcoholic hepatitis with ascites: Secondary | ICD-10-CM

## 2015-07-25 DIAGNOSIS — R188 Other ascites: Secondary | ICD-10-CM

## 2015-07-25 DIAGNOSIS — K7031 Alcoholic cirrhosis of liver with ascites: Secondary | ICD-10-CM

## 2015-07-25 LAB — BODY FLUID CELL COUNT WITH DIFFERENTIAL
EOS FL: NONE SEEN %
LYMPHS FL: 57 %
MONOCYTE-MACROPHAGE-SEROUS FLUID: 35 % — AB (ref 50–90)
NEUTROPHIL FLUID: 8 % (ref 0–25)
Total Nucleated Cell Count, Fluid: 272 cu mm (ref 0–1000)

## 2015-07-25 MED ORDER — LIDOCAINE HCL (PF) 1 % IJ SOLN
INTRAMUSCULAR | Status: AC
  Filled 2015-07-25: qty 10

## 2015-07-25 NOTE — Procedures (Signed)
Ultrasound-guided diagnostic and therapeutic paracentesis performed yielding 1.3 liters of clear yellow colored fluid. No immediate complications.  Rue Tinnel E 12:01 PM 07/25/2015

## 2015-07-29 LAB — PATHOLOGIST SMEAR REVIEW

## 2015-08-01 LAB — AFB CULTURE WITH SMEAR (NOT AT ARMC): Acid Fast Smear: NONE SEEN

## 2015-09-02 ENCOUNTER — Other Ambulatory Visit (HOSPITAL_COMMUNITY): Payer: Self-pay | Admitting: Internal Medicine

## 2015-09-02 DIAGNOSIS — R188 Other ascites: Secondary | ICD-10-CM

## 2015-09-02 DIAGNOSIS — K7031 Alcoholic cirrhosis of liver with ascites: Secondary | ICD-10-CM

## 2015-09-03 ENCOUNTER — Other Ambulatory Visit (HOSPITAL_COMMUNITY): Payer: Self-pay | Admitting: Internal Medicine

## 2015-09-03 ENCOUNTER — Ambulatory Visit (HOSPITAL_COMMUNITY)
Admission: RE | Admit: 2015-09-03 | Discharge: 2015-09-03 | Disposition: A | Discharge status: Home / Self Care | Payer: Medicaid Other | Source: Ambulatory Visit | Attending: Internal Medicine | Admitting: Internal Medicine

## 2015-09-03 DIAGNOSIS — R188 Other ascites: Secondary | ICD-10-CM | POA: Diagnosis not present

## 2015-09-03 DIAGNOSIS — K7031 Alcoholic cirrhosis of liver with ascites: Secondary | ICD-10-CM | POA: Insufficient documentation

## 2015-09-03 MED ORDER — LIDOCAINE HCL (PF) 1 % IJ SOLN
INTRAMUSCULAR | Status: AC
  Filled 2015-09-03: qty 10

## 2015-09-03 NOTE — Progress Notes (Signed)
Patient ID: Jory EeJennifer Cohen, female   DOB: 11-30-1965, 50 y.o.   MRN: [id number]   Pt scheduled for OP paracentesis today  Limited Abd US reveals very small amount fluid next to liver No other fluid pocket in abd  Window was small and riskful NO paracentesis today  Rec: if abd pain sxs cont to worsen; Consider re imaging with US to see if ascites has accumulated in an area or amount that would be safe to access.

## 2015-11-06 ENCOUNTER — Emergency Department (HOSPITAL_COMMUNITY): Payer: Medicaid Other

## 2015-11-06 ENCOUNTER — Inpatient Hospital Stay (HOSPITAL_COMMUNITY)
Admission: EM | Admit: 2015-11-06 | Discharge: 2015-11-08 | DRG: 194 | Disposition: A | Discharge status: Home / Self Care | Payer: Medicaid Other | Attending: Internal Medicine | Admitting: Internal Medicine

## 2015-11-06 ENCOUNTER — Encounter (HOSPITAL_COMMUNITY): Payer: Self-pay | Admitting: *Deleted

## 2015-11-06 DIAGNOSIS — E119 Type 2 diabetes mellitus without complications: Secondary | ICD-10-CM | POA: Diagnosis present

## 2015-11-06 DIAGNOSIS — E860 Dehydration: Secondary | ICD-10-CM | POA: Diagnosis present

## 2015-11-06 DIAGNOSIS — R0902 Hypoxemia: Secondary | ICD-10-CM | POA: Diagnosis present

## 2015-11-06 DIAGNOSIS — F17210 Nicotine dependence, cigarettes, uncomplicated: Secondary | ICD-10-CM | POA: Diagnosis present

## 2015-11-06 DIAGNOSIS — E872 Acidosis: Secondary | ICD-10-CM | POA: Diagnosis present

## 2015-11-06 DIAGNOSIS — Z833 Family history of diabetes mellitus: Secondary | ICD-10-CM

## 2015-11-06 DIAGNOSIS — I10 Essential (primary) hypertension: Secondary | ICD-10-CM | POA: Diagnosis present

## 2015-11-06 DIAGNOSIS — F319 Bipolar disorder, unspecified: Secondary | ICD-10-CM | POA: Diagnosis present

## 2015-11-06 DIAGNOSIS — K7460 Unspecified cirrhosis of liver: Secondary | ICD-10-CM | POA: Diagnosis present

## 2015-11-06 DIAGNOSIS — Z825 Family history of asthma and other chronic lower respiratory diseases: Secondary | ICD-10-CM

## 2015-11-06 DIAGNOSIS — R188 Other ascites: Secondary | ICD-10-CM | POA: Diagnosis present

## 2015-11-06 DIAGNOSIS — Z809 Family history of malignant neoplasm, unspecified: Secondary | ICD-10-CM

## 2015-11-06 DIAGNOSIS — D696 Thrombocytopenia, unspecified: Secondary | ICD-10-CM | POA: Diagnosis present

## 2015-11-06 DIAGNOSIS — J189 Pneumonia, unspecified organism: Principal | ICD-10-CM | POA: Diagnosis present

## 2015-11-06 DIAGNOSIS — D649 Anemia, unspecified: Secondary | ICD-10-CM | POA: Diagnosis present

## 2015-11-06 HISTORY — DX: Unspecified cirrhosis of liver: K74.60

## 2015-11-06 MED ORDER — ALBUTEROL SULFATE (2.5 MG/3ML) 0.083% IN NEBU
5.0000 mg | INHALATION_SOLUTION | Freq: Once | RESPIRATORY_TRACT | Status: AC
  Administered 2015-11-06: 5 mg via RESPIRATORY_TRACT
  Filled 2015-11-06: qty 6

## 2015-11-06 NOTE — ED Notes (Signed)
Pt states that she was diagnosed with Pneumonia yesterday; pt states that she is feeling more short of breath and feels harder to breathe today; pt reports non-productive cough

## 2015-11-07 DIAGNOSIS — F319 Bipolar disorder, unspecified: Secondary | ICD-10-CM | POA: Diagnosis present

## 2015-11-07 DIAGNOSIS — D649 Anemia, unspecified: Secondary | ICD-10-CM | POA: Diagnosis present

## 2015-11-07 DIAGNOSIS — Z825 Family history of asthma and other chronic lower respiratory diseases: Secondary | ICD-10-CM | POA: Diagnosis not present

## 2015-11-07 DIAGNOSIS — F17210 Nicotine dependence, cigarettes, uncomplicated: Secondary | ICD-10-CM | POA: Diagnosis present

## 2015-11-07 DIAGNOSIS — D696 Thrombocytopenia, unspecified: Secondary | ICD-10-CM

## 2015-11-07 DIAGNOSIS — E119 Type 2 diabetes mellitus without complications: Secondary | ICD-10-CM | POA: Diagnosis present

## 2015-11-07 DIAGNOSIS — K7031 Alcoholic cirrhosis of liver with ascites: Secondary | ICD-10-CM | POA: Diagnosis not present

## 2015-11-07 DIAGNOSIS — R0902 Hypoxemia: Secondary | ICD-10-CM | POA: Diagnosis present

## 2015-11-07 DIAGNOSIS — I10 Essential (primary) hypertension: Secondary | ICD-10-CM | POA: Diagnosis present

## 2015-11-07 DIAGNOSIS — E872 Acidosis: Secondary | ICD-10-CM | POA: Diagnosis present

## 2015-11-07 DIAGNOSIS — K7460 Unspecified cirrhosis of liver: Secondary | ICD-10-CM

## 2015-11-07 DIAGNOSIS — R188 Other ascites: Secondary | ICD-10-CM | POA: Diagnosis present

## 2015-11-07 DIAGNOSIS — R0602 Shortness of breath: Secondary | ICD-10-CM | POA: Diagnosis present

## 2015-11-07 DIAGNOSIS — J189 Pneumonia, unspecified organism: Principal | ICD-10-CM

## 2015-11-07 DIAGNOSIS — E860 Dehydration: Secondary | ICD-10-CM | POA: Diagnosis present

## 2015-11-07 DIAGNOSIS — Z809 Family history of malignant neoplasm, unspecified: Secondary | ICD-10-CM | POA: Diagnosis not present

## 2015-11-07 DIAGNOSIS — Z833 Family history of diabetes mellitus: Secondary | ICD-10-CM | POA: Diagnosis not present

## 2015-11-07 LAB — GLUCOSE, CAPILLARY
GLUCOSE-CAPILLARY: 240 mg/dL — AB (ref 65–99)
GLUCOSE-CAPILLARY: 338 mg/dL — AB (ref 65–99)
GLUCOSE-CAPILLARY: 326 mg/dL — AB (ref 65–99)
GLUCOSE-CAPILLARY: 308 mg/dL — AB (ref 65–99)
GLUCOSE-CAPILLARY: 236 mg/dL — AB (ref 65–99)

## 2015-11-07 LAB — COMPREHENSIVE METABOLIC PANEL
ALBUMIN: 2.9 g/dL — AB (ref 3.5–5.0)
ALT: 17 U/L (ref 14–54)
AST: 33 U/L (ref 15–41)
Alkaline Phosphatase: 133 U/L — ABNORMAL HIGH (ref 38–126)
Anion gap: 7 (ref 5–15)
BUN: 7 mg/dL (ref 6–20)
CHLORIDE: 101 mmol/L (ref 101–111)
CO2: 25 mmol/L (ref 22–32)
Calcium: 8.9 mg/dL (ref 8.9–10.3)
Creatinine, Ser: 0.71 mg/dL (ref 0.44–1.00)
GFR calc Af Amer: >60 mL/min (ref >60)
GFR calc non Af Amer: >60 mL/min (ref >60)
GLUCOSE: 185 mg/dL — AB (ref 65–99)
POTASSIUM: 3.9 mmol/L (ref 3.5–5.1)
Sodium: 133 mmol/L — ABNORMAL LOW (ref 135–145)
Total Bilirubin: 6.7 mg/dL — ABNORMAL HIGH (ref 0.3–1.2)
Total Protein: 6.7 g/dL (ref 6.5–8.1)

## 2015-11-07 LAB — CBC WITH DIFFERENTIAL/PLATELET
BASOS ABS: 0 10*3/uL (ref 0.0–0.1)
BASOS PCT: 0 %
EOS PCT: 3 %
Eosinophils Absolute: 0.2 10*3/uL (ref 0.0–0.7)
HCT: 26.9 % — ABNORMAL LOW (ref 36.0–46.0)
Hemoglobin: 9.2 g/dL — ABNORMAL LOW (ref 12.0–15.0)
Lymphocytes Relative: 19 %
Lymphs Abs: 0.9 10*3/uL (ref 0.7–4.0)
MCH: 31.5 pg (ref 26.0–34.0)
MCHC: 34.2 g/dL (ref 30.0–36.0)
MCV: 92.1 fL (ref 78.0–100.0)
MONO ABS: 0.4 10*3/uL (ref 0.1–1.0)
Monocytes Relative: 9 %
NEUTROS ABS: 3.2 10*3/uL (ref 1.7–7.7)
Neutrophils Relative %: 68 %
PLATELETS: 95 10*3/uL — AB (ref 150–400)
RBC: 2.92 MIL/uL — AB (ref 3.87–5.11)
RDW: 14.9 % (ref 11.5–15.5)
WBC: 4.6 10*3/uL (ref 4.0–10.5)

## 2015-11-07 LAB — PROTIME-INR
INR: 1.9 — AB (ref 0.00–1.49)
Prothrombin Time: 21.1 seconds — ABNORMAL HIGH (ref 11.6–15.2)

## 2015-11-07 LAB — HIV ANTIBODY (ROUTINE TESTING W REFLEX): HIV Screen 4th Generation wRfx: NONREACTIVE

## 2015-11-07 LAB — LACTIC ACID, PLASMA: Lactic Acid, Venous: 3.7 mmol/L (ref 0.5–2.0)

## 2015-11-07 MED ORDER — BENZONATATE 100 MG PO CAPS
100.0000 mg | ORAL_CAPSULE | Freq: Three times a day (TID) | ORAL | Status: DC | PRN
  Administered 2015-11-07: 100 mg via ORAL
  Filled 2015-11-07: qty 1

## 2015-11-07 MED ORDER — INSULIN ASPART 100 UNIT/ML ~~LOC~~ SOLN
0.0000 [IU] | Freq: Three times a day (TID) | SUBCUTANEOUS | Status: DC
  Administered 2015-11-07: 11 [IU] via SUBCUTANEOUS
  Administered 2015-11-07: 5 [IU] via SUBCUTANEOUS
  Administered 2015-11-07: 11 [IU] via SUBCUTANEOUS
  Administered 2015-11-08: 2 [IU] via SUBCUTANEOUS

## 2015-11-07 MED ORDER — DEXTROSE 5 % IV SOLN
1.0000 g | INTRAVENOUS | Status: DC
  Administered 2015-11-08: 1 g via INTRAVENOUS
  Filled 2015-11-07: qty 10

## 2015-11-07 MED ORDER — SODIUM CHLORIDE 0.9 % IV SOLN
INTRAVENOUS | Status: DC
  Administered 2015-11-07: 07:00:00 via INTRAVENOUS

## 2015-11-07 MED ORDER — IPRATROPIUM-ALBUTEROL 0.5-2.5 (3) MG/3ML IN SOLN
3.0000 mL | Freq: Four times a day (QID) | RESPIRATORY_TRACT | Status: DC
  Administered 2015-11-07: 3 mL via RESPIRATORY_TRACT
  Filled 2015-11-07: qty 3

## 2015-11-07 MED ORDER — PREDNISONE 20 MG PO TABS
40.0000 mg | ORAL_TABLET | Freq: Every day | ORAL | Status: DC
  Administered 2015-11-08: 40 mg via ORAL
  Filled 2015-11-07 (×2): qty 2

## 2015-11-07 MED ORDER — ARIPIPRAZOLE 5 MG PO TABS
5.0000 mg | ORAL_TABLET | ORAL | Status: DC
  Administered 2015-11-07: 5 mg via ORAL
  Filled 2015-11-07 (×2): qty 1

## 2015-11-07 MED ORDER — IPRATROPIUM-ALBUTEROL 0.5-2.5 (3) MG/3ML IN SOLN
3.0000 mL | Freq: Once | RESPIRATORY_TRACT | Status: AC
  Administered 2015-11-07: 3 mL via RESPIRATORY_TRACT
  Filled 2015-11-07: qty 3

## 2015-11-07 MED ORDER — CITALOPRAM HYDROBROMIDE 20 MG PO TABS
20.0000 mg | ORAL_TABLET | Freq: Every day | ORAL | Status: DC
  Filled 2015-11-07: qty 1

## 2015-11-07 MED ORDER — SPIRONOLACTONE 12.5 MG HALF TABLET
12.5000 mg | ORAL_TABLET | Freq: Every day | ORAL | Status: DC
Start: 2015-11-07 — End: 2015-11-08
  Administered 2015-11-08: 12.5 mg via ORAL
  Filled 2015-11-07 (×2): qty 1

## 2015-11-07 MED ORDER — GLUCERNA SHAKE PO LIQD
237.0000 mL | Freq: Three times a day (TID) | ORAL | Status: DC
  Administered 2015-11-07 (×2): 237 mL via ORAL
  Filled 2015-11-07 (×4): qty 237

## 2015-11-07 MED ORDER — LORAZEPAM 1 MG PO TABS
1.0000 mg | ORAL_TABLET | ORAL | Status: DC
  Administered 2015-11-07: 1 mg via ORAL
  Filled 2015-11-07: qty 1

## 2015-11-07 MED ORDER — AZITHROMYCIN 250 MG PO TABS
250.0000 mg | ORAL_TABLET | Freq: Every day | ORAL | Status: DC
  Administered 2015-11-07 – 2015-11-08 (×2): 250 mg via ORAL
  Filled 2015-11-07 (×2): qty 1

## 2015-11-07 MED ORDER — IPRATROPIUM-ALBUTEROL 0.5-2.5 (3) MG/3ML IN SOLN
3.0000 mL | Freq: Three times a day (TID) | RESPIRATORY_TRACT | Status: DC
  Administered 2015-11-07 – 2015-11-08 (×2): 3 mL via RESPIRATORY_TRACT
  Filled 2015-11-07 (×2): qty 3

## 2015-11-07 MED ORDER — OXYCODONE HCL 5 MG PO TABS: 5.0000 mg | ORAL_TABLET | Freq: Four times a day (QID) | ORAL | Status: DC | PRN

## 2015-11-07 MED ORDER — SUCRALFATE 1 GM/10ML PO SUSP: 1.0000 g | Freq: Four times a day (QID) | ORAL | Status: DC

## 2015-11-07 MED ORDER — METHYLPREDNISOLONE SODIUM SUCC 125 MG IJ SOLR
60.0000 mg | Freq: Four times a day (QID) | INTRAMUSCULAR | Status: DC
  Filled 2015-11-07 (×4): qty 0.96

## 2015-11-07 MED ORDER — ONDANSETRON HCL 4 MG/2ML IJ SOLN
4.0000 mg | Freq: Four times a day (QID) | INTRAMUSCULAR | Status: DC | PRN
  Administered 2015-11-08 (×2): 4 mg via INTRAVENOUS
  Filled 2015-11-07 (×2): qty 2

## 2015-11-07 MED ORDER — LORAZEPAM 1 MG PO TABS: 1.0000 mg | ORAL_TABLET | Freq: Every day | ORAL | Status: DC

## 2015-11-07 MED ORDER — CITALOPRAM HYDROBROMIDE 20 MG PO TABS
20.0000 mg | ORAL_TABLET | ORAL | Status: DC
  Administered 2015-11-07: 20 mg via ORAL
  Filled 2015-11-07 (×2): qty 1

## 2015-11-07 MED ORDER — ARIPIPRAZOLE 5 MG PO TABS
5.0000 mg | ORAL_TABLET | Freq: Every day | ORAL | Status: DC
  Filled 2015-11-07: qty 1

## 2015-11-07 MED ORDER — DEXTROSE 5 % IV SOLN
1.0000 g | Freq: Once | INTRAVENOUS | Status: AC
  Administered 2015-11-07: 1 g via INTRAVENOUS
  Filled 2015-11-07: qty 10

## 2015-11-07 MED ORDER — ALBUTEROL SULFATE (2.5 MG/3ML) 0.083% IN NEBU
2.5000 mg | INHALATION_SOLUTION | Freq: Once | RESPIRATORY_TRACT | Status: AC
  Administered 2015-11-07: 2.5 mg via RESPIRATORY_TRACT
  Filled 2015-11-07: qty 3

## 2015-11-07 MED ORDER — METHYLPREDNISOLONE SODIUM SUCC 125 MG IJ SOLR
125.0000 mg | Freq: Once | INTRAMUSCULAR | Status: AC
  Administered 2015-11-07: 125 mg via INTRAVENOUS
  Filled 2015-11-07: qty 2

## 2015-11-07 NOTE — Progress Notes (Signed)
CRITICAL VALUE ALERT  Critical value received: Lactic Acid 3.7  Date of notification:  6/16  Time of notification:  0847  Critical value read back:Yes.    Nurse who received alert:  Orlena SheldonEmily Tobin Witucki  MD notified (1st page):  [phone] Rizwan   Time of first page:  [phone]  MD notified (2nd page):  Time of second page: Justin Mendaudle, Arris Meyn H, RN Responding MD:    Time MD responded:

## 2015-11-07 NOTE — ED Notes (Signed)
Pt ambulated spo2 was not above 86. Provider notified.

## 2015-11-07 NOTE — H&P (Signed)
History and Physical    Katrina Cohen [id number] DOB: May 27, 1965 DOA: 11/06/2015  PCP: Deneen HartsElizabeth Todd at [facility] [place], phone number [phone]  Patient coming from: Home  Chief Complaint: Cough, shortness of breath, hypoxia  HPI: Katrina Cohen is a 50 y.o. woman with a history of HTN, DM, cirrhosis based on CT imaging (patient tells me that she has not had a biopsy; [facility] bipolar disorder with depression, and EtOH and tobacco abuse who developed dry cough and sore throat on Monday.  She saw her PCP on Wednesday and was initially diagnosed with bronchitis.  She was given a prescription for azithromycin.  She was sent for labs and imaging, and reports that she was notified of an abnormal chest xray, concerning for pneumonia.  Reportedly, antibiotic was not changed, but she was advised to monitor her symptoms and report to the ED for any worsening in her condition.  Today, she has had increased wheezing, progressive shortness of breath, low grade fever to 99 associated with chills and sweats.  No nausea or vomiting.  No abdominal pain.  No lower urinary tract symptoms.  ED Course: Chest xray concerning for right lower lobe infiltrate.  Patient hypoxic with new oxygen requirement after IV solumedrol and two nebulizer treatments.  She desats to 80% with exertion.  Hospitalist asked to admit.  Review of Systems: She has lost 50 lbs in the last six months.  She has recurrent ascites (S/P paracentesis x 2).  Intermittent lower extremity edema.  She is not on home oxygen.  Otherwise, 10 systems reviewed and negative except as stated in the HPI.  Past Medical History  Diagnosis Date  . Bipolar disorder (HCC) 1980s    with depression, anxiety  . Diabetes mellitus   . Hypertension   . Depression with anxiety 1980s    [facility]    Marland Kitchen. Dysphagia 11/2014    normal esophagram 11/2014. EGD per Dr Dulce Sellarutlaw.   . Jaundice 04/2015    In setting of infectious  mononucleosis. Ultrasound in [place]: hepatosplenomegaly, portal htn, possible cirrhosis  . Mononucleosis 04/15/2015  . Cirrhosis (HCC)   Denies history of COPD  Past Surgical History  Procedure Laterality Date  . Cesarean section    . Appendectomy    . Colonoscopy    . Esophagogastroduodenoscopy      Dr Dulce Sellarutlaw ? 2016.     SOCIAL HISTORY: Active tobacco use.  History of EtOH abuse; stopped drinking November 2016.  No illicit drug use.  She is separated from her husband.  She has one adult son.   No Known Allergies  Family History  Problem Relation Age of Onset  . Diabetes Mother   . Cancer Father   . Diabetes Sister   . Diabetes Brother   Sister had COPD  Prior to Admission medications   Medication Sig Start Date End Date Taking? Authorizing Provider  albuterol (PROVENTIL HFA;VENTOLIN HFA) 108 (90 Base) MCG/ACT inhaler Inhale 2 puffs into the lungs every 4 (four) hours as needed for wheezing or shortness of breath.  09/02/15  Yes Historical Provider, MD  ARIPiprazole (ABILIFY) 5 MG tablet Take 5 mg by mouth daily. 04/02/15  Yes Historical Provider, MD  azithromycin (ZITHROMAX) 250 MG tablet Take 250-500 mg by mouth daily. Dose pack. Take two tablets on day one and then one tablet daily x4 days.   Yes Historical Provider, MD  citalopram (CELEXA) 20 MG tablet Take 20 mg by mouth daily.  Yes Historical Provider, MD  LORazepam (ATIVAN) 1 MG tablet Take 1 mg by mouth at bedtime.    Yes Historical Provider, MD  sucralfate (CARAFATE) 1 GM/10ML suspension Take 10 mLs by mouth 4 (four) times daily. 04/09/15 05/09/15  Historical Provider, MD    Physical Exam: Filed Vitals:   11/06/15 2237 11/07/15 0306 11/07/15 0340  BP: 114/65 106/55   Pulse: 89 89   Temp: 99.2 F (37.3 C) 99.2 F (37.3 C)   TempSrc: Oral Oral   Resp: Height:  (1.626 m)    Weight: 63.05 kg (139 lb)    SpO2: 96% 91% 94%      Constitutional: ill appearing but NAD  Filed Vitals:   11/06/15  2237 11/07/15 0306 11/07/15 0340  BP: 114/65 106/55   Pulse: 89 89   Temp: 99.2 F (37.3 C) 99.2 F (37.3 C)   TempSrc: Oral Oral   Resp: Height:  (1.626 m)    Weight: 63.05 kg (139 lb)    SpO2: 96% 91% 94%   Eyes: PERRL, lids and conjunctivae normal ENMT: Mucous membranes are moist. Posterior pharynx clear of any exudate or lesions.Normal dentition.  Neck: normal, supple Respiratory: Bibasilar crackles.  No significant wheeze on my exam.  Breathing unlabored.  No use of accessory muscles. Cardiovascular: Regular rate and rhythm, no murmurs / rubs / gallops. No extremity edema. 2+ pedal pulses. Abdomen: Protuberant but soft and compressible.  No tenderness.  Bowel sounds are present. Musculoskeletal: no clubbing / cyanosis. No joint deformity upper and lower extremities. Good ROM, no contractures. Normal muscle tone.  Skin: no rashes, pale but warm to touch Neurologic: No focal deficits Psychiatric: Normal judgment and insight. Alert and oriented x 3. Normal mood.    Labs on Admission: I have personally reviewed following labs and imaging studies  CBC:  Recent Labs Lab 11/07/15 0138  WBC 4.6  NEUTROABS 3.2  HGB 9.2*  HCT 26.9*  MCV 92.1  PLT 95*   Basic Metabolic Panel:  Recent Labs Lab 11/07/15 0138  NA 133*  K 3.9  CL 101  CO2 25  GLUCOSE 185*  BUN 7  CREATININE 0.71  CALCIUM 8.9   GFR: Estimated Creatinine Clearance: 72.6 mL/min (by C-G formula based on Cr of 0.71). Liver Function Tests:  Recent Labs Lab 11/07/15 0138  AST 33  ALT 17  ALKPHOS 133*  BILITOT 6.7*  PROT 6.7  ALBUMIN 2.9*    Radiological Exams on Admission: Dg Chest 2 View  11/07/2015  CLINICAL DATA:  Non productive cough and congestion. Shortness of breath. EXAM: CHEST  2 VIEW COMPARISON:  11/05/2015 FINDINGS: There is an azygos lobe. Heart size is within normal limits and stable. Increased densities along the medial right lung base. No large pleural effusions. No  acute bone abnormality. IMPRESSION: Subtle densities along the medial right lung base. Differential include atelectasis versus small focus of infection. Electronically Signed   By: Richarda Overlie M.D.   On: 11/07/2015 00:29    EKG:  Date: 11/07/2015 Rate: 86 Rhythm: normal sinus rhythm QRS Axis: normal Intervals: borderline QT prolong ST/T Wave abnormalities: normal Conduction Disutrbances:none Narrative Interpretation:  Old EKG Reviewed: none available Per ED documentation; image unavailable for personal review in the EMR  Assessment/Plan Principal Problem:   CAP (community acquired pneumonia) Active Problems:   Type 2 diabetes mellitus (HCC)   Anemia   Cirrhosis (HCC)   Thrombocytopenia (HCC)     CAP --  Continue IV Rocephin plus oral azithromycin --Blood cultures --Supplemental oxygen as needed to keep O2 sats greater than 92% --IV solumedrol 60mg  q6h, taper quickly --Duonebs QID --Check lactic acid to screen for sepsis --Respiratory therapy, IS use --Urine legionella and streptococcal antigens --Check HIV --Small fluid challenge 100cc/hr x 5 hours  Cirrhosis --Pharmacy asked to verify home dose of spironolactone --Check PT/INR --Does not appear to need paracentesis at this time --Has [facility] hepatology follow-up later this month --Anemia and thrombocytopenia likely related to liver disease.  Avoid NSAIDs and anticoagulants.  DM --Diabetic diet --SSI AC/HS, particularly since she is getting steroids  DVT prophylaxis: SCDs Code Status: FULL Family Communication: Son, friend at bedside in the ED at time of admission Disposition Plan: Home when ready Consults called: NONE Admission status: Inpatient, med surg   Jerene Bears MD [facility]  If 7PM-7AM, please contact night-coverage [email or web address] Password [facility]  11/07/2015, 5:51 AM

## 2015-11-07 NOTE — ED Provider Notes (Signed)
CSN: [id number]     Arrival date & time 11/06/15  2228 History  By signing my name below, I, Katrina Cohen, attest that this documentation has been prepared under the direction and in the presence of [name], MD.  Electronically Signed: [name] Lyn Cohen, ED Scribe. 11/07/2015. 1:21 AM.     Chief Complaint  Patient presents with  . Shortness of Breath    The history is provided by the patient. No language interpreter was used.    HPI Comments: Katrina Cohen is a 50 y.o. female with PMHx of DM and HTN and current smoker who presents to the Emergency Department complaining of gradual onset, constant, worsening shortness of breath x 1 day. Pt reports she was diagnosed on 11/06/15 for pneumonia, where she was prescribed Azithromycin by PCP. She states that she is feeling more short of breath with a non-productive cough. After given albuterol treatment in ED, she reports having mild relief of symptoms. Denies any respiratory PMHx.  Past Medical History  Diagnosis Date  . Bipolar disorder (HCC) 1980s    with depression, anxiety  . Diabetes mellitus   . Hypertension   . Depression with anxiety 1980s    [facility]    Marland Kitchen. Dysphagia 11/2014    normal esophagram 11/2014. EGD per Dr Dulce Sellarutlaw.   . Jaundice 04/2015    In setting of infectious mononucleosis. Ultrasound in [place]: hepatosplenomegaly, portal htn, possible cirrhosis  . Mononucleosis 04/15/2015  . Cirrhosis [facility](HCC)    Past Surgical History  Procedure Laterality Date  . Cesarean section    . Appendectomy    . Colonoscopy    . Esophagogastroduodenoscopy      Dr Dulce Sellarutlaw ? 2016.    Family History  Problem Relation Age of Onset  . Diabetes Mother   . Cancer Father   . Diabetes Sister   . Diabetes Brother    Social History  Substance Use Topics  . Smoking status: Current Every Day Smoker -- 1.00 packs/day    Types: Cigarettes  . Smokeless tobacco: None  . Alcohol Use: Yes     Comment: occasionally    OB History    No data available     Review of Systems  Respiratory: Positive for cough and shortness of breath.   Cardiovascular: Negative for chest pain.  All other systems reviewed and are negative.     Allergies  Review of patient's allergies indicates no known allergies.  Home Medications   Prior to Admission medications   Medication Sig Start Date End Date Taking? Authorizing Provider  albuterol (PROVENTIL HFA;VENTOLIN HFA) 108 (90 Base) MCG/ACT inhaler Inhale 2 puffs into the lungs every 4 (four) hours as needed for wheezing or shortness of breath.  09/02/15  Yes Historical Provider, MD  ARIPiprazole (ABILIFY) 5 MG tablet Take 5 mg by mouth daily. 04/02/15  Yes Historical Provider, MD  azithromycin (ZITHROMAX) 250 MG tablet Take 250-500 mg by mouth daily. Dose pack. Take two tablets on day one and then one tablet daily x4 days.   Yes Historical Provider, MD  citalopram (CELEXA) 20 MG tablet Take 20 mg by mouth daily.   Yes Historical Provider, MD  LORazepam (ATIVAN) 1 MG tablet Take 1 mg by mouth at bedtime.    Yes Historical Provider, MD  cephALEXin (KEFLEX) 250 MG/5ML suspension Take 10 mLs (500 mg total) by mouth 4 (four) times daily. Patient not taking: Reported on 11/06/2015 05/24/15   Linwood DibblesJon Knapp, MD  diphenhydrAMINE (BENADRYL) 12.5 MG/5ML  elixir Take 10 mLs (25 mg total) by mouth every 8 (eight) hours as needed for itching or allergies. Patient not taking: Reported on 11/06/2015 04/30/15   Nishant Dhungel, MD  nicotine (NICODERM CQ - DOSED IN MG/24 HOURS) 21 mg/24hr patch Place 1 patch (21 mg total) onto the skin daily. Patient not taking: Reported on 11/06/2015 04/30/15   Nishant Dhungel, MD  sucralfate (CARAFATE) 1 GM/10ML suspension Take 10 mLs by mouth 4 (four) times daily. 04/09/15 05/09/15  Historical Provider, MD   BP 114/65 mmHg  Pulse 89  Temp(Src) 99.2 F (37.3 C) (Oral)  Resp 24  Ht  (1.626 m)  Wt 139 lb (63.05 kg)  BMI 23.85 kg/m2  SpO2 96% Physical  Exam  Constitutional: She is oriented to person, place, and time. She appears well-developed and well-nourished. No distress.  HENT:  Head: Normocephalic and atraumatic.  Right Ear: Hearing normal.  Left Ear: Hearing normal.  Nose: Nose normal.  Mouth/Throat: Oropharynx is clear and moist and mucous membranes are normal.  Eyes: Conjunctivae and EOM are normal. Pupils are equal, round, and reactive to light.  Neck: Normal range of motion. Neck supple.  Cardiovascular: Regular rhythm, S1 normal and S2 normal.  Exam reveals no gallop and no friction rub.   No murmur heard. Pulmonary/Chest: Effort normal. No respiratory distress. She has wheezes. She exhibits no tenderness.  Decreased breath sounds with occasional basilar wheeze  Abdominal: Soft. Normal appearance and bowel sounds are normal. There is no hepatosplenomegaly. There is no tenderness. There is no rebound, no guarding, no tenderness at McBurney's point and negative Murphy's sign. No hernia.  Musculoskeletal: Normal range of motion.  Neurological: She is alert and oriented to person, place, and time. She has normal strength. No cranial nerve deficit or sensory deficit. Coordination normal. GCS eye subscore is 4. GCS verbal subscore is 5. GCS motor subscore is 6.  Skin: Skin is warm, dry and intact. No rash noted. No cyanosis.  Psychiatric: She has a normal mood and affect. Her speech is normal and behavior is normal. Thought content normal.  Nursing note and vitals reviewed.   ED Course  Procedures (including critical care time) DIAGNOSTIC STUDIES: Oxygen Saturation is 96% on RA, normal by my interpretation.    COORDINATION OF CARE: 1:21 AM-Discussed treatment plan which includes CXR with pt at bedside and pt agreed to plan.   Labs Review Labs Reviewed - No data to display  Imaging Review Dg Chest 2 View  11/07/2015  CLINICAL DATA:  Non productive cough and congestion. Shortness of breath. EXAM: CHEST  2 VIEW COMPARISON:   11/05/2015 FINDINGS: There is an azygos lobe. Heart size is within normal limits and stable. Increased densities along the medial right lung base. No large pleural effusions. No acute bone abnormality. IMPRESSION: Subtle densities along the medial right lung base. Differential include atelectasis versus small focus of infection. Electronically Signed   By: Richarda Overlie M.D.   On: 11/07/2015 00:29   I have personally reviewed and evaluated these images and lab results as part of my medical decision-making.   EKG Interpretation None      ED ECG REPORT   Date: 11/07/2015  Rate: 86  Rhythm: normal sinus rhythm  QRS Axis: normal  Intervals: borderline QT prolong  ST/T Wave abnormalities: normal  Conduction Disutrbances:none  Narrative Interpretation:   Old EKG Reviewed: none available  I have personally reviewed the EKG tracing and agree with the computerized printout as noted.   MDM  Final diagnoses:  None  Community acquired pneumonia  Patient presents to the ER for evaluation of shortness of breath. Patient reports that she was diagnosed by her primary doctor with pneumonia yesterday. She did have an x-ray at the office. She was started on Zithromax. She has taken the Zithromax but today is feeling much worse. She feels like she is having more trouble breathing and is wheezing. She denies any history of asthma and COPD, but is a smoker. She did have mild bronchospasm present at arrival. Chest x-ray showed possible small pneumonia at the right lung base.  Patient was given Rocephin complete treatment of community-acquired pneumonia. She was administered bronchodilator therapy. While observed here and receiving breathing treatments, however, patient seems to have worsened. Respiratory rate has progressively increased. Oxygen saturation is dropping and she is requiring supplemental oxygen. Patient was taken off supplemental oxygen and ambulated. After ambulating a short distance here her  oxygen saturation dropped to 80%. She will require hospitalization for further management.  I personally performed the services described in this documentation, which was scribed in my presence. The recorded information has been reviewed and is accurate.    Gilda Crease, MD 11/07/15 [phone]

## 2015-11-07 NOTE — Progress Notes (Signed)
Initial Nutrition Assessment  DOCUMENTATION CODES:   Non-severe (moderate) malnutrition in context of chronic illness  INTERVENTION:  -Glucerna Shake po TID, each supplement provides 220 kcal and 10 grams of protein -RD to continue to monitor  NUTRITION DIAGNOSIS:   Malnutrition related to chronic illness as evidenced by moderate depletions of muscle mass, moderate depletion of body fat, percent weight loss.  GOAL:   Patient will meet greater than or equal to 90% of their needs  MONITOR:   PO intake, I & O's, Labs, Weight trends, Supplement acceptance  REASON FOR ASSESSMENT:   Malnutrition Screening Tool    ASSESSMENT:   Katrina Cohen is a 50 y.o. woman with a history of HTN, DM, cirrhosis based on CT imaging (patient tells me that she has not had a biopsy; she has been referred to [facility]), bipolar disorder with depression, and EtOH and tobacco abuse who developed dry cough and sore throat on Monday. She saw her PCP on Wednesday and was initially diagnosed with bronchitis. She was given a prescription for azithromycin. She was sent for labs and imaging, and reports that she was notified of an abnormal chest xray, concerning for pneumonia  Spoke with Katrina Cohen and her son at bedside. She endorses good appetite but unintentional weight loss. Per chart, exhibits 32#/18.7% severe wt loss in 6 months. Patient has history of T2DM and Cirrhosis of liver. Was doing low carb diets at home, but states "they make me feel weak." Encouraged her to consume adequate carbs at each meal. Low carb diet combined with cirrhosis likely contributed to her weight loss. She also claims she stopped drinking.  PO intake at home seems ok. Patient is on disability and receives "$48 of food stamps per month." She doesn't eat a ton of meat, but does plenty of beans and vegetables. Normally does cereal, eggs, yogurt and a banana for breakfast.  She also does "pure protein" bars at home.  Seems like  she may be eating ok. Increased needs related to cirrhosis and her limiting intake are likely causes of weight loss.  Appetite has been great during stay. She was eating a tray during my visit. Had eggs, bacon, and grits for breakfast. Documented intake 100%  Nutrition-Focused physical exam completed. Findings are moderate fat depletion, mild-moderate muscle depletion, and no edema.   Labs and medications reviewed: CBGs 240-326; Na 133;  Diet Order:  Diet Carb Modified Fluid consistency:: Thin; Room service appropriate?: Yes  Skin:  Reviewed, no issues  Last BM:  PTA  Height:   Ht Readings from Last 1 Encounters:  11/06/15 5\' 4"  (1.626 m)    Weight:   Wt Readings from Last 1 Encounters:  11/06/15 139 lb (63.05 kg)    Ideal Body Weight:  54.54 kg  BMI:  Body mass index is 23.85 kg/(m^2).  Estimated Nutritional Needs:   Kcal:  1550-1900 calories  Protein:  60-75 grams  Fluid:  1.2 L  EDUCATION NEEDS:   No education needs identified at this time  Dionne AnoWilliam M. Keena Heesch, MS, RD LDN Inpatient Clinical Dietitian Pager [phone]

## 2015-11-07 NOTE — Progress Notes (Addendum)
[facility] f/u note  Mrs Quin HoopLum was admitted this AM by my colleague. Chart reviewed and patient examined.   Subjective: cough slightly improved, dyspnea much better.  Exam: lungs clear  Principal Problem:   CAP (community acquired pneumonia) - possible right medial lung base infiltrate - Rocephin + Azithromycin - taper Steroids - check room air O2 in AM  Lactic acidosis - likely due to resp distress which has resolved- no particularly hypotensive    Type 2 diabetes mellitus  - insulin sliding scale    Cirrhosis with jaundice and ascites  - Spironolactone  Anemia/  Thrombocytopenia/ elevated INR - due to cirrhosis  Calvert CantorSaima Sherilynn Dieu, MD

## 2015-11-08 DIAGNOSIS — D696 Thrombocytopenia, unspecified: Secondary | ICD-10-CM

## 2015-11-08 LAB — CBC
HEMATOCRIT: 26.7 % — AB (ref 36.0–46.0)
Hemoglobin: 9 g/dL — ABNORMAL LOW (ref 12.0–15.0)
MCH: 31.9 pg (ref 26.0–34.0)
MCHC: 33.7 g/dL (ref 30.0–36.0)
MCV: 94.7 fL (ref 78.0–100.0)
Platelets: 82 10*3/uL — ABNORMAL LOW (ref 150–400)
RBC: 2.82 MIL/uL — ABNORMAL LOW (ref 3.87–5.11)
RDW: 15 % (ref 11.5–15.5)
WBC: 4.4 10*3/uL (ref 4.0–10.5)

## 2015-11-08 LAB — COMPREHENSIVE METABOLIC PANEL
ALBUMIN: 3 g/dL — AB (ref 3.5–5.0)
ALK PHOS: 111 U/L (ref 38–126)
ALT: 18 U/L (ref 14–54)
AST: 27 U/L (ref 15–41)
Anion gap: 5 (ref 5–15)
BILIRUBIN TOTAL: 5.3 mg/dL — AB (ref 0.3–1.2)
BUN: 12 mg/dL (ref 6–20)
CALCIUM: 9 mg/dL (ref 8.9–10.3)
CO2: 27 mmol/L (ref 22–32)
CREATININE: 0.55 mg/dL (ref 0.44–1.00)
Chloride: 103 mmol/L (ref 101–111)
GFR calc Af Amer: >60 mL/min (ref >60)
GFR calc non Af Amer: >60 mL/min (ref >60)
GLUCOSE: 161 mg/dL — AB (ref 65–99)
Potassium: 4.4 mmol/L (ref 3.5–5.1)
Sodium: 135 mmol/L (ref 135–145)
TOTAL PROTEIN: 6.8 g/dL (ref 6.5–8.1)

## 2015-11-08 LAB — LEGIONELLA PNEUMOPHILA SEROGP 1 UR AG: L. pneumophila Serogp 1 Ur Ag: NEGATIVE

## 2015-11-08 LAB — GLUCOSE, CAPILLARY: GLUCOSE-CAPILLARY: 137 mg/dL — AB (ref 65–99)

## 2015-11-08 LAB — STREP PNEUMONIAE URINARY ANTIGEN: STREP PNEUMO URINARY ANTIGEN: NEGATIVE

## 2015-11-08 MED ORDER — BENZONATATE 100 MG PO CAPS
100.0000 mg | ORAL_CAPSULE | Freq: Three times a day (TID) | ORAL | Status: DC | PRN
Start: 2015-11-08 — End: 2016-01-31

## 2015-11-08 MED ORDER — CEFUROXIME AXETIL 500 MG PO TABS: 500.0000 mg | ORAL_TABLET | Freq: Two times a day (BID) | ORAL | Status: DC

## 2015-11-08 MED ORDER — PREDNISONE 20 MG PO TABS: ORAL_TABLET | ORAL | Status: DC

## 2015-11-08 NOTE — Discharge Summary (Addendum)
Physician Discharge Summary  Katrina EeJennifer Rodkey ZOX:[id number] DOB: 09/10/1965 DOA: 11/06/2015  PCP: Deneen HartsDD,ELIZABETH, FNP  Admit date: 11/06/2015 Discharge date: 11/12/2015  Time spent: 55 minutes  Recommendations for Outpatient Follow-up:  Resume Aldactone when oral intake improves  Discharge Condition: stable    Discharge Diagnoses:  Principal Problem:   CAP (community acquired pneumonia) Active Problems:   Type 2 diabetes mellitus (HCC)   Anemia   Cirrhosis (HCC)   Thrombocytopenia (HCC)   History of present illness:  Katrina Cohen is a 50 y.o. woman with a history of HTN, DM, cirrhosis based on CT imaging (patient tells me that she has not had a biopsy; [facility] bipolar disorder with depression, and EtOH and tobacco abuse who developed dry cough and sore throat on Monday. She saw her PCP on Wednesday and was initially diagnosed with bronchitis. She was given a prescription for azithromycin. She was sent for labs and imaging, and reports that she was notified of an abnormal chest xray, concerning for pneumonia. Reportedly, antibiotic was not changed, but she was advised to monitor her symptoms and report to the ED for any worsening in her condition. Today, she has had increased wheezing, progressive shortness of breath, low grade fever to 99 associated with chills and sweats. No nausea or vomiting. No abdominal pain. No lower urinary tract symptoms.  Hospital Course:  Principal Problem:  CAP (community acquired pneumonia) - possible right medial lung base infiltrate - Rocephin + Azithromycin which she has improved with- can continue Cefuroxime and finish the Zithromax she was previously taking - taper Steroids - pulse ox on room air O2 is normal on exertion  Lactic acidosis - likely due to resp distress which has resolved- no particularly hypotensive   Type 2 diabetes mellitus  - insulin sliding scale used in the hospital- diet controlled-  CBG is 137 today   Cirrhosis with jaundice and ascites  - Spironolactone on hold due to dehydration  Anemia/ Thrombocytopenia/ elevated INR - due to cirrhosis     Discharge Exam: Filed Weights   11/06/15 2237  Weight: 63.05 kg (139 lb)   Filed Vitals:   11/08/15 0523 11/08/15 0944  BP: 103/60   Pulse: 72 84  Temp: 97.6 F (36.4 C)   Resp: 19     General: AAO x 3, no distress Cardiovascular: RRR, no murmurs  Respiratory: clear to auscultation bilaterally GI: soft, non-tender, non-distended, bowel sound positive  Discharge Instructions You were cared for by a hospitalist during your hospital stay. If you have any questions about your discharge medications or the care you received while you were in the hospital after you are discharged, you can call the unit and asked to speak with the hospitalist on call if the hospitalist that took care of you is not available. Once you are discharged, your primary care physician will handle any further medical issues. Please note that NO REFILLS for any discharge medications will be authorized once you are discharged, as it is imperative that you return to your primary care physician (or establish a relationship with a primary care physician if you do not have one) for your aftercare needs so that they can reassess your need for medications and monitor your lab values.      Discharge Instructions    Discharge instructions    Complete by:  As directed   Diabetic diet     Increase activity slowly    Complete by:  As directed  Medication List    STOP taking these medications        spironolactone 100 MG tablet  Commonly known as:  ALDACTONE      TAKE these medications        albuterol 108 (90 Base) MCG/ACT inhaler  Commonly known as:  PROVENTIL HFA;VENTOLIN HFA  Inhale 2 puffs into the lungs every 4 (four) hours as needed for wheezing or shortness of breath.     ARIPiprazole 5 MG tablet  Commonly known as:   ABILIFY  Take 5 mg by mouth daily.     azithromycin 250 MG tablet  Commonly known as:  ZITHROMAX  Take 250-500 mg by mouth daily. Dose pack. Take two tablets on day one and then one tablet daily x4 days. Started 06/15 for 5 days     benzonatate 100 MG capsule  Commonly known as:  TESSALON  Take 1 capsule (100 mg total) by mouth 3 (three) times daily as needed for cough.     cefUROXime 500 MG tablet  Commonly known as:  CEFTIN  Take 1 tablet (500 mg total) by mouth 2 (two) times daily with a meal.     citalopram 20 MG tablet  Commonly known as:  CELEXA  Take 20 mg by mouth daily.     LORazepam 1 MG tablet  Commonly known as:  ATIVAN  Take 1 mg by mouth at bedtime.     predniSONE 20 MG tablet  Commonly known as:  DELTASONE  Take 30 mg tomorrow, 20 mg the next day and 10 mg the last day       No Known Allergies Follow-up Information    Follow up with [facility], FNP In 1 week.   Specialty:  Nurse Practitioner   Contact information:   [address] [place] [address] [phone]        The results of significant diagnostics from this hospitalization (including imaging, microbiology, ancillary and laboratory) are listed below for reference.    Significant Diagnostic Studies: Dg Chest 2 View  11/12/2015  CLINICAL DATA:  Cough and shortness breath, worsening. Mid chest pain. EXAM: CHEST  2 VIEW COMPARISON:  11/06/2015 FINDINGS: Prior right medial lung base density has essentially resolved. Azygos fissure noted. Cardiac and mediastinal margins appear normal. No pleural effusion. IMPRESSION: 1. Currently no pneumonia is identified. Electronically Signed   By: Gaylyn Rong M.D.   On: 11/12/2015 10:08   Dg Chest 2 View  11/07/2015  CLINICAL DATA:  Non productive cough and congestion. Shortness of breath. EXAM: CHEST  2 VIEW COMPARISON:  11/05/2015 FINDINGS: There is an azygos lobe. Heart size is within normal limits and stable. Increased densities along  the medial right lung base. No large pleural effusions. No acute bone abnormality. IMPRESSION: Subtle densities along the medial right lung base. Differential include atelectasis versus small focus of infection. Electronically Signed   By: Richarda Overlie M.D.   On: 11/07/2015 00:29    Microbiology: Recent Results (from the past 240 hour(s))  Culture, blood (routine x 2) Call MD if unable to obtain prior to antibiotics being given     Status: None (Preliminary result)   Collection Time: 11/07/15  7:41 AM  Result Value Ref Range Status   Specimen Description BLOOD LEFT ARM  Final   Special Requests BOTTLES DRAWN AEROBIC AND ANAEROBIC 6CC  Final   Culture   Final    NO GROWTH 4 DAYS Performed at [facility]    Report Status  PENDING  Incomplete  Culture, blood (routine x 2) Call MD if unable to obtain prior to antibiotics being given     Status: None (Preliminary result)   Collection Time: 11/07/15  7:41 AM  Result Value Ref Range Status   Specimen Description BLOOD LEFT ARM  Final   Special Requests BOTTLES DRAWN AEROBIC ONLY 7CC  Final   Culture   Final    NO GROWTH 4 DAYS Performed at [facility]    Report Status PENDING  Incomplete     Labs: Basic Metabolic Panel:  Recent Labs Lab 11/07/15 0138 11/08/15 0550  NA 133* 135  K 3.9 4.4  CL 101 103  CO2 25 27  GLUCOSE 185* 161*  BUN 7 12  CREATININE 0.71 0.55  CALCIUM 8.9 9.0   Liver Function Tests:  Recent Labs Lab 11/07/15 0138 11/08/15 0550  AST 33 27  ALT 17 18  ALKPHOS 133* 111  BILITOT 6.7* 5.3*  PROT 6.7 6.8  ALBUMIN 2.9* 3.0*   No results for input(s): LIPASE, AMYLASE in the last 168 hours. No results for input(s): AMMONIA in the last 168 hours. CBC:  Recent Labs Lab 11/07/15 0138 11/08/15 0550  WBC 4.6 4.4  NEUTROABS 3.2  --   HGB 9.2* 9.0*  HCT 26.9* 26.7*  MCV 92.1 94.7  PLT 95* 82*   Cardiac Enzymes: No results for input(s): CKTOTAL, CKMB, CKMBINDEX, TROPONINI in the last 168  hours. BNP: BNP (last 3 results) No results for input(s): BNP in the last 8760 hours.  ProBNP (last 3 results) No results for input(s): PROBNP in the last 8760 hours.  CBG:  Recent Labs Lab 11/07/15 1106 11/07/15 1156 11/07/15 1701 11/07/15 2149 11/08/15 0735  GLUCAP 338* 326* 308* 236* 137*       SignedCalvert Cantor, MD [facility] 11/12/2015, 12:29 PM

## 2015-11-08 NOTE — Progress Notes (Signed)
Patient discharged.  Leaving with prescriptions called to her pharmacy.  Accompanied by son.  Being picked up by her sister.  Room air.  Denies pain.  No n/v.  Respirations even and unlabored. Reports understanding of her discharge instructions.  No s/s of distress.  No complaints.

## 2015-11-11 LAB — HEMOGLOBIN A1C
HEMOGLOBIN A1C: 5.1 % (ref 4.8–5.6)
MEAN PLASMA GLUCOSE: 100 mg/dL

## 2015-11-12 ENCOUNTER — Observation Stay (HOSPITAL_COMMUNITY)
Admission: EM | Admit: 2015-11-12 | Discharge: 2015-11-13 | Disposition: A | Discharge status: Home / Self Care | Payer: Medicaid Other | Attending: Family Medicine | Admitting: Family Medicine

## 2015-11-12 ENCOUNTER — Emergency Department (HOSPITAL_COMMUNITY): Payer: Medicaid Other

## 2015-11-12 ENCOUNTER — Encounter (HOSPITAL_COMMUNITY): Payer: Self-pay | Admitting: Emergency Medicine

## 2015-11-12 ENCOUNTER — Other Ambulatory Visit: Payer: Self-pay

## 2015-11-12 DIAGNOSIS — R0602 Shortness of breath: Secondary | ICD-10-CM

## 2015-11-12 DIAGNOSIS — Z79899 Other long term (current) drug therapy: Secondary | ICD-10-CM | POA: Insufficient documentation

## 2015-11-12 DIAGNOSIS — K7460 Unspecified cirrhosis of liver: Secondary | ICD-10-CM | POA: Diagnosis not present

## 2015-11-12 DIAGNOSIS — K7031 Alcoholic cirrhosis of liver with ascites: Secondary | ICD-10-CM | POA: Diagnosis not present

## 2015-11-12 DIAGNOSIS — D638 Anemia in other chronic diseases classified elsewhere: Secondary | ICD-10-CM | POA: Insufficient documentation

## 2015-11-12 DIAGNOSIS — I10 Essential (primary) hypertension: Secondary | ICD-10-CM | POA: Insufficient documentation

## 2015-11-12 DIAGNOSIS — J441 Chronic obstructive pulmonary disease with (acute) exacerbation: Secondary | ICD-10-CM

## 2015-11-12 DIAGNOSIS — K7030 Alcoholic cirrhosis of liver without ascites: Secondary | ICD-10-CM | POA: Diagnosis not present

## 2015-11-12 DIAGNOSIS — E119 Type 2 diabetes mellitus without complications: Secondary | ICD-10-CM | POA: Insufficient documentation

## 2015-11-12 DIAGNOSIS — J9601 Acute respiratory failure with hypoxia: Principal | ICD-10-CM | POA: Insufficient documentation

## 2015-11-12 DIAGNOSIS — D732 Chronic congestive splenomegaly: Secondary | ICD-10-CM | POA: Diagnosis not present

## 2015-11-12 DIAGNOSIS — F17210 Nicotine dependence, cigarettes, uncomplicated: Secondary | ICD-10-CM | POA: Diagnosis not present

## 2015-11-12 DIAGNOSIS — F419 Anxiety disorder, unspecified: Secondary | ICD-10-CM | POA: Diagnosis not present

## 2015-11-12 DIAGNOSIS — R05 Cough: Secondary | ICD-10-CM | POA: Diagnosis present

## 2015-11-12 DIAGNOSIS — R0902 Hypoxemia: Secondary | ICD-10-CM

## 2015-11-12 DIAGNOSIS — F1010 Alcohol abuse, uncomplicated: Secondary | ICD-10-CM | POA: Diagnosis present

## 2015-11-12 DIAGNOSIS — D696 Thrombocytopenia, unspecified: Secondary | ICD-10-CM | POA: Diagnosis not present

## 2015-11-12 DIAGNOSIS — F329 Major depressive disorder, single episode, unspecified: Secondary | ICD-10-CM | POA: Diagnosis not present

## 2015-11-12 LAB — CBC WITH DIFFERENTIAL/PLATELET
BASOS PCT: 0 %
Basophils Absolute: 0 10*3/uL (ref 0.0–0.1)
EOS ABS: 0 10*3/uL (ref 0.0–0.7)
EOS PCT: 1 %
HCT: 29.4 % — ABNORMAL LOW (ref 36.0–46.0)
Hemoglobin: 9.9 g/dL — ABNORMAL LOW (ref 12.0–15.0)
Lymphocytes Relative: 17 %
Lymphs Abs: 0.7 10*3/uL (ref 0.7–4.0)
MCH: 31.8 pg (ref 26.0–34.0)
MCHC: 33.7 g/dL (ref 30.0–36.0)
MCV: 94.5 fL (ref 78.0–100.0)
MONO ABS: 0.1 10*3/uL (ref 0.1–1.0)
MONOS PCT: 3 %
NEUTROS ABS: 3.3 10*3/uL (ref 1.7–7.7)
Neutrophils Relative %: 79 %
PLATELETS: 98 10*3/uL — AB (ref 150–400)
RBC: 3.11 MIL/uL — ABNORMAL LOW (ref 3.87–5.11)
RDW: 15.3 % (ref 11.5–15.5)
WBC: 4.2 10*3/uL (ref 4.0–10.5)

## 2015-11-12 LAB — COMPREHENSIVE METABOLIC PANEL
ALBUMIN: 3 g/dL — AB (ref 3.5–5.0)
ALT: 31 U/L (ref 14–54)
ANION GAP: 7 (ref 5–15)
AST: 45 U/L — ABNORMAL HIGH (ref 15–41)
Alkaline Phosphatase: 118 U/L (ref 38–126)
BUN: 8 mg/dL (ref 6–20)
CO2: 24 mmol/L (ref 22–32)
Calcium: 8.5 mg/dL — ABNORMAL LOW (ref 8.9–10.3)
Chloride: 104 mmol/L (ref 101–111)
Creatinine, Ser: 0.51 mg/dL (ref 0.44–1.00)
GFR calc Af Amer: >60 mL/min (ref >60)
GFR calc non Af Amer: >60 mL/min (ref >60)
GLUCOSE: 174 mg/dL — AB (ref 65–99)
POTASSIUM: 4.3 mmol/L (ref 3.5–5.1)
SODIUM: 135 mmol/L (ref 135–145)
Total Bilirubin: 6.4 mg/dL — ABNORMAL HIGH (ref 0.3–1.2)
Total Protein: 6.6 g/dL (ref 6.5–8.1)

## 2015-11-12 LAB — I-STAT CHEM 8, ED
BUN: 8 mg/dL (ref 6–20)
CHLORIDE: 103 mmol/L (ref 101–111)
CREATININE: 0.5 mg/dL (ref 0.44–1.00)
Calcium, Ion: 1.12 mmol/L (ref 1.12–1.23)
GLUCOSE: 174 mg/dL — AB (ref 65–99)
HCT: 30 % — ABNORMAL LOW (ref 36.0–46.0)
Hemoglobin: 10.2 g/dL — ABNORMAL LOW (ref 12.0–15.0)
POTASSIUM: 4.7 mmol/L (ref 3.5–5.1)
Sodium: 138 mmol/L (ref 135–145)
TCO2: 27 mmol/L (ref 0–100)

## 2015-11-12 LAB — CULTURE, BLOOD (ROUTINE X 2)
CULTURE: NO GROWTH
Culture: NO GROWTH

## 2015-11-12 MED ORDER — SPIRONOLACTONE 100 MG PO TABS
100.0000 mg | ORAL_TABLET | Freq: Every day | ORAL | Status: DC
  Administered 2015-11-13: 100 mg via ORAL
  Filled 2015-11-12: qty 4
  Filled 2015-11-12: qty 1

## 2015-11-12 MED ORDER — ARIPIPRAZOLE 5 MG PO TABS
5.0000 mg | ORAL_TABLET | Freq: Every day | ORAL | Status: DC
  Administered 2015-11-12: 5 mg via ORAL
  Filled 2015-11-12 (×2): qty 1

## 2015-11-12 MED ORDER — IPRATROPIUM-ALBUTEROL 0.5-2.5 (3) MG/3ML IN SOLN
3.0000 mL | Freq: Once | RESPIRATORY_TRACT | Status: AC
  Administered 2015-11-12: 3 mL via RESPIRATORY_TRACT
  Filled 2015-11-12: qty 3

## 2015-11-12 MED ORDER — LORAZEPAM 1 MG PO TABS
1.0000 mg | ORAL_TABLET | Freq: Every day | ORAL | Status: DC
  Administered 2015-11-12: 1 mg via ORAL
  Filled 2015-11-12: qty 1

## 2015-11-12 MED ORDER — METHYLPREDNISOLONE SODIUM SUCC 40 MG IJ SOLR
40.0000 mg | Freq: Two times a day (BID) | INTRAMUSCULAR | Status: DC
  Administered 2015-11-12 – 2015-11-13 (×2): 40 mg via INTRAVENOUS
  Filled 2015-11-12 (×2): qty 1

## 2015-11-12 MED ORDER — CITALOPRAM HYDROBROMIDE 10 MG PO TABS
20.0000 mg | ORAL_TABLET | Freq: Every day | ORAL | Status: DC
  Administered 2015-11-12: 20 mg via ORAL
  Filled 2015-11-12 (×4): qty 2

## 2015-11-12 MED ORDER — IPRATROPIUM-ALBUTEROL 0.5-2.5 (3) MG/3ML IN SOLN
3.0000 mL | Freq: Four times a day (QID) | RESPIRATORY_TRACT | Status: DC
  Administered 2015-11-12: 3 mL via RESPIRATORY_TRACT
  Filled 2015-11-12: qty 3

## 2015-11-12 MED ORDER — ACETAMINOPHEN 650 MG RE SUPP: 650.0000 mg | Freq: Four times a day (QID) | RECTAL | Status: DC | PRN

## 2015-11-12 MED ORDER — PREDNISONE 20 MG PO TABS
60.0000 mg | ORAL_TABLET | Freq: Once | ORAL | Status: AC
  Administered 2015-11-12: 60 mg via ORAL
  Filled 2015-11-12: qty 3

## 2015-11-12 MED ORDER — IPRATROPIUM-ALBUTEROL 0.5-2.5 (3) MG/3ML IN SOLN
3.0000 mL | Freq: Four times a day (QID) | RESPIRATORY_TRACT | Status: DC
Start: 2015-11-13 — End: 2015-11-13
  Administered 2015-11-13: 3 mL via RESPIRATORY_TRACT
  Filled 2015-11-12: qty 3

## 2015-11-12 MED ORDER — ACETAMINOPHEN 325 MG PO TABS: 650.0000 mg | ORAL_TABLET | Freq: Four times a day (QID) | ORAL | Status: DC | PRN

## 2015-11-12 MED ORDER — ALBUTEROL SULFATE (2.5 MG/3ML) 0.083% IN NEBU: 2.5000 mg | INHALATION_SOLUTION | RESPIRATORY_TRACT | Status: DC | PRN

## 2015-11-12 MED ORDER — GUAIFENESIN-DM 100-10 MG/5ML PO SYRP
5.0000 mL | ORAL_SOLUTION | Freq: Once | ORAL | Status: AC
  Administered 2015-11-12: 5 mL via ORAL
  Filled 2015-11-12: qty 10

## 2015-11-12 MED ORDER — SODIUM CHLORIDE 0.9 % IV SOLN
INTRAVENOUS | Status: DC
  Administered 2015-11-12: 1000 mL via INTRAVENOUS

## 2015-11-12 NOTE — H&P (Signed)
History and Physical  Katrina Cohen ZOX:[id number] DOB: [birth year] DOA: 11/12/2015  PCP: Deneen Harts, FNP  Patient coming from: home  Chief Complaint: short of breath  HPI:  50yow smoker, just discharged 6/17, treated for pneumonia initially with Rocephin and Zithromax, discharged on cefuroxime and Zithromax and steroid taper who presented with increasing SOB. Workup unrevealing except acute hypoxia and presumed COPD exacerbation.  Reports compliance with discharge medications. Felt well for first 2 days after discharge but over last 24 hours became increasingly SOB. Inhaler helped her to get through the night. Dry nonproductive cough. Still smoking but working on cutting back. No chest pain.  ED Course: Afebrile, tachypnea, reportedly hypoxic . Treated with bronchodilator, prednisone. Pertinent labs: Basic metabolic panel unremarkable, AST 45, total bilirubin 6.4, platelet count 98, hemoglobin 9.9 EKG: Independently reviewed. Sinus rhythm, no acute changes Imaging: Chest x-ray no acute disease. CT of the chest no acute abnormalities noted.  Review of Systems:  Negative for fever,sore throat, rash, new muscle aches, chest pain,dysuria, bleeding, n/v/abdominal pain.  Just saw eye doctor, needs new glasses  Past Medical History  Diagnosis Date  . Bipolar disorder (HCC) 1980s    with depression, anxiety  . Diabetes mellitus   . Hypertension   . Depression with anxiety 1980s    [facility] admission 05/2011.    Marland Kitchen Dysphagia 11/2014    normal esophagram 11/2014. EGD per Dr Dulce Sellar.   . Jaundice 04/2015    In setting of infectious mononucleosis. Ultrasound in [place]: hepatosplenomegaly, portal htn, possible cirrhosis  . Mononucleosis 04/15/2015  . Cirrhosis [facility])     Past Surgical History  Procedure Laterality Date  . Cesarean section    . Appendectomy    . Colonoscopy    . Esophagogastroduodenoscopy      Dr Dulce Sellar ? 2016.      reports that she has been smoking Cigarettes.  She has  been smoking about 1.00 pack per day. She does not have any smokeless tobacco history on file. She reports that she drinks alcohol. She reports that she does not use illicit drugs. Ambulatory status: independent  No Known Allergies  Family History  Problem Relation Age of Onset  . Diabetes Mother   . Cancer Father   . Diabetes Sister   . Diabetes Brother      Prior to Admission medications   Medication Sig Start Date End Date Taking? Authorizing Provider  albuterol (PROVENTIL HFA;VENTOLIN HFA) 108 (90 Base) MCG/ACT inhaler Inhale 2 puffs into the lungs every 4 (four) hours as needed for wheezing or shortness of breath.  09/02/15  Yes Historical Provider, MD  ARIPiprazole (ABILIFY) 5 MG tablet Take 5 mg by mouth daily. 04/02/15  Yes Historical Provider, MD  azithromycin (ZITHROMAX) 250 MG tablet Take 250-500 mg by mouth daily. Dose pack. Take two tablets on day one and then one tablet daily x4 days. Started 06/15 for 5 days   Yes Historical Provider, MD  benzonatate (TESSALON) 100 MG capsule Take 1 capsule (100 mg total) by mouth 3 (three) times daily as needed for cough. 11/08/15  Yes Calvert Cantor, MD  cefUROXime (CEFTIN) 500 MG tablet Take 1 tablet (500 mg total) by mouth 2 (two) times daily with a meal. Patient taking differently: Take 500 mg by mouth 2 (two) times daily with a meal. Started 06/20 for 10 days 11/09/15  Yes Calvert Cantor, MD  citalopram (CELEXA) 20 MG tablet Take 20 mg by mouth daily.   Yes Historical Provider, MD  LORazepam (ATIVAN) 1 MG  tablet Take 1 mg by mouth at bedtime.    Yes Historical Provider, MD  predniSONE (DELTASONE) 20 MG tablet Take 30 mg tomorrow, 20 mg the next day and 10 mg the last day 11/08/15  Yes Calvert Cantor, MD  spironolactone (ALDACTONE) 50 MG tablet Take 100 mg by mouth daily.   Yes Historical Provider, MD    Physical Exam: Filed Vitals:   11/12/15 1007 11/12/15 1136 11/12/15 1200 11/12/15 1408  BP:  109/65 102/60 99/54  Pulse: 84 87 86 82    Temp:      TempSrc:      Resp: 33 30 24 18   SpO2: 99% 91% 94% 95%    Constitutional:  . Appears calm and comfortable Eyes:  . PERRL and irises appear normal . Normal conjunctivae and lids ENMT:  . external ears, nose appear normal . grossly normal hearing Neck:  . neck appears normal, no masses, normal ROM . no thyromegaly Respiratory:  . No wheezes or rales. Some rhonchi R>L . Respiratory effort normal. Speaks in full sentences. No retractions or accessory muscle use Cardiovascular:  . RRR, no m/r/g . No LE extremity edema   Abdomen:  . Abdomen no tenderness Musculoskeletal:  . RUE, LUE, RLE, LLE   o strength and tone normal Skin:  . No rashes, lesions, ulcers noted . palpation of skin: no induration or nodules Neurologic:  . Grossly normal Psychiatric:  . judgement and insight appear normal . Mental status o Mood, affect appropriate  Wt Readings from Last 3 Encounters:  11/06/15 63.05 kg (139 lb)  06/17/15 70.308 kg (155 lb)  05/24/15 77.565 kg (171 lb)    I have personally reviewed following labs and imaging studies  Labs on Admission:  CBC:  Recent Labs Lab 11/07/15 0138 11/08/15 0550 11/12/15 1537 11/12/15 1557  WBC 4.6 4.4 4.2  --   NEUTROABS 3.2  --  3.3  --   HGB 9.2* 9.0* 9.9* 10.2*  HCT 26.9* 26.7* 29.4* 30.0*  MCV 92.1 94.7 94.5  --   PLT 95* 82* 98*  --    Basic Metabolic Panel:  Recent Labs Lab 11/07/15 0138 11/08/15 0550 11/12/15 1537 11/12/15 1557  NA 133* 135 135 138  K 3.9 4.4 4.3 4.7  CL 101 103 104 103  CO2 25 27 24   --   GLUCOSE 185* 161* 174* 174*  BUN 7 12 8 8   CREATININE 0.71 0.55 0.51 0.50  CALCIUM 8.9 9.0 8.5*  --    Liver Function Tests:  Recent Labs Lab 11/07/15 0138 11/08/15 0550 11/12/15 1537  AST 33 27 45*  ALT 17 18 31   ALKPHOS 133* 111 118  BILITOT 6.7* 5.3* 6.4*  PROT 6.7 6.8 6.6  ALBUMIN 2.9* 3.0* 3.0*   Coagulation Profile:  Recent Labs Lab 11/07/15 0741  INR 1.90*   CBG:  Recent  Labs Lab 11/07/15 1106 11/07/15 1156 11/07/15 1701 11/07/15 2149 11/08/15 0735  GLUCAP 338* 326* 308* 236* 137*   Urine analysis:    Component Value Date/Time   COLORURINE YELLOW 05/24/2015 0833   APPEARANCEUR CLOUDY* 05/24/2015 0833   LABSPEC 1.000* 05/24/2015 0833   PHURINE 6.5 05/24/2015 0833   GLUCOSEU NEGATIVE 05/24/2015 0833   HGBUR SMALL* 05/24/2015 0833   BILIRUBINUR NEGATIVE 05/24/2015 0833   KETONESUR NEGATIVE 05/24/2015 0833   PROTEINUR NEGATIVE 05/24/2015 0833   UROBILINOGEN 1.0 06/07/2011 1303   NITRITE NEGATIVE 05/24/2015 0833   LEUKOCYTESUR LARGE* 05/24/2015 0833   Sepsis Labs: @LABRCNTIP (procalcitonin:4,lacticidven:4) ) Recent Results (from  the past 240 hour(s))  Culture, blood (routine x 2) Call MD if unable to obtain prior to antibiotics being given     Status: None   Collection Time: 11/07/15  7:41 AM  Result Value Ref Range Status   Specimen Description BLOOD LEFT ARM  Final   Special Requests BOTTLES DRAWN AEROBIC AND ANAEROBIC 6CC  Final   Culture   Final    NO GROWTH 5 DAYS [facility]    Report Status 11/12/2015 FINAL  Final  Culture, blood (routine x 2) Call MD if unable to obtain prior to antibiotics being given     Status: None   Collection Time: 11/07/15  7:41 AM  Result Value Ref Range Status   Specimen Description BLOOD LEFT ARM  Final   Special Requests BOTTLES DRAWN AEROBIC ONLY [facility]7CC  Final   Culture   Final    NO GROWTH 5 DAYS [facility]    Report Status 11/12/2015 FINAL  Final      Radiological Exams on Admission: Dg Chest 2 View  11/12/2015  CLINICAL DATA:  Cough and shortness breath, worsening. Mid chest pain. EXAM: CHEST  2 VIEW COMPARISON:  11/06/2015 FINDINGS: Prior right medial lung base density has essentially resolved. Azygos fissure noted. Cardiac and mediastinal margins appear normal. No pleural effusion. IMPRESSION: 1. Currently no pneumonia is identified. Electronically Signed    By: Gaylyn RongWalter  Liebkemann M.D.   On: 11/12/2015 10:08   Ct Chest Wo Contrast  11/12/2015  CLINICAL DATA:  Persistent wheezing. History of pneumonia. Cough, shortness of breath worsening. EXAM: CT CHEST WITHOUT CONTRAST TECHNIQUE: Multidetector CT imaging of the chest was performed following the standard protocol without IV contrast. COMPARISON:  Chest x-ray 11/12/2015.  Chest CT 02/08/2013. FINDINGS: Mediastinum/Nodes: Heart is normal size. Aorta is normal caliber. No mediastinal, hilar, or axillary adenopathy. Lungs/Pleura: Minimal right base atelectasis. There are scattered ground-glass densities within both lungs, predominantly in subpleural locations. This may reflect areas of atelectasis. No pleural effusion on the left. Upper abdomen: Suggestion of subtle nodularity to the liver surface suggesting early cirrhosis. Possible splenomegaly although the spleen is not imaged in its entirety. Small amount of free fluid adjacent to the liver. Musculoskeletal: Chest wall soft tissues are unremarkable. No acute bony abnormality. IMPRESSION: Small right pleural effusion. Scattered ground-glass areas peripherally in the lungs bilaterally. This could reflect areas of atelectasis or early alveolitis. Suggestion of possible early cirrhosis changes in the visualized liver. Small amount of perihepatic ascites. Questionable splenomegaly. The spleen is not imaged in its entirety. Electronically Signed   By: Charlett NoseKevin  Dover M.D.   On: 11/12/2015 13:37    EKG: Independently reviewed. As above  Principal Problem:   Acute respiratory failure with hypoxia (HCC) Active Problems:   Alcohol abuse   Cirrhosis (HCC)   Thrombocytopenia (HCC)   SOB (shortness of breath)   COPD exacerbation (HCC)   Assessment/Plan 1. Acute hypoxic respiratory failure secondary to (presumed) COPD exacerbation. 2. COPD (presumed) exacerbation 3. Recent treatment for pneumonia. CT chest negative for infiltrate. Afebrile, no  leukocytosis. 4. Tobacco use disorder, recommend cessation. 5. Diabetes mellitus, AG 7, glucose 174 6. Depression, anxiety, bipolar disorder 7. Cirrhosis with splenomegaly, thrombocytopenia, hyperbilirubinemia  8. Anemia of chronic disease. Stable.    Appears stable, admit to med bed for obs  IV steroids, nebs, oxygen supplementation. No abx.  SSI  Encourage smoking cession  DVT prophylaxis:SCDs Code Status: full code Family Communication: son at bedside Disposition Plan: obs  med bed    Time spent: 50 minutes  Brendia Sacks, MD  [facility] Direct contact: [phone] --Via amion app OR  --[email or web address]; password [email or web address]  7PM-7AM contact night coverage as above  11/12/2015, 4:25 PM

## 2015-11-12 NOTE — ED Provider Notes (Signed)
CSN: [id number]     Arrival date & time 11/12/15  1478 History   First MD Initiated Contact with Patient 11/12/15 [phone]     Chief Complaint  Patient presents with  . Cough  . Shortness of Breath     (Consider location/radiation/quality/duration/timing/severity/associated sxs/prior Treatment) Patient is a 50 y.o. female presenting with cough and shortness of breath. The history is provided by the patient.  Cough Associated symptoms: shortness of breath and wheezing   Associated symptoms: no chest pain, no fever, no headaches and no rash   Shortness of Breath Associated symptoms: cough and wheezing   Associated symptoms: no abdominal pain, no chest pain, no fever, no headaches and no rash   Patient presenting with complaint of shortness of breath cough patient is concerned that her pneumonia has reoccurred. She was admitted June 15 for community-acquired pneumonia. Discharged home on Saturday. Patient is a heavy smoker. Patient does not have home oxygen. Patient here on room air  sets oxygen saturations  below 90%. Patient denies fevers. Patient uses albuterol at home still on antibiotics currently taking Ceftin and just finished Zithromax today. Patient finished a course of steroids yesterday. Patient has albuterol inhalers at home.  Past Medical History  Diagnosis Date  . Bipolar disorder (HCC) 1980s    with depression, anxiety  . Diabetes mellitus   . Hypertension   . Depression with anxiety 1980s    [facility] admission 05/2011.    Marland Kitchen Dysphagia 11/2014    normal esophagram 11/2014. EGD per Dr Dulce Sellar.   . Jaundice 04/2015    In setting of infectious mononucleosis. Ultrasound in [place]: hepatosplenomegaly, portal htn, possible cirrhosis  . Mononucleosis 04/15/2015  . Cirrhosis [facility])    Past Surgical History  Procedure Laterality Date  . Cesarean section    . Appendectomy    . Colonoscopy    . Esophagogastroduodenoscopy      Dr Dulce Sellar ? 2016.    Family History  Problem Relation Age  of Onset  . Diabetes Mother   . Cancer Father   . Diabetes Sister   . Diabetes Brother    Social History  Substance Use Topics  . Smoking status: Current Every Day Smoker -- 1.00 packs/day    Types: Cigarettes  . Smokeless tobacco: None  . Alcohol Use: Yes     Comment: occasionally   OB History    No data available     Review of Systems  Constitutional: Negative for fever.  HENT: Negative for congestion.   Eyes: Negative for visual disturbance.  Respiratory: Positive for cough, shortness of breath and wheezing.   Cardiovascular: Negative for chest pain.  Gastrointestinal: Negative for abdominal pain.  Genitourinary: Negative for dysuria.  Musculoskeletal: Negative for back pain.  Skin: Negative for rash.  Neurological: Negative for headaches.  Hematological: Does not bruise/bleed easily.  Psychiatric/Behavioral: Negative for confusion.      Allergies  Review of patient's allergies indicates no known allergies.  Home Medications   Prior to Admission medications   Medication Sig Start Date End Date Taking? Authorizing Provider  albuterol (PROVENTIL HFA;VENTOLIN HFA) 108 (90 Base) MCG/ACT inhaler Inhale 2 puffs into the lungs every 4 (four) hours as needed for wheezing or shortness of breath.  09/02/15  Yes Historical Provider, MD  ARIPiprazole (ABILIFY) 5 MG tablet Take 5 mg by mouth daily. 04/02/15  Yes Historical Provider, MD  azithromycin (ZITHROMAX) 250 MG tablet Take 250-500 mg by mouth daily. Dose pack. Take two tablets on day one and  then one tablet daily x4 days. Started 06/15 for 5 days   Yes Historical Provider, MD  benzonatate (TESSALON) 100 MG capsule Take 1 capsule (100 mg total) by mouth 3 (three) times daily as needed for cough. 11/08/15  Yes Calvert Cantor, MD  cefUROXime (CEFTIN) 500 MG tablet Take 1 tablet (500 mg total) by mouth 2 (two) times daily with a meal. Patient taking differently: Take 500 mg by mouth 2 (two) times daily with a meal. Started 06/20  for 10 days 11/09/15  Yes Calvert Cantor, MD  citalopram (CELEXA) 20 MG tablet Take 20 mg by mouth daily.   Yes Historical Provider, MD  LORazepam (ATIVAN) 1 MG tablet Take 1 mg by mouth at bedtime.    Yes Historical Provider, MD  predniSONE (DELTASONE) 20 MG tablet Take 30 mg tomorrow, 20 mg the next day and 10 mg the last day 11/08/15  Yes Calvert Cantor, MD  spironolactone (ALDACTONE) 50 MG tablet Take 100 mg by mouth daily.   Yes Historical Provider, MD   BP 99/54 mmHg  Pulse 82  Temp(Src) 98.3 F (36.8 C) (Oral)  Resp 18  SpO2 95% Physical Exam  Constitutional: She is oriented to person, place, and time. She appears well-developed and well-nourished. No distress.  HENT:  Head: Normocephalic and atraumatic.  Eyes: Conjunctivae and EOM are normal. Pupils are equal, round, and reactive to light.  Neck: Normal range of motion. Neck supple.  Cardiovascular: Normal rate, regular rhythm and normal heart sounds.   Pulmonary/Chest: Effort normal. She has wheezes.  Abdominal: Soft. Bowel sounds are normal. There is no tenderness.  Musculoskeletal: Normal range of motion. She exhibits no edema.  Neurological: She is alert and oriented to person, place, and time. No cranial nerve deficit. She exhibits normal muscle tone. Coordination normal.  Skin: Skin is warm. No rash noted.  Nursing note and vitals reviewed.   ED Course  Procedures (including critical care time) Labs Review Labs Reviewed  CBC WITH DIFFERENTIAL/PLATELET - Abnormal; Notable for the following:    RBC 3.11 (*)    Hemoglobin 9.9 (*)    HCT 29.4 (*)    Platelets 98 (*)    All other components within normal limits  I-STAT CHEM 8, ED - Abnormal; Notable for the following:    Glucose, Bld 174 (*)    Hemoglobin 10.2 (*)    HCT 30.0 (*)    All other components within normal limits  COMPREHENSIVE METABOLIC PANEL   Results for orders placed or performed during the hospital encounter of 11/12/15  CBC with Differential/Platelet   Result Value Ref Range   WBC 4.2 4.0 - 10.5 K/uL   RBC 3.11 (L) 3.87 - 5.11 MIL/uL   Hemoglobin 9.9 (L) 12.0 - 15.0 g/dL   HCT 16.1 (L) 09.6 - 04.5 %   MCV 94.5 78.0 - 100.0 fL   MCH 31.8 26.0 - 34.0 pg   MCHC 33.7 30.0 - 36.0 g/dL   RDW 40.9 81.1 - 91.4 %   Platelets 98 (L) 150 - 400 K/uL   Neutrophils Relative % 79 %   Neutro Abs 3.3 1.7 - 7.7 K/uL   Lymphocytes Relative 17 %   Lymphs Abs 0.7 0.7 - 4.0 K/uL   Monocytes Relative 3 %   Monocytes Absolute 0.1 0.1 - 1.0 K/uL   Eosinophils Relative 1 %   Eosinophils Absolute 0.0 0.0 - 0.7 K/uL   Basophils Relative 0 %   Basophils Absolute 0.0 0.0 - 0.1 K/uL  I-Stat  Chem 8, ED  Result Value Ref Range   Sodium 138 135 - 145 mmol/L   Potassium 4.7 3.5 - 5.1 mmol/L   Chloride 103 101 - 111 mmol/L   BUN 8 6 - 20 mg/dL   Creatinine, Ser 1.610.50 0.44 - 1.00 mg/dL   Glucose, Bld 096174 (H) 65 - 99 mg/dL   Calcium, Ion 0.451.12 4.091.12 - 1.23 mmol/L   TCO2 27 0 - 100 mmol/L   Hemoglobin 10.2 (L) 12.0 - 15.0 g/dL   HCT 81.130.0 (L) 91.436.0 - 78.246.0 %     Imaging Review Dg Chest 2 View  11/12/2015  CLINICAL DATA:  Cough and shortness breath, worsening. Mid chest pain. EXAM: CHEST  2 VIEW COMPARISON:  11/06/2015 FINDINGS: Prior right medial lung base density has essentially resolved. Azygos fissure noted. Cardiac and mediastinal margins appear normal. No pleural effusion. IMPRESSION: 1. Currently no pneumonia is identified. Electronically Signed   By: Gaylyn RongWalter  Liebkemann M.D.   On: 11/12/2015 10:08   Ct Chest Wo Contrast  11/12/2015  CLINICAL DATA:  Persistent wheezing. History of pneumonia. Cough, shortness of breath worsening. EXAM: CT CHEST WITHOUT CONTRAST TECHNIQUE: Multidetector CT imaging of the chest was performed following the standard protocol without IV contrast. COMPARISON:  Chest x-ray 11/12/2015.  Chest CT 02/08/2013. FINDINGS: Mediastinum/Nodes: Heart is normal size. Aorta is normal caliber. No mediastinal, hilar, or axillary adenopathy.  Lungs/Pleura: Minimal right base atelectasis. There are scattered ground-glass densities within both lungs, predominantly in subpleural locations. This may reflect areas of atelectasis. No pleural effusion on the left. Upper abdomen: Suggestion of subtle nodularity to the liver surface suggesting early cirrhosis. Possible splenomegaly although the spleen is not imaged in its entirety. Small amount of free fluid adjacent to the liver. Musculoskeletal: Chest wall soft tissues are unremarkable. No acute bony abnormality. IMPRESSION: Small right pleural effusion. Scattered ground-glass areas peripherally in the lungs bilaterally. This could reflect areas of atelectasis or early alveolitis. Suggestion of possible early cirrhosis changes in the visualized liver. Small amount of perihepatic ascites. Questionable splenomegaly. The spleen is not imaged in its entirety. Electronically Signed   By: Charlett NoseKevin  Dover M.D.   On: 11/12/2015 13:37   I have personally reviewed and evaluated these images and lab results as part of my medical decision-making.   EKG Interpretation   Date/Time:  Wednesday November 12 2015 09:45:10 EDT Ventricular Rate:  79 PR Interval:    QRS Duration: 82 QT Interval:  400 QTC Calculation: 459 R Axis:   71 Text Interpretation:  Sinus rhythm Confirmed by Tanishia Lemaster  MD, Kaylyn Garrow  ([id number]) on 11/12/2015 9:48:07 AM      MDM   Final diagnoses:  SOB (shortness of breath)  Hypoxia   Patient presenting with shortness of breath and oxygen requirement. On room air oxygen sats go down into the 80s. Patient was concerned about recurrent pneumonia. The patient was admitted on June 15 for community-acquired pneumonia [facility]. Patient discharged discharged home on Saturday. Patient just finished a course of Zithromax and is still on Septra 10. Patient has albuterol inhalers at home. And patient just completed course of steroids just today.  Patient here with bilateral rhonchi and wheezing.  Patient received albuterol Atrovent nebulizers 2 as well as 60 mg of prednisone by mouth.  Patient wheezing persisted along with rhonchi. Chest x-ray was negative for pneumonia. CT chest was done mostly to rule out pneumonia. And that was negative. Clinically no immediate concern for pulmonary embolus since patient has bilateral wheezing.  Suspect that since patient is a heavy smoker that this is a emphysema COPD type presentation. Patient will require readmission. Patient's labs without any significant abnormalities. No leukocytosis no fevers. We'll discuss with hospitalist for admission.  Patient does not have home oxygen.  Vanetta Mulders, MD 11/12/15 [phone]

## 2015-11-12 NOTE — ED Notes (Addendum)
Pt recently admitted for PNA and was discharged on 6/15. Pt reports her symptoms of cough and sob have gotten worse. Pt has been smoking at home. Also reports pain with coughing

## 2015-11-13 DIAGNOSIS — J9601 Acute respiratory failure with hypoxia: Secondary | ICD-10-CM | POA: Diagnosis not present

## 2015-11-13 DIAGNOSIS — D696 Thrombocytopenia, unspecified: Secondary | ICD-10-CM | POA: Diagnosis not present

## 2015-11-13 DIAGNOSIS — K7030 Alcoholic cirrhosis of liver without ascites: Secondary | ICD-10-CM

## 2015-11-13 DIAGNOSIS — J441 Chronic obstructive pulmonary disease with (acute) exacerbation: Secondary | ICD-10-CM | POA: Diagnosis not present

## 2015-11-13 MED ORDER — PREDNISONE 20 MG PO TABS: 40.0000 mg | ORAL_TABLET | Freq: Every day | ORAL | Status: DC

## 2015-11-13 MED ORDER — ALBUTEROL SULFATE (2.5 MG/3ML) 0.083% IN NEBU: 2.5000 mg | INHALATION_SOLUTION | RESPIRATORY_TRACT | Status: AC | PRN

## 2015-11-13 MED ORDER — PREDNISONE 10 MG PO TABS: ORAL_TABLET | ORAL | Status: DC

## 2015-11-13 MED ORDER — IPRATROPIUM-ALBUTEROL 0.5-2.5 (3) MG/3ML IN SOLN: 3.0000 mL | Freq: Three times a day (TID) | RESPIRATORY_TRACT | Status: DC

## 2015-11-13 NOTE — Progress Notes (Signed)
Went over all discharge information with patient and family.  All questions answered.  Nebulizer brought to patients room.  Preview AVS given to patient.  Pt stated she had a follow up appointment with PCP tomorrow.

## 2015-11-13 NOTE — Discharge Summary (Signed)
Physician Discharge Summary  Jory EeJennifer Bruntz ZOX:[id number] DOB: 1965-12-08 DOA: 11/12/2015  PCP: Deneen HartsDD,ELIZABETH, FNP  GI: Dr. Norma Fredricksonoledo.  Admit date: 11/12/2015 Discharge date: 11/13/2015  Recommendations for Outpatient Follow-up:  1. Resolution of COPD exacerbation  2. Consider formal testing for COPD 3. Continue to encourage smoking cessation 4. Ongoing care for cirrhosis 5. Anemia of chronic disease   Follow-up Information    [facility]. Schedule an appointment as soon as possible for a visit in 1 week.   Specialty:  Nurse Practitioner   Contact information:   [address] [place] [id number] [phone]      Discharge Diagnoses:  1. Acute hypoxic respiratory failure 2. COPD exacerbation 3. Tobacco use disorder 4. Cirrhosis with splenomegaly, thrombocytopenia, hyperbilirubinemia 5. Anemia of chronic disease  Discharge Condition: improved Disposition: home  Diet recommendation: low salt  Filed Weights   11/12/15 1800  Weight: 64.6 kg (142 lb 6.7 oz)    History of present illness:  50yow smoker, just discharged 6/17, treated for pneumonia initially with Rocephin and Zithromax, discharged on cefuroxime and Zithromax and steroid taper who presented with increasing SOB. Workup unrevealing except acute hypoxia and presumed COPD exacerbation.  Hospital Course:  Patient was admitted overnight, treated with IV steroids, bronchodilators, oxygen, supportive care. Condition rapidly improved with resolution of symptoms.  CT chest negative for pneumonia. Ambulating in hallway without hypoxia. Plan discharge home on steroid taper, continue albuterol as needed. Will arrange for home nebulizer.  1. Acute hypoxic respiratory failure secondary to presumed COPD exacerbation 2. COPD exacerbation (presumed) 3. Recent treatment for pneumonia. CT chest no evidence of infiltrate. Afebrile, no leukocytosis. 4. Tobacco use disorder. Patient working on  quitting. 5. Cirrhosis with splenomegaly, thrombocytopenia, hyperbilirubinemia, stable compared to previous laboratory values. No evidence of acute decompensation. Has primary gastroenterologist which she follows closely with. 6. Anemia of chronic disease, stable  Consultants:  None  Procedures:  None  Antimicrobials:  None  Discharge Instructions  Discharge Instructions    Activity as tolerated - No restrictions    Complete by:  As directed      DME Nebulizer machine    Complete by:  As directed      Diet - low sodium heart healthy    Complete by:  As directed      Discharge instructions    Complete by:  As directed   Call your physician or seek immediate medical attention for shortness of breath, fever, wheezing or worsening of condition.          Current Discharge Medication List    START taking these medications   Details  albuterol (PROVENTIL) (2.5 MG/3ML) 0.083% nebulizer solution Take 3 mLs (2.5 mg total) by nebulization every 4 (four) hours as needed for wheezing or shortness of breath. Qty: 75 mL, Refills: 1      CONTINUE these medications which have CHANGED   Details  predniSONE (DELTASONE) 10 MG tablet Take daily by mouth: 40 mg x3 days, then 20 mg x3 days, then 10 mg x3 days, then stop. Qty: 21 tablet, Refills: 0      CONTINUE these medications which have NOT CHANGED   Details  albuterol (PROVENTIL HFA;VENTOLIN HFA) 108 (90 Base) MCG/ACT inhaler Inhale 2 puffs into the lungs every 4 (four) hours as needed for wheezing or shortness of breath.     ARIPiprazole (ABILIFY) 5 MG tablet Take 5 mg by mouth daily. Refills: 0    benzonatate (TESSALON) 100 MG capsule Take  1 capsule (100 mg total) by mouth 3 (three) times daily as needed for cough. Qty: 20 capsule, Refills: 0    citalopram (CELEXA) 20 MG tablet Take 20 mg by mouth daily.    LORazepam (ATIVAN) 1 MG tablet Take 1 mg by mouth at bedtime.     spironolactone (ALDACTONE) 50 MG tablet Take 100 mg  by mouth daily.      STOP taking these medications     azithromycin (ZITHROMAX) 250 MG tablet      cefUROXime (CEFTIN) 500 MG tablet        No Known Allergies  The results of significant diagnostics from this hospitalization (including imaging, microbiology, ancillary and laboratory) are listed below for reference.    Significant Diagnostic Studies: Dg Chest 2 View  11/12/2015  CLINICAL DATA:  Cough and shortness breath, worsening. Mid chest pain. EXAM: CHEST  2 VIEW COMPARISON:  11/06/2015 FINDINGS: Prior right medial lung base density has essentially resolved. Azygos fissure noted. Cardiac and mediastinal margins appear normal. No pleural effusion. IMPRESSION: 1. Currently no pneumonia is identified. Electronically Signed   By: Gaylyn RongWalter  Liebkemann M.D.   On: 11/12/2015 10:08   Dg Chest 2 View  11/07/2015  CLINICAL DATA:  Non productive cough and congestion. Shortness of breath. EXAM: CHEST  2 VIEW COMPARISON:  11/05/2015 FINDINGS: There is an azygos lobe. Heart size is within normal limits and stable. Increased densities along the medial right lung base. No large pleural effusions. No acute bone abnormality. IMPRESSION: Subtle densities along the medial right lung base. Differential include atelectasis versus small focus of infection. Electronically Signed   By: Richarda OverlieAdam  Henn M.D.   On: 11/07/2015 00:29   Ct Chest Wo Contrast  11/12/2015  CLINICAL DATA:  Persistent wheezing. History of pneumonia. Cough, shortness of breath worsening. EXAM: CT CHEST WITHOUT CONTRAST TECHNIQUE: Multidetector CT imaging of the chest was performed following the standard protocol without IV contrast. COMPARISON:  Chest x-ray 11/12/2015.  Chest CT 02/08/2013. FINDINGS: Mediastinum/Nodes: Heart is normal size. Aorta is normal caliber. No mediastinal, hilar, or axillary adenopathy. Lungs/Pleura: Minimal right base atelectasis. There are scattered ground-glass densities within both lungs, predominantly in subpleural  locations. This may reflect areas of atelectasis. No pleural effusion on the left. Upper abdomen: Suggestion of subtle nodularity to the liver surface suggesting early cirrhosis. Possible splenomegaly although the spleen is not imaged in its entirety. Small amount of free fluid adjacent to the liver. Musculoskeletal: Chest wall soft tissues are unremarkable. No acute bony abnormality. IMPRESSION: Small right pleural effusion. Scattered ground-glass areas peripherally in the lungs bilaterally. This could reflect areas of atelectasis or early alveolitis. Suggestion of possible early cirrhosis changes in the visualized liver. Small amount of perihepatic ascites. Questionable splenomegaly. The spleen is not imaged in its entirety. Electronically Signed   By: Charlett NoseKevin  Dover M.D.   On: 11/12/2015 13:37    Microbiology: Recent Results (from the past 240 hour(s))  Culture, blood (routine x 2) Call MD if unable to obtain prior to antibiotics being given     Status: None   Collection Time: 11/07/15  7:41 AM  Result Value Ref Range Status   Specimen Description BLOOD LEFT ARM  Final   Special Requests BOTTLES DRAWN AEROBIC AND ANAEROBIC 6CC  Final   Culture   Final    NO GROWTH 5 DAYS Per[facility]    Report Status 11/12/2015 FINAL  Final  Culture, blood (routine x 2) Call MD if unable to obtain prior to  antibiotics being given     Status: None   Collection Time: 11/07/15  7:41 AM  Result Value Ref Range Status   Specimen Description BLOOD LEFT ARM  Final   Special Requests BOTTLES DRAWN AEROBIC ONLY 7CC  Final   Culture   Final    NO GROWTH 5 DAYS Performed at [facility]    Report Status 11/12/2015 FINAL  Final     Labs: Basic Metabolic Panel:  Recent Labs Lab 11/07/15 0138 11/08/15 0550 11/12/15 1537 11/12/15 1557  NA 133* 135 135 138  K 3.9 4.4 4.3 4.7  CL 101 103 104 103  CO2 --   GLUCOSE 185* 161* 174* 174*  BUN CREATININE 0.71 0.55  0.51 0.50  CALCIUM 8.9 9.0 8.5*  --    Liver Function Tests:  Recent Labs Lab 11/07/15 0138 11/08/15 0550 11/12/15 1537  AST 33 27 45*  ALT ALKPHOS 133* 111 118  BILITOT 6.7* 5.3* 6.4*  PROT 6.7 6.8 6.6  ALBUMIN 2.9* 3.0* 3.0*   CBC:  Recent Labs Lab 11/07/15 0138 11/08/15 0550 11/12/15 1537 11/12/15 1557  WBC 4.6 4.4 4.2  --   NEUTROABS 3.2  --  3.3  --   HGB 9.2* 9.0* 9.9* 10.2*  HCT 26.9* 26.7* 29.4* 30.0*  MCV 92.1 94.7 94.5  --   PLT 95* 82* 98*  --     CBG:  Recent Labs Lab 11/07/15 1106 11/07/15 1156 11/07/15 1701 11/07/15 2149 11/08/15 0735  GLUCAP 338* 326* 308* 236* 137*    Principal Problem:   Acute respiratory failure with hypoxia (HCC) Active Problems:   Alcohol abuse   Cirrhosis (HCC)   Thrombocytopenia (HCC)   SOB (shortness of breath)   COPD exacerbation (HCC)   Time coordinating discharge: 20 minutes  Signed:  Brendia Sacks, MD [facility] 11/13/2015, 11:23 AM

## 2015-11-13 NOTE — Progress Notes (Signed)
While ambulating on RA in the hall patient o2 saturations sustained in the high 90's.  Pt tolerated walk well.

## 2015-11-13 NOTE — Progress Notes (Signed)
  PROGRESS NOTE  Katrina Cohen ZOX:[id number] DOB: March 24, 1966 DOA: 11/12/2015 PCP: Deneen HartsDD,ELIZABETH, FNP  Brief Narrative: 50yow smoker, just discharged 6/17, treated for pneumonia initially with Rocephin and Zithromax, discharged on cefuroxime and Zithromax and steroid taper who presented with increasing SOB. Workup unrevealing except acute hypoxia and presumed COPD exacerbation.  Assessment/Plan: 1. Acute hypoxic respiratory failure secondary to presumed COPD exacerbation 2. COPD exacerbation (presumed) 3. Recent treatment for pneumonia. CT chest no evidence of infiltrate. Afebrile, no leukocytosis. 4. Tobacco use disorder. Patient working on quitting. 5. Cirrhosis with splenomegaly, thrombocytopenia, hyperbilirubinemia, stable compared to previous laboratory values. No evidence of acute decompensation. Has primary gastroenterologist which she follows closely with. 6. Anemia of chronic disease, stable   Much improved. No hypoxia. Ambulating without difficulty.  Plan discharge home on steroid taper. She recently has been on antibiotics and there is no evidence of pneumonia at this time.  Nebulizer machine will be ordered  Consider outpatient evaluation for formal testing for COPD with acute illness resolved.  Continue to encourage smoking cessation  Brendia Sacksaniel Mikal Wisman, MD  [facility] Direct contact: [phone] --Via amion app OR  --[email or web address]; password [email or web address]  7PM-7AM contact night coverage as above 11/13/2015, 11:07 AM  LOS: 1 day   Consultants:  None   Procedures:  None   Antimicrobials:  None   HPI/Subjective: Feels better, no SOB, breathing well, did well with ambulation.   Objective: Filed Vitals:   11/12/15 2055 11/13/15 0536 11/13/15 0817 11/13/15 0833  BP: 109/63 111/72    Pulse: 79 72    Temp: 97.7 F (36.5 C) 97.8 F (36.6 C)    TempSrc: Oral Oral    Resp: 20 20    Height:      Weight:      SpO2: 98% 100% 100% 95%   No intake or output data  in the 24 hours ending 11/13/15 1107   Filed Weights   11/12/15 1800  Weight: 64.6 kg (142 lb 6.7 oz)    Exam:    Constitutional:  . Appears calm and comfortable Respiratory:  . CTA bilaterally, no w/r/r anteriorly, few rhonchi posterior . Respiratory effort normal. No retractions or accessory muscle use Cardiovascular:  . RRR, no m/r/g . No LE extremity edema   Psychiatric:  . judgement and insight appear normal . Mental status o Mood, affect appropriate  I have personally reviewed following labs and imaging studies:  No new data  Scheduled Meds: . ARIPiprazole  5 mg Oral Daily  . citalopram  20 mg Oral Daily  . ipratropium-albuterol  3 mL Nebulization TID  . LORazepam  1 mg Oral QHS  . [START ON 11/14/2015] predniSONE  40 mg Oral Q breakfast  . spironolactone  100 mg Oral Daily   Continuous Infusions:   Principal Problem:   Acute respiratory failure with hypoxia (HCC) Active Problems:   Alcohol abuse   Cirrhosis (HCC)   Thrombocytopenia (HCC)   SOB (shortness of breath)   COPD exacerbation (HCC)   LOS: 1 day

## 2015-11-13 NOTE — Progress Notes (Signed)
PT Cancellation Note  Patient Details Name: Jory EeJennifer Cianci MRN: [id number] DOB: [birth year]   Cancelled Treatment:    Reason Eval/Treat Not Completed: PT screened, no needs identified, will sign off. Spoke with RN-no PT needs. Pt walked with nursing without difficulty. Will sign off. Thanks.    Rebeca AlertJannie Hawraa Stambaugh, MPT Pager: [phone]

## 2015-11-13 NOTE — Progress Notes (Signed)
Pt do not qualify for Home O2.

## 2016-01-17 ENCOUNTER — Emergency Department (HOSPITAL_COMMUNITY)
Admission: EM | Admit: 2016-01-17 | Discharge: 2016-01-17 | Disposition: A | Discharge status: Home / Self Care | Payer: Medicaid Other | Attending: Emergency Medicine | Admitting: Emergency Medicine

## 2016-01-17 ENCOUNTER — Encounter (HOSPITAL_COMMUNITY): Payer: Self-pay | Admitting: Emergency Medicine

## 2016-01-17 DIAGNOSIS — E119 Type 2 diabetes mellitus without complications: Secondary | ICD-10-CM | POA: Diagnosis not present

## 2016-01-17 DIAGNOSIS — F17210 Nicotine dependence, cigarettes, uncomplicated: Secondary | ICD-10-CM | POA: Insufficient documentation

## 2016-01-17 DIAGNOSIS — Z79899 Other long term (current) drug therapy: Secondary | ICD-10-CM | POA: Insufficient documentation

## 2016-01-17 DIAGNOSIS — R531 Weakness: Secondary | ICD-10-CM | POA: Insufficient documentation

## 2016-01-17 DIAGNOSIS — R55 Syncope and collapse: Secondary | ICD-10-CM | POA: Diagnosis present

## 2016-01-17 DIAGNOSIS — R52 Pain, unspecified: Secondary | ICD-10-CM | POA: Diagnosis not present

## 2016-01-17 DIAGNOSIS — H1589 Other disorders of sclera: Secondary | ICD-10-CM | POA: Diagnosis not present

## 2016-01-17 DIAGNOSIS — I10 Essential (primary) hypertension: Secondary | ICD-10-CM | POA: Diagnosis not present

## 2016-01-17 DIAGNOSIS — R4781 Slurred speech: Secondary | ICD-10-CM | POA: Insufficient documentation

## 2016-01-17 DIAGNOSIS — J441 Chronic obstructive pulmonary disease with (acute) exacerbation: Secondary | ICD-10-CM | POA: Insufficient documentation

## 2016-01-17 NOTE — ED Notes (Signed)
Pt was on the phone "half asleep" answering questions with family. Pt speech appeared to be slurred Family assumed pt may be having stroke. Pt concerned with conversation with family immediatly informed son and nephew how she was feeling. Son and NavarreNephew witnessed clear speech and pt was at baseline. EMS called for evaluation. EMS evaluated pt for stroke-cleared on scene. NEGATIVE Pt states has increased pain. Given tramadol for pain with no relief. Suppose to have referrals for pain management and Hospice. Pt states currently pain is at baseline and pt is ready to go home. Resident is at bedside and evaluating pt.

## 2016-01-17 NOTE — ED Triage Notes (Signed)
Per EMS, Pt from home c/o generalized pain everywhere x 2 weeks. Pt prescribed tramadol for it but doesn't feel like it's helping. A&Ox4. Ambulatory.

## 2016-01-17 NOTE — ED Notes (Signed)
MD at bedside. 

## 2016-01-17 NOTE — ED Provider Notes (Signed)
[facility]-EMERGENCY DEPT Provider Note   CSN: [id number] Arrival date & time: 01/17/16  1432     History   Chief Complaint Chief Complaint  Patient presents with  . Generalized Body Aches    Pain    HPI Katrina Cohen is a 50 y.o. female.  A 50 year old female who presented due to decreased level of consciousness. Patient has some slurred speech on the phone but that quickly resolved. EMS evaluated the patient and was negative for strep screen. Patient has a history of cirrhosis due to alcohol abuse. She also has history of chronic pain being treated with tramadol. He denies any focal deficits. [facility]. No recent fever, vomiting. No black or bloody stools. Complains of diffuse whole-body pain which is been chronic in nature times several weeks. Patient states that she now feels better and wants to go home and does not want any further evaluation here.      Past Medical History:  Diagnosis Date  . Bipolar disorder (HCC) 1980s   with depression, anxiety  . Cirrhosis (HCC)   . Depression with anxiety 1980s   [facility]    . Diabetes mellitus   . Dysphagia 11/2014   normal esophagram 11/2014. EGD per Dr Dulce Sellarutlaw.   . Hypertension   . Jaundice 04/2015   In setting of infectious mononucleosis. Ultrasound in [place]: hepatosplenomegaly, portal htn, possible cirrhosis  . Mononucleosis 04/15/2015    Patient Active Problem List   Diagnosis Date Noted  . SOB (shortness of breath) 11/12/2015  . COPD exacerbation (HCC) 11/12/2015  . Acute respiratory failure with hypoxia (HCC) 11/12/2015  . CAP (community acquired pneumonia) 11/07/2015  . Cirrhosis (HCC) 11/07/2015  . Thrombocytopenia (HCC) 11/07/2015  . Alcohol abuse 04/30/2015  . Acute hepatitis 04/30/2015  . Tobacco abuse 04/30/2015  . Malnutrition of moderate degree 04/29/2015  . Type 2 diabetes mellitus (HCC) 04/29/2015  . Hypokalemia 04/29/2015  . Anemia 04/29/2015  . Hyponatremia  04/29/2015  . Coagulopathy (HCC) 04/29/2015  . Hypoalbuminemia 04/29/2015  . Mononucleosis 04/28/2015  . Vitamin D deficiency 06/11/2011  . Major depression (HCC) 06/10/2011    Past Surgical History:  Procedure Laterality Date  . APPENDECTOMY    . CESAREAN SECTION    . COLONOSCOPY    . ESOPHAGOGASTRODUODENOSCOPY     Dr Dulce Sellarutlaw ? 2016.     OB History    No data available       Home Medications    Prior to Admission medications   Medication Sig Start Date End Date Taking? Authorizing Provider  albuterol (PROVENTIL HFA;VENTOLIN HFA) 108 (90 Base) MCG/ACT inhaler Inhale 2 puffs into the lungs every 4 (four) hours as needed for wheezing or shortness of breath.  09/02/15   Historical Provider, MD  albuterol (PROVENTIL) (2.5 MG/3ML) 0.083% nebulizer solution Take 3 mLs (2.5 mg total) by nebulization every 4 (four) hours as needed for wheezing or shortness of breath. 11/13/15   Standley Brookinganiel P Goodrich, MD  ARIPiprazole (ABILIFY) 5 MG tablet Take 5 mg by mouth daily. 04/02/15   Historical Provider, MD  benzonatate (TESSALON) 100 MG capsule Take 1 capsule (100 mg total) by mouth 3 (three) times daily as needed for cough. 11/08/15   Calvert CantorSaima Rizwan, MD  citalopram (CELEXA) 20 MG tablet Take 20 mg by mouth daily.    Historical Provider, MD  LORazepam (ATIVAN) 1 MG tablet Take 1 mg by mouth at bedtime.     Historical Provider, MD  predniSONE (DELTASONE) 10 MG tablet  Take daily by mouth: 40 mg x3 days, then 20 mg x3 days, then 10 mg x3 days, then stop. 11/13/15   Standley Brooking, MD  spironolactone (ALDACTONE) 50 MG tablet Take 100 mg by mouth daily.    Historical Provider, MD    Family History Family History  Problem Relation Age of Onset  . Diabetes Mother   . Cancer Father   . Diabetes Sister   . Diabetes Brother     Social History Social History  Substance Use Topics  . Smoking status: Current Every Day Smoker    Packs/day: 1.00    Types: Cigarettes  . Smokeless tobacco: Not on file  .  Alcohol use Yes     Comment: occasionally     Allergies   Review of patient's allergies indicates no known allergies.   Review of Systems Review of Systems  All other systems reviewed and are negative.    Physical Exam Updated Vital Signs BP 110/60   Pulse 75   Temp 99 F (37.2 C)   Resp 16   SpO2 100%   Physical Exam  Constitutional: She is oriented to person, place, and time. She appears well-developed and well-nourished.  Non-toxic appearance. No distress.  HENT:  Head: Normocephalic and atraumatic.  Eyes: Conjunctivae, EOM and lids are normal. Pupils are equal, round, and reactive to light. Scleral icterus is present.  Neck: Normal range of motion. Neck supple. No tracheal deviation present. No thyroid mass present.  Cardiovascular: Normal rate, regular rhythm and normal heart sounds.  Exam reveals no gallop.   No murmur heard. Pulmonary/Chest: Effort normal and breath sounds normal. No stridor. No respiratory distress. She has no decreased breath sounds. She has no wheezes. She has no rhonchi. She has no rales.  Abdominal: Soft. Normal appearance and bowel sounds are normal. She exhibits no distension. There is no tenderness. There is no rebound and no CVA tenderness.  Musculoskeletal: Normal range of motion. She exhibits no edema or tenderness.  Neurological: She is alert and oriented to person, place, and time. She has normal strength. She displays no tremor. No cranial nerve deficit or sensory deficit. GCS eye subscore is 4. GCS verbal subscore is 5. GCS motor subscore is 6.  Skin: Skin is warm and dry. No abrasion and no rash noted.  Psychiatric: She has a normal mood and affect. Her speech is normal and behavior is normal.  Nursing note and vitals reviewed.    ED Treatments / Results  Labs (all labs ordered are listed, but only abnormal results are displayed) Labs Reviewed - No data to display  EKG  EKG Interpretation None       Radiology No results  found.  Procedures Procedures (including critical care time)  Medications Ordered in ED Medications - No data to display   Initial Impression / Assessment and Plan / ED Course  I have reviewed the triage vital signs and the nursing notes.  Pertinent labs & imaging results that were available during my care of the patient were reviewed by me and considered in my medical decision making (see chart for details).  Clinical Course    Patient offered blood work and evaluation here which she is deferred. She is alert and oriented 4. Patient to follow-up with her Dr.  Final Clinical Impressions(s) / ED Diagnoses   Final diagnoses:  None    New Prescriptions New Prescriptions   No medications on file     Lorre Nick, MD 01/17/16 1711

## 2016-01-17 NOTE — ED Notes (Signed)
PT STATES SHE IS NOT STAYING. SHE WANTS TO GO HOME. RESIDENT AWARE OF PT'S REQUEST.

## 2016-01-29 ENCOUNTER — Other Ambulatory Visit (HOSPITAL_COMMUNITY): Payer: Self-pay | Admitting: Internal Medicine

## 2016-01-29 DIAGNOSIS — K7460 Unspecified cirrhosis of liver: Secondary | ICD-10-CM

## 2016-01-30 ENCOUNTER — Encounter (HOSPITAL_COMMUNITY): Payer: Self-pay | Admitting: *Deleted

## 2016-01-30 ENCOUNTER — Emergency Department (HOSPITAL_COMMUNITY)
Admission: EM | Admit: 2016-01-30 | Discharge: 2016-01-31 | Disposition: A | Discharge status: Home / Self Care | Payer: Medicaid Other | Attending: Emergency Medicine | Admitting: Emergency Medicine

## 2016-01-30 DIAGNOSIS — F17210 Nicotine dependence, cigarettes, uncomplicated: Secondary | ICD-10-CM | POA: Insufficient documentation

## 2016-01-30 DIAGNOSIS — I10 Essential (primary) hypertension: Secondary | ICD-10-CM | POA: Insufficient documentation

## 2016-01-30 DIAGNOSIS — Z79899 Other long term (current) drug therapy: Secondary | ICD-10-CM | POA: Insufficient documentation

## 2016-01-30 DIAGNOSIS — E119 Type 2 diabetes mellitus without complications: Secondary | ICD-10-CM | POA: Insufficient documentation

## 2016-01-30 DIAGNOSIS — R109 Unspecified abdominal pain: Secondary | ICD-10-CM

## 2016-01-30 DIAGNOSIS — K529 Noninfective gastroenteritis and colitis, unspecified: Secondary | ICD-10-CM

## 2016-01-30 DIAGNOSIS — N764 Abscess of vulva: Secondary | ICD-10-CM | POA: Insufficient documentation

## 2016-01-30 DIAGNOSIS — J449 Chronic obstructive pulmonary disease, unspecified: Secondary | ICD-10-CM | POA: Insufficient documentation

## 2016-01-30 DIAGNOSIS — R1084 Generalized abdominal pain: Secondary | ICD-10-CM | POA: Diagnosis present

## 2016-01-30 LAB — COMPREHENSIVE METABOLIC PANEL
ALK PHOS: 170 U/L — AB (ref 38–126)
ALT: 14 U/L (ref 14–54)
ANION GAP: 9 (ref 5–15)
AST: 30 U/L (ref 15–41)
Albumin: 2.6 g/dL — ABNORMAL LOW (ref 3.5–5.0)
BILIRUBIN TOTAL: 9.1 mg/dL — AB (ref 0.3–1.2)
BUN: <5 mg/dL — ABNORMAL LOW (ref 6–20)
CALCIUM: 8.6 mg/dL — AB (ref 8.9–10.3)
CO2: 23 mmol/L (ref 22–32)
Chloride: 99 mmol/L — ABNORMAL LOW (ref 101–111)
Creatinine, Ser: 0.72 mg/dL (ref 0.44–1.00)
GFR calc non Af Amer: >60 mL/min (ref >60)
Glucose, Bld: 239 mg/dL — ABNORMAL HIGH (ref 65–99)
Potassium: 3.9 mmol/L (ref 3.5–5.1)
Sodium: 131 mmol/L — ABNORMAL LOW (ref 135–145)
TOTAL PROTEIN: 5.7 g/dL — AB (ref 6.5–8.1)

## 2016-01-30 LAB — URINE MICROSCOPIC-ADD ON

## 2016-01-30 LAB — CBC WITH DIFFERENTIAL/PLATELET
Basophils Absolute: 0 10*3/uL (ref 0.0–0.1)
Basophils Relative: 0 %
EOS ABS: 0.1 10*3/uL (ref 0.0–0.7)
EOS PCT: 1 %
HCT: 27.8 % — ABNORMAL LOW (ref 36.0–46.0)
Hemoglobin: 8.9 g/dL — ABNORMAL LOW (ref 12.0–15.0)
LYMPHS ABS: 1 10*3/uL (ref 0.7–4.0)
Lymphocytes Relative: 21 %
MCH: 28 pg (ref 26.0–34.0)
MCHC: 32 g/dL (ref 30.0–36.0)
MCV: 87.4 fL (ref 78.0–100.0)
Monocytes Absolute: 0.5 10*3/uL (ref 0.1–1.0)
Monocytes Relative: 10 %
Neutro Abs: 3.3 10*3/uL (ref 1.7–7.7)
Neutrophils Relative %: 68 %
PLATELETS: 73 10*3/uL — AB (ref 150–400)
RBC: 3.18 MIL/uL — AB (ref 3.87–5.11)
RDW: 17.8 % — AB (ref 11.5–15.5)
WBC: 4.9 10*3/uL (ref 4.0–10.5)

## 2016-01-30 LAB — URINALYSIS, ROUTINE W REFLEX MICROSCOPIC
Glucose, UA: 250 mg/dL — AB
Hgb urine dipstick: NEGATIVE
KETONES UR: NEGATIVE mg/dL
NITRITE: NEGATIVE
PROTEIN: NEGATIVE mg/dL
Specific Gravity, Urine: 1.011 (ref 1.005–1.030)
pH: 6.5 (ref 5.0–8.0)

## 2016-01-30 LAB — I-STAT CG4 LACTIC ACID, ED: Lactic Acid, Venous: 2.39 mmol/L (ref 0.5–1.9)

## 2016-01-30 MED ORDER — HYDROMORPHONE HCL 1 MG/ML IJ SOLN
1.0000 mg | Freq: Once | INTRAMUSCULAR | Status: AC
  Administered 2016-01-30: 1 mg via INTRAVENOUS
  Filled 2016-01-30: qty 1

## 2016-01-30 MED ORDER — LIDOCAINE-EPINEPHRINE (PF) 2 %-1:200000 IJ SOLN
20.0000 mL | Freq: Once | INTRAMUSCULAR | Status: AC
  Administered 2016-01-30: 20 mL
  Filled 2016-01-30: qty 20

## 2016-01-30 NOTE — ED Notes (Signed)
2.39 lactic acid per mini lab, pt to be moved back next.

## 2016-01-30 NOTE — ED Triage Notes (Signed)
The npt is c/o abd pain for 2-3 days she has cirrhosis of the liver  And she feels like she has fluid build-up  And she has had a temp

## 2016-01-30 NOTE — ED Notes (Signed)
Patient ambulatory to restroom and back with steady gait.  

## 2016-01-30 NOTE — ED Triage Notes (Signed)
The pt has a boil on her labia and she thinks the temp is coming from that

## 2016-01-30 NOTE — ED Provider Notes (Signed)
[facility]-EMERGENCY DEPT Provider Note   CSN: [id number] Arrival date & time: 01/30/16  2104  By signing my name below, I, Katrina Cohen, attest that this documentation has been prepared under the direction and in the presence of Katrina Bilis, MD.  Electronically Signed: Octavia Cohen, ED Scribe. 01/30/16. 11:17 PM.    History   Chief Complaint Chief Complaint  Patient presents with  . Abdominal Pain    The history is provided by the patient. No language interpreter was used.   HPI Comments: Katrina Cohen is a 50 y.o. female who has a PMhx of bipolar disorder, depression, DM, hypokalemia, cirrhosis, hyponatremia presents to the Emergency Department complaining of constant, gradual worsening, moderate, generalized abdominal pain onset two days ago. She reports associated chills, bilateral leg swelling and leg pain. She notes having an ascites. Pt says she was seen by her PCP yesterday and was told she may have about 7 liters of fluid in her abdomen. She notes having two paracentesis in the past but has never has this kind of pain.  She denies loss of appetite, nausea, vomiting, diarrhea, urinary frequency, dysuria, hematuria.   Pt further complains of a boil that she believes may be a cyst on her right labia onset a "few" days ago. She notes having an associated fever (100.5) earlier today and believes that her fever may be from the boil. Pt called the nurse at her PCP and was told to come to the ED if her fever increased.   Gastroenterologist: Dr. Norma Cohen PCP: Katrina Cohen  Past Medical History:  Diagnosis Date  . Bipolar disorder (HCC) 1980s   with depression, anxiety  . Cirrhosis (HCC)   . Depression with anxiety 1980s   [facility] admission 05/2011.    . Diabetes mellitus   . Dysphagia 11/2014   normal esophagram 11/2014. EGD per Dr Katrina Cohen.   . Hypertension   . Jaundice 04/2015   In setting of infectious mononucleosis. Ultrasound in [place]: hepatosplenomegaly, portal htn,  possible cirrhosis  . Mononucleosis 04/15/2015    Patient Active Problem List   Diagnosis Date Noted  . SOB (shortness of breath) 11/12/2015  . COPD exacerbation (HCC) 11/12/2015  . Acute respiratory failure with hypoxia (HCC) 11/12/2015  . CAP (community acquired pneumonia) 11/07/2015  . Cirrhosis (HCC) 11/07/2015  . Thrombocytopenia (HCC) 11/07/2015  . Alcohol abuse 04/30/2015  . Acute hepatitis 04/30/2015  . Tobacco abuse 04/30/2015  . Malnutrition of moderate degree 04/29/2015  . Type 2 diabetes mellitus (HCC) 04/29/2015  . Hypokalemia 04/29/2015  . Anemia 04/29/2015  . Hyponatremia 04/29/2015  . Coagulopathy (HCC) 04/29/2015  . Hypoalbuminemia 04/29/2015  . Mononucleosis 04/28/2015  . Vitamin D deficiency 06/11/2011  . Major depression (HCC) 06/10/2011    Past Surgical History:  Procedure Laterality Date  . APPENDECTOMY    . CESAREAN SECTION    . COLONOSCOPY    . ESOPHAGOGASTRODUODENOSCOPY     Dr Katrina Cohen ? 2016.     OB History    No data available       Home Medications    Prior to Admission medications   Medication Sig Start Date End Date Taking? Authorizing Provider  albuterol (PROVENTIL HFA;VENTOLIN HFA) 108 (90 Base) MCG/ACT inhaler Inhale 2 puffs into the lungs every 4 (four) hours as needed for wheezing or shortness of breath.  09/02/15   Historical Provider, MD  albuterol (PROVENTIL) (2.5 MG/3ML) 0.083% nebulizer solution Take 3 mLs (2.5 mg total) by nebulization every 4 (four) hours as needed for wheezing or  shortness of breath. 11/13/15   Standley Brookinganiel P Goodrich, MD  ARIPiprazole (ABILIFY) 5 MG tablet Take 5 mg by mouth daily. 04/02/15   Historical Provider, MD  benzonatate (TESSALON) 100 MG capsule Take 1 capsule (100 mg total) by mouth 3 (three) times daily as needed for cough. 11/08/15   Calvert CantorSaima Rizwan, MD  citalopram (CELEXA) 20 MG tablet Take 20 mg by mouth daily.    Historical Provider, MD  LORazepam (ATIVAN) 1 MG tablet Take 1 mg by mouth at bedtime.      Historical Provider, MD  predniSONE (DELTASONE) 10 MG tablet Take daily by mouth: 40 mg x3 days, then 20 mg x3 days, then 10 mg x3 days, then stop. 11/13/15   Standley Brookinganiel P Goodrich, MD  spironolactone (ALDACTONE) 50 MG tablet Take 100 mg by mouth daily.    Historical Provider, MD    Family History Family History  Problem Relation Age of Onset  . Diabetes Mother   . Cancer Father   . Diabetes Sister   . Diabetes Brother     Social History Social History  Substance Use Topics  . Smoking status: Current Every Day Smoker    Packs/day: 1.00    Types: Cigarettes  . Smokeless tobacco: Never Used  . Alcohol use Yes     Comment: occasionally     Allergies   Review of patient's allergies indicates no known allergies.   Review of Systems Review of Systems  A complete 10 system review of systems was obtained and all systems are negative except as noted in the HPI and PMH.   Physical Exam Updated Vital Signs BP 109/66   Pulse 81   Temp 98.5 F (36.9 C) (Oral)   Ht 5\' 4"  (1.626 m)   Wt 157 lb (71.2 kg)   SpO2 98%   BMI 26.95 kg/m   Physical Exam  Constitutional: She is oriented to person, place, and time. She appears well-developed and well-nourished. No distress.  HENT:  Head: Normocephalic and atraumatic.  Eyes: EOM are normal.  Neck: Normal range of motion.  Cardiovascular: Normal rate, regular rhythm and normal heart sounds.   Pulmonary/Chest: Effort normal and breath sounds normal.  Abdominal: Soft. She exhibits no distension. There is tenderness.  Diffuse generalize tenderness without peritoneal signs  Genitourinary:  Genitourinary Comments: Fluctuant right labial abscess without drainage, small surrounding erythema present, no crepitus  Musculoskeletal: Normal range of motion.  Neurological: She is alert and oriented to person, place, and time.  Skin: Skin is warm and dry.  Psychiatric: She has a normal mood and affect. Judgment normal.  Nursing note and vitals  reviewed.    ED Treatments / Results  DIAGNOSTIC STUDIES: Oxygen Saturation is 98% on RA, normal by my interpretation.  COORDINATION OF CARE:  11:13 PM Discussed treatment plan with pt at bedside and pt agreed to plan.  Labs (all labs ordered are listed, but only abnormal results are displayed) Labs Reviewed  COMPREHENSIVE METABOLIC PANEL - Abnormal; Notable for the following:       Result Value   Sodium 131 (*)    Chloride 99 (*)    Glucose, Bld 239 (*)    BUN <5 (*)    Calcium 8.6 (*)    Total Protein 5.7 (*)    Albumin 2.6 (*)    Alkaline Phosphatase 170 (*)    Total Bilirubin 9.1 (*)    All other components within normal limits  URINALYSIS, ROUTINE W REFLEX MICROSCOPIC (NOT AT [facility]) - Abnormal; Notable  for the following:    Color, Urine AMBER (*)    Glucose, UA 250 (*)    Bilirubin Urine SMALL (*)    Leukocytes, UA TRACE (*)    All other components within normal limits  CBC WITH DIFFERENTIAL/PLATELET - Abnormal; Notable for the following:    RBC 3.18 (*)    Hemoglobin 8.9 (*)    HCT 27.8 (*)    RDW 17.8 (*)    All other components within normal limits  URINE MICROSCOPIC-ADD ON - Abnormal; Notable for the following:    Squamous Epithelial / LPF 6-30 (*)    Bacteria, UA FEW (*)    All other components within normal limits  I-STAT CG4 LACTIC ACID, ED - Abnormal; Notable for the following:    Lactic Acid, Venous 2.39 (*)    All other components within normal limits    EKG  EKG Interpretation None       Radiology Ct Abdomen Pelvis W Contrast  Result Date: 01/31/2016 CLINICAL DATA:  Acute onset of generalized abdominal pain. Initial encounter. EXAM: CT ABDOMEN AND PELVIS WITH CONTRAST TECHNIQUE: Multidetector CT imaging of the abdomen and pelvis was performed using the standard protocol following bolus administration of intravenous contrast. CONTRAST:  ISOVUE-300 IOPAMIDOL (ISOVUE-300) INJECTION 61% COMPARISON:  CT of the abdomen and pelvis from 05/24/2015  FINDINGS: Lower chest: A small to moderate mildly loculated right-sided pleural effusion is noted, with bibasilar airspace opacities. There is suggestion of underlying interstitial edema. The heart remains normal in size. Hepatobiliary: The mildly nodular contour of the liver is compatible with hepatic cirrhosis. Small volume ascites is seen tracking about the liver and spleen, and within the pelvis. Pancreas: The pancreas is grossly unremarkable in appearance. Spleen: The spleen is enlarged, measuring 19.5 cm in length. Adrenals/Urinary Tract: The adrenal glands are unremarkable in appearance. The kidneys are within normal limits. There is no evidence of hydronephrosis. No renal or ureteral stones are identified, though evaluation for renal stones is mildly suboptimal due to contrast in the renal calyces. No perinephric stranding is seen. Stomach/Bowel: The stomach is unremarkable in appearance. The small bowel is within normal limits. The patient is status post appendectomy. There is diffuse wall thickening along the cecum and ascending colon, with underlying trace complex fluid, which may reflect acute infectious or inflammatory colitis. Vascular/Lymphatic: The abdominal aorta is unremarkable in appearance. The inferior vena cava is grossly unremarkable. No retroperitoneal lymphadenopathy is seen. No pelvic sidewall lymphadenopathy is identified. Reproductive: The bladder is moderately distended and grossly unremarkable. The uterus is unremarkable in appearance. The ovaries are relatively symmetric. No suspicious adnexal masses are seen. Other: Mild soft tissue edema is noted along the abdominal wall. Musculoskeletal: No acute osseous abnormalities are identified. The visualized musculature is unremarkable in appearance. IMPRESSION: 1. Diffuse wall thickening along the cecum and ascending colon, with underlying trace complex fluid, which may reflect acute infectious or inflammatory colitis. 2. Small to moderate  mildly loculated right-sided pleural effusion, with bibasilar airspace opacities. Suggestion of underlying interstitial edema. 3. Findings of hepatic cirrhosis. 4. Small volume ascites within the abdomen and pelvis. 5. Splenomegaly. Electronically Signed   By: Roanna Raider M.D.   On: 01/31/2016 06:22    Procedures Procedures (including critical care time)   ++++++++++++++++++++++++++++++++++++++++++++++++++++  INCISION AND DRAINAGE Performed by: Lyanne Co Consent: Verbal consent obtained. Risks and benefits: risks, benefits and alternatives were discussed Time out performed prior to procedure Type: abscess Body area: right labia Anesthesia: local infiltration Incision was made with  a scalpel. Local anesthetic: lidocaine 2% with epinephrine Anesthetic total: 5 ml Complexity: complex Blunt dissection to break up loculations Drainage: purulent Drainage amount: moderate Packing material: none Patient tolerance: Patient tolerated the procedure well with no immediate complications.   ++++++++++++++++++++++++++++++++++++++++++++++++++    Medications Ordered in ED Medications - No data to display   Initial Impression / Assessment and Plan / ED Course  I have reviewed the triage vital signs and the nursing notes.  Pertinent labs & imaging results that were available during my care of the patient were reviewed by me and considered in my medical decision making (see chart for details).   Clinical Course   7:19 AM Patient feels better this time.  Right labial abscess status post incision and drainage.  Patient be discharged home on doxycycline.  In regards to her abdominal pain this appears to be a colitis and is likely viral in nature.  She denies diarrhea.  Primary care follow-up.  There is question as to whether or not she could have a loculated right-sided pleural effusion and there is reported opacities.  Clinically she has no shortness of breath and denies symptoms  suggestive of pneumonia.  Am not convinced the patient has pneumonia.  In reviewing her chart she has had pleural effusions in the past on the right as well.  He does appear to be slightly larger at this time.  I do not think she needs urgent/emergent treatment/evaluation of this.  I've asked that she follow-up with her primary care doctor in 3 days to reassess her symptoms.  She understands to return to the ER for new or worsening symptoms  Final Clinical Impressions(s) / ED Diagnoses   Final diagnoses:  Abdominal pain, unspecified abdominal location  Colitis  Labial abscess   I personally performed the services described in this documentation, which was scribed in my presence. The recorded information has been reviewed and is accurate.     New Prescriptions New Prescriptions   DOXYCYCLINE (VIBRAMYCIN) 100 MG CAPSULE    Take 1 capsule (100 mg total) by mouth 2 (two) times daily.     Katrina Bilis, MD 01/31/16 [phone]

## 2016-01-31 ENCOUNTER — Emergency Department (HOSPITAL_COMMUNITY): Payer: Medicaid Other

## 2016-01-31 LAB — I-STAT BETA HCG BLOOD, ED (MC, WL, AP ONLY): I-stat hCG, quantitative: <5 m[IU]/mL (ref <5)

## 2016-01-31 LAB — I-STAT CG4 LACTIC ACID, ED: LACTIC ACID, VENOUS: 1.51 mmol/L (ref 0.5–1.9)

## 2016-01-31 LAB — PREGNANCY, URINE: PREG TEST UR: NEGATIVE

## 2016-01-31 MED ORDER — DOXYCYCLINE HYCLATE 100 MG PO CAPS: 100.0000 mg | ORAL_CAPSULE | Freq: Two times a day (BID) | ORAL | 0 refills | Status: DC

## 2016-01-31 MED ORDER — IOPAMIDOL (ISOVUE-300) INJECTION 61%
INTRAVENOUS | Status: AC
  Administered 2016-01-31: 100 mL
  Filled 2016-01-31: qty 100

## 2016-01-31 MED ORDER — DOXYCYCLINE HYCLATE 100 MG PO TABS
100.0000 mg | ORAL_TABLET | Freq: Once | ORAL | Status: AC
  Administered 2016-01-31: 100 mg via ORAL
  Filled 2016-01-31: qty 1

## 2016-01-31 NOTE — ED Notes (Signed)
Pt is getting dressed and ready for discharge 

## 2016-01-31 NOTE — ED Notes (Signed)
Patient ambulated to the BR without difficulty

## 2016-01-31 NOTE — ED Notes (Signed)
Called CT patient has 2 people in front of her

## 2016-01-31 NOTE — ED Notes (Signed)
Called CT and patient has 2 people in front of her

## 2016-02-02 LAB — URINE CULTURE: Culture: >=100000 — AB

## 2016-02-04 ENCOUNTER — Telehealth (HOSPITAL_COMMUNITY): Payer: Self-pay

## 2016-02-04 NOTE — Telephone Encounter (Signed)
Post ED Visit - Positive Culture Follow-up  Culture report reviewed by antimicrobial stewardship pharmacist:  []  Enzo BiNathan Batchelder, Pharm.D. []  Celedonio MiyamotoJeremy Frens, Pharm.D., BCPS []  Garvin FilaMike Maccia, Pharm.D. []  Georgina PillionElizabeth Martin, Pharm.D., BCPS []  [place]villeMinh Pham, [address]Ph[address]D., BCPS, AAHIVP []  Estella HuskMichelle Turner, Pharm.D., BCPS, AAHIVP []  Tennis Mustassie Stewart, Pharm.D. []  Sherle Poeob Vincent, [address]Ph[address] Gertie GowdaX  Emily Stewart, Pharm.D.  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 Chart reviewed by [facility] PA  Arvid Rightlark, Shaheen Mende Dorn 02/04/2016, 11:51 AM

## 2016-02-13 ENCOUNTER — Ambulatory Visit (HOSPITAL_COMMUNITY): Payer: Medicaid Other

## 2016-02-26 ENCOUNTER — Ambulatory Visit (HOSPITAL_COMMUNITY)
Admission: RE | Admit: 2016-02-26 | Discharge: 2016-02-26 | Disposition: A | Discharge status: Home / Self Care | Payer: Medicaid Other | Source: Ambulatory Visit | Attending: Internal Medicine | Admitting: Internal Medicine

## 2016-02-26 ENCOUNTER — Other Ambulatory Visit (HOSPITAL_COMMUNITY): Payer: Self-pay | Admitting: Internal Medicine

## 2016-02-26 DIAGNOSIS — K7460 Unspecified cirrhosis of liver: Secondary | ICD-10-CM

## 2016-02-26 DIAGNOSIS — R188 Other ascites: Secondary | ICD-10-CM | POA: Insufficient documentation

## 2016-02-27 ENCOUNTER — Encounter (HOSPITAL_COMMUNITY): Payer: Self-pay | Admitting: *Deleted

## 2016-02-27 DIAGNOSIS — F17210 Nicotine dependence, cigarettes, uncomplicated: Secondary | ICD-10-CM | POA: Diagnosis not present

## 2016-02-27 DIAGNOSIS — E119 Type 2 diabetes mellitus without complications: Secondary | ICD-10-CM | POA: Insufficient documentation

## 2016-02-27 DIAGNOSIS — R1010 Upper abdominal pain, unspecified: Secondary | ICD-10-CM | POA: Diagnosis not present

## 2016-02-27 DIAGNOSIS — J449 Chronic obstructive pulmonary disease, unspecified: Secondary | ICD-10-CM | POA: Insufficient documentation

## 2016-02-27 DIAGNOSIS — R109 Unspecified abdominal pain: Secondary | ICD-10-CM | POA: Diagnosis present

## 2016-02-27 DIAGNOSIS — I10 Essential (primary) hypertension: Secondary | ICD-10-CM | POA: Insufficient documentation

## 2016-02-27 LAB — POC URINE PREG, ED: PREG TEST UR: NEGATIVE

## 2016-02-27 NOTE — ED Triage Notes (Signed)
Pt had an endoscopy today. C/o  Mid abdominal pain. Reports pain worsened with soup

## 2016-02-28 ENCOUNTER — Emergency Department (HOSPITAL_COMMUNITY): Payer: Medicaid Other

## 2016-02-28 ENCOUNTER — Emergency Department (HOSPITAL_COMMUNITY)
Admission: EM | Admit: 2016-02-28 | Discharge: 2016-02-28 | Disposition: A | Discharge status: Home / Self Care | Payer: Medicaid Other | Attending: Emergency Medicine | Admitting: Emergency Medicine

## 2016-02-28 DIAGNOSIS — R1010 Upper abdominal pain, unspecified: Secondary | ICD-10-CM

## 2016-02-28 LAB — CBC
HCT: 28.6 % — ABNORMAL LOW (ref 36.0–46.0)
Hemoglobin: 9 g/dL — ABNORMAL LOW (ref 12.0–15.0)
MCH: 29.1 pg (ref 26.0–34.0)
MCHC: 31.5 g/dL (ref 30.0–36.0)
MCV: 92.6 fL (ref 78.0–100.0)
PLATELETS: 79 10*3/uL — AB (ref 150–400)
RBC: 3.09 MIL/uL — AB (ref 3.87–5.11)
RDW: 16.7 % — ABNORMAL HIGH (ref 11.5–15.5)
WBC: 3.5 10*3/uL — AB (ref 4.0–10.5)

## 2016-02-28 LAB — URINALYSIS, ROUTINE W REFLEX MICROSCOPIC
Bilirubin Urine: NEGATIVE
GLUCOSE, UA: NEGATIVE mg/dL
Hgb urine dipstick: NEGATIVE
KETONES UR: NEGATIVE mg/dL
NITRITE: NEGATIVE
PROTEIN: NEGATIVE mg/dL
Specific Gravity, Urine: <1.005 — ABNORMAL LOW (ref 1.005–1.030)
pH: 6 (ref 5.0–8.0)

## 2016-02-28 LAB — COMPREHENSIVE METABOLIC PANEL
ALBUMIN: 2.8 g/dL — AB (ref 3.5–5.0)
ALK PHOS: 158 U/L — AB (ref 38–126)
ALT: 16 U/L (ref 14–54)
ANION GAP: 8 (ref 5–15)
AST: 34 U/L (ref 15–41)
BILIRUBIN TOTAL: 7.7 mg/dL — AB (ref 0.3–1.2)
BUN: <5 mg/dL — ABNORMAL LOW (ref 6–20)
CALCIUM: 8.5 mg/dL — AB (ref 8.9–10.3)
CO2: 23 mmol/L (ref 22–32)
Chloride: 99 mmol/L — ABNORMAL LOW (ref 101–111)
Creatinine, Ser: 0.81 mg/dL (ref 0.44–1.00)
GFR calc non Af Amer: >60 mL/min (ref >60)
GLUCOSE: 208 mg/dL — AB (ref 65–99)
POTASSIUM: 3.5 mmol/L (ref 3.5–5.1)
Sodium: 130 mmol/L — ABNORMAL LOW (ref 135–145)
TOTAL PROTEIN: 6.2 g/dL — AB (ref 6.5–8.1)

## 2016-02-28 LAB — URINE MICROSCOPIC-ADD ON

## 2016-02-28 LAB — LIPASE, BLOOD: Lipase: 91 U/L — ABNORMAL HIGH (ref 11–51)

## 2016-02-28 MED ORDER — MORPHINE SULFATE (PF) 4 MG/ML IV SOLN
4.0000 mg | Freq: Once | INTRAVENOUS | Status: AC
  Administered 2016-02-28: 4 mg via INTRAVENOUS
  Filled 2016-02-28: qty 1

## 2016-02-28 MED ORDER — IOPAMIDOL (ISOVUE-300) INJECTION 61%
INTRAVENOUS | Status: AC
  Administered 2016-02-28: 100 mL
  Filled 2016-02-28: qty 100

## 2016-02-28 MED ORDER — ONDANSETRON HCL 4 MG/2ML IJ SOLN
4.0000 mg | Freq: Once | INTRAMUSCULAR | Status: AC
  Administered 2016-02-28: 4 mg via INTRAVENOUS
  Filled 2016-02-28: qty 2

## 2016-02-28 MED ORDER — SODIUM CHLORIDE 0.9 % IV SOLN
INTRAVENOUS | Status: DC
  Administered 2016-02-28: 02:00:00 via INTRAVENOUS

## 2016-02-28 MED ORDER — METRONIDAZOLE 500 MG PO TABS: 500.0000 mg | ORAL_TABLET | Freq: Two times a day (BID) | ORAL | 0 refills | Status: DC

## 2016-02-28 MED ORDER — SODIUM CHLORIDE 0.9 % IV BOLUS (SEPSIS)
1000.0000 mL | Freq: Once | INTRAVENOUS | Status: AC
  Administered 2016-02-28: 1000 mL via INTRAVENOUS

## 2016-02-28 MED ORDER — AMOXICILLIN-POT CLAVULANATE 875-125 MG PO TABS: 1.0000 | ORAL_TABLET | Freq: Two times a day (BID) | ORAL | 0 refills | Status: DC

## 2016-02-28 MED ORDER — MORPHINE SULFATE (PF) 4 MG/ML IV SOLN
4.0000 mg | Freq: Once | INTRAVENOUS | Status: AC
Start: 2016-02-28 — End: 2016-02-28
  Administered 2016-02-28: 4 mg via INTRAVENOUS
  Filled 2016-02-28: qty 1

## 2016-02-28 NOTE — ED Notes (Signed)
Patient Alert and oriented X4. Stable and ambulatory. Patient verbalized understanding of the discharge instructions.  Patient belongings were taken by the patient.  

## 2016-02-28 NOTE — ED Provider Notes (Signed)
[facility]-EMERGENCY DEPT Provider Note   CSN: [id number] Arrival date & time: 02/27/16  2302   By signing my name below, I, Suzan Slick. Elon Spanner, attest that this documentation has been prepared under the direction and in the presence of Lorre Nick, MD.  Electronically Signed: Suzan Slick. Elon Spanner, ED Scribe. 02/28/16. 1:32 AM.   History   Chief Complaint Chief Complaint  Patient presents with  . Abdominal Pain   The history is provided by the patient. No language interpreter was used.    HPI Comments: Katrina Cohen is a 50 y.o. female with a PMHx of cirrhosis, HTN, and DM who presents to the Emergency Department complaining of constant, unchanged abdominal pain x 1 day. Pain is described as burning. Discomfort is exacerbated with pressure to abdomen and after eating. No alleviating factors at this time. No OTC/prescribed medications attempted prior to arrival. Denies any recent fever, chills, nausea, vomiting, or diarrhea. Pt underwent an endoscopy earlier today. She states she called the on call nurse regarding her pain and was advised to come to the emergency department for further evaluation.  PCP: [facility], FNP    Past Medical History:  Diagnosis Date  . Bipolar disorder (HCC) 1980s   with depression, anxiety  . Cirrhosis (HCC)   . Depression with anxiety 1980s   [facility] admission 05/2011.    . Diabetes mellitus   . Dysphagia 11/2014   normal esophagram 11/2014. EGD per Dr Dulce Sellar.   . Hypertension   . Jaundice 04/2015   In setting of infectious mononucleosis. Ultrasound in [place]: hepatosplenomegaly, portal htn, possible cirrhosis  . Mononucleosis 04/15/2015    Patient Active Problem List   Diagnosis Date Noted  . SOB (shortness of breath) 11/12/2015  . COPD exacerbation (HCC) 11/12/2015  . Acute respiratory failure with hypoxia (HCC) 11/12/2015  . CAP (community acquired pneumonia) 11/07/2015  . Cirrhosis (HCC) 11/07/2015  . Thrombocytopenia (HCC) 11/07/2015  . Alcohol  abuse 04/30/2015  . Acute hepatitis 04/30/2015  . Tobacco abuse 04/30/2015  . Malnutrition of moderate degree 04/29/2015  . Type 2 diabetes mellitus (HCC) 04/29/2015  . Hypokalemia 04/29/2015  . Anemia 04/29/2015  . Hyponatremia 04/29/2015  . Coagulopathy (HCC) 04/29/2015  . Hypoalbuminemia 04/29/2015  . Mononucleosis 04/28/2015  . Vitamin D deficiency 06/11/2011  . Major depression (HCC) 06/10/2011    Past Surgical History:  Procedure Laterality Date  . APPENDECTOMY    . CESAREAN SECTION    . COLONOSCOPY    . ESOPHAGOGASTRODUODENOSCOPY     Dr Dulce Sellar ? 2016.     OB History    No data available       Home Medications    Prior to Admission medications   Medication Sig Start Date End Date Taking? Authorizing Provider  albuterol (PROVENTIL HFA;VENTOLIN HFA) 108 (90 Base) MCG/ACT inhaler Inhale 2 puffs into the lungs every 4 (four) hours as needed for wheezing or shortness of breath.  09/02/15   Historical Provider, MD  albuterol (PROVENTIL) (2.5 MG/3ML) 0.083% nebulizer solution Take 3 mLs (2.5 mg total) by nebulization every 4 (four) hours as needed for wheezing or shortness of breath. 11/13/15   Standley Brooking, MD  ARIPiprazole (ABILIFY) 5 MG tablet Take 5 mg by mouth daily. 04/02/15   Historical Provider, MD  citalopram (CELEXA) 20 MG tablet Take 20 mg by mouth daily.    Historical Provider, MD  doxycycline (VIBRAMYCIN) 100 MG capsule Take 1 capsule (100 mg total) by mouth 2 (two) times daily. 01/31/16   Azalia Bilis, MD  LORazepam (ATIVAN) 1 MG tablet Take 1 mg by mouth at bedtime.     Historical Provider, MD  spironolactone (ALDACTONE) 50 MG tablet Take 100 mg by mouth daily.    Historical Provider, MD  tiZANidine (ZANAFLEX) 4 MG tablet Take 4 mg by mouth at bedtime as needed for muscle spasms.  01/10/16   Historical Provider, MD  traMADol (ULTRAM) 50 MG tablet Take 50 mg by mouth every 6 (six) hours as needed for moderate pain.  01/09/16 01/08/17  Historical Provider, MD     Family History Family History  Problem Relation Age of Onset  . Diabetes Mother   . Cancer Father   . Diabetes Sister   . Diabetes Brother     Social History Social History  Substance Use Topics  . Smoking status: Current Every Day Smoker    Packs/day: 1.00    Types: Cigarettes  . Smokeless tobacco: Never Used  . Alcohol use Yes     Comment: occasionally     Allergies   Review of patient's allergies indicates no known allergies.   Review of Systems Review of Systems  Constitutional: Negative for chills and fever.  Respiratory: Negative for shortness of breath.   Cardiovascular: Negative for chest pain.  Gastrointestinal: Positive for abdominal pain. Negative for diarrhea, nausea and vomiting.  All other systems reviewed and are negative.    Physical Exam Updated Vital Signs BP 129/66 (BP Location: Right Arm)   Pulse 73   Temp 98.4 F (36.9 C) (Oral)   Resp 18   LMP  (Approximate)   SpO2 100%   Physical Exam  Constitutional: She is oriented to person, place, and time. She appears well-developed and well-nourished. No distress.  HENT:  Head: Normocephalic.  Eyes: Conjunctivae are normal. Pupils are equal, round, and reactive to light. No scleral icterus.  Neck: Normal range of motion. Neck supple. No thyromegaly present.  Cardiovascular: Normal rate and regular rhythm.  Exam reveals no gallop and no friction rub.   No murmur heard. Pulmonary/Chest: Effort normal and breath sounds normal. No respiratory distress. She has no wheezes. She has no rales.  Abdominal: Soft. Bowel sounds are normal. She exhibits no distension. There is tenderness. There is no rebound.  Non radiating epigastric pain  Musculoskeletal: Normal range of motion.  Neurological: She is alert and oriented to person, place, and time.  Skin: Skin is warm and dry. No rash noted.  Psychiatric: She has a normal mood and affect. Her behavior is normal.     ED Treatments / Results    DIAGNOSTIC STUDIES: Oxygen Saturation is 100% on RA, Normal by my interpretation.    COORDINATION OF CARE: 1:20 AM- Will order imaging, blood work, and urinalysis. Discussed treatment plan with pt at bedside and pt agreed to plan.     Labs (all labs ordered are listed, but only abnormal results are displayed) Labs Reviewed  LIPASE, BLOOD - Abnormal; Notable for the following:       Result Value   Lipase 91 (*)    All other components within normal limits  COMPREHENSIVE METABOLIC PANEL - Abnormal; Notable for the following:    Sodium 130 (*)    Chloride 99 (*)    Glucose, Bld 208 (*)    BUN <5 (*)    Calcium 8.5 (*)    Total Protein 6.2 (*)    Albumin 2.8 (*)    Alkaline Phosphatase 158 (*)    Total Bilirubin 7.7 (*)    All other  components within normal limits  CBC - Abnormal; Notable for the following:    WBC 3.5 (*)    RBC 3.09 (*)    Hemoglobin 9.0 (*)    HCT 28.6 (*)    RDW 16.7 (*)    Platelets 79 (*)    All other components within normal limits  URINALYSIS, ROUTINE W REFLEX MICROSCOPIC (NOT AT [facility]) - Abnormal; Notable for the following:    Color, Urine STRAW (*)    Specific Gravity, Urine <1.005 (*)    Leukocytes, UA TRACE (*)    All other components within normal limits  URINE MICROSCOPIC-ADD ON - Abnormal; Notable for the following:    Squamous Epithelial / LPF 6-30 (*)    Bacteria, UA FEW (*)    All other components within normal limits  POC URINE PREG, ED    EKG  EKG Interpretation None       Radiology [place] Abdomen Limited  Result Date: 02/26/2016 CLINICAL DATA:  Abdominal discomfort. History of hepatic cirrhosis. Request evaluation for ascites and possible paracentesis. EXAM: LIMITED ABDOMINAL ULTRASOUND COMPARISON:  None. FINDINGS: Limited ultrasound of all 4 quadrants of the abdomen finds trace amount of perihepatic ascites. There is also small volume of pelvic ascites. This is insufficient and unsafe for paracentesis. Procedure not performed  IMPRESSION: Trace to small volume of pelvic and perihepatic ascites. Read by: Brayton ElKevin Bruning PA-C Electronically Signed   By: Malachy MoanHeath  McCullough M.D.   On: 02/26/2016 15:40    Procedures Procedures (including critical care time)  Medications Ordered in ED Medications - No data to display   Initial Impression / Assessment and Plan / ED Course  I have reviewed the triage vital signs and the nursing notes.  Pertinent labs & imaging results that were available during my care of the patient were reviewed by me and considered in my medical decision making (see chart for details).  Clinical Course   Patient medicated for pain here and feels better. CT scan is suspicious for pneumonia although patient has no clinical signs of this. She denies any cough or dyspnea. Does have some mild abdominal cramping and possible colitis. Will place on antibiotics as well as prescribed pain medication return precautions given  Final Clinical Impressions(s) / ED Diagnoses   Final diagnoses:  None    New Prescriptions New Prescriptions   No medications on file   I personally performed the services described in this documentation, which was scribed in my presence. The recorded information has been reviewed and is accurate.      Lorre NickAnthony Jackston [place], MD 02/28/16 [id number]

## 2016-02-28 NOTE — Discharge Instructions (Signed)
Take your home pain medication as needed. Return here for fever, vomiting, or any other problems

## 2016-02-28 NOTE — ED Notes (Signed)
Patient ambulated to the restroom

## 2016-03-11 ENCOUNTER — Encounter (HOSPITAL_COMMUNITY): Payer: Self-pay | Admitting: Emergency Medicine

## 2016-03-11 ENCOUNTER — Emergency Department (HOSPITAL_COMMUNITY)
Admission: EM | Admit: 2016-03-11 | Discharge: 2016-03-11 | Disposition: A | Discharge status: Home / Self Care | Payer: Medicaid Other | Attending: Emergency Medicine | Admitting: Emergency Medicine

## 2016-03-11 DIAGNOSIS — D689 Coagulation defect, unspecified: Secondary | ICD-10-CM | POA: Diagnosis not present

## 2016-03-11 DIAGNOSIS — D649 Anemia, unspecified: Secondary | ICD-10-CM | POA: Insufficient documentation

## 2016-03-11 DIAGNOSIS — D696 Thrombocytopenia, unspecified: Secondary | ICD-10-CM | POA: Insufficient documentation

## 2016-03-11 DIAGNOSIS — I10 Essential (primary) hypertension: Secondary | ICD-10-CM | POA: Insufficient documentation

## 2016-03-11 DIAGNOSIS — Z79899 Other long term (current) drug therapy: Secondary | ICD-10-CM | POA: Diagnosis not present

## 2016-03-11 DIAGNOSIS — J449 Chronic obstructive pulmonary disease, unspecified: Secondary | ICD-10-CM | POA: Diagnosis not present

## 2016-03-11 DIAGNOSIS — R5383 Other fatigue: Secondary | ICD-10-CM | POA: Diagnosis not present

## 2016-03-11 DIAGNOSIS — R55 Syncope and collapse: Secondary | ICD-10-CM | POA: Diagnosis present

## 2016-03-11 DIAGNOSIS — E119 Type 2 diabetes mellitus without complications: Secondary | ICD-10-CM | POA: Insufficient documentation

## 2016-03-11 DIAGNOSIS — F17210 Nicotine dependence, cigarettes, uncomplicated: Secondary | ICD-10-CM | POA: Diagnosis not present

## 2016-03-11 LAB — URINALYSIS, ROUTINE W REFLEX MICROSCOPIC
Glucose, UA: NEGATIVE mg/dL
Hgb urine dipstick: NEGATIVE
Ketones, ur: NEGATIVE mg/dL
Nitrite: NEGATIVE
Protein, ur: NEGATIVE mg/dL
Specific Gravity, Urine: 1.013 (ref 1.005–1.030)
pH: 7.5 (ref 5.0–8.0)

## 2016-03-11 LAB — BASIC METABOLIC PANEL
ANION GAP: 9 (ref 5–15)
CHLORIDE: 103 mmol/L (ref 101–111)
CO2: 25 mmol/L (ref 22–32)
Calcium: 8.9 mg/dL (ref 8.9–10.3)
Creatinine, Ser: 0.58 mg/dL (ref 0.44–1.00)
Glucose, Bld: 109 mg/dL — ABNORMAL HIGH (ref 65–99)
POTASSIUM: 3.6 mmol/L (ref 3.5–5.1)
SODIUM: 137 mmol/L (ref 135–145)

## 2016-03-11 LAB — RAPID URINE DRUG SCREEN, HOSP PERFORMED
Amphetamines: NOT DETECTED
BARBITURATES: NOT DETECTED
BENZODIAZEPINES: POSITIVE — AB
COCAINE: NOT DETECTED
Opiates: NOT DETECTED
Tetrahydrocannabinol: NOT DETECTED

## 2016-03-11 LAB — I-STAT VENOUS BLOOD GAS, ED
Acid-Base Excess: 3 mmol/L — ABNORMAL HIGH (ref 0.0–2.0)
BICARBONATE: 26.2 mmol/L (ref 20.0–28.0)
O2 Saturation: 93 %
PCO2 VEN: 34.6 mmHg — AB (ref 44.0–60.0)
PH VEN: 7.488 — AB (ref 7.250–7.430)
TCO2: 27 mmol/L (ref 0–100)
pO2, Ven: 63 mmHg — ABNORMAL HIGH (ref 32.0–45.0)

## 2016-03-11 LAB — CBC
HEMATOCRIT: 29.5 % — AB (ref 36.0–46.0)
Hemoglobin: 9.8 g/dL — ABNORMAL LOW (ref 12.0–15.0)
MCH: 30 pg (ref 26.0–34.0)
MCHC: 33.2 g/dL (ref 30.0–36.0)
MCV: 90.2 fL (ref 78.0–100.0)
Platelets: 83 10*3/uL — ABNORMAL LOW (ref 150–400)
RBC: 3.27 MIL/uL — AB (ref 3.87–5.11)
RDW: 15.8 % — AB (ref 11.5–15.5)
WBC: 4.2 10*3/uL (ref 4.0–10.5)

## 2016-03-11 LAB — PROTIME-INR
INR: 1.94
Prothrombin Time: 22.4 seconds — ABNORMAL HIGH (ref 11.4–15.2)

## 2016-03-11 LAB — HEPATIC FUNCTION PANEL
ALT: 17 U/L (ref 14–54)
AST: 47 U/L — ABNORMAL HIGH (ref 15–41)
Albumin: 2.9 g/dL — ABNORMAL LOW (ref 3.5–5.0)
Alkaline Phosphatase: 151 U/L — ABNORMAL HIGH (ref 38–126)
Bilirubin, Direct: 3.8 mg/dL — ABNORMAL HIGH (ref 0.1–0.5)
Indirect Bilirubin: 4.8 mg/dL — ABNORMAL HIGH (ref 0.3–0.9)
Total Bilirubin: 8.6 mg/dL — ABNORMAL HIGH (ref 0.3–1.2)
Total Protein: 6.6 g/dL (ref 6.5–8.1)

## 2016-03-11 LAB — URINE MICROSCOPIC-ADD ON

## 2016-03-11 LAB — AMMONIA: Ammonia: 33 umol/L (ref 9–35)

## 2016-03-11 NOTE — ED Provider Notes (Addendum)
[facility] Provider Note   CSN: [id number] Arrival date & time: 03/11/16  1438     History   Chief Complaint Chief Complaint  Patient presents with  . Loss of Consciousness    HPI Katrina Cohen is a 50 y.o. female.  HPI patient reports she lost consciousness morning while sitting at her therapist's office. She is uncertain if she fell asleep or suffered syncopal event and she is presently asymptomatic except feels very sleepy. No treatment prior to coming here. Nothing makes symptoms better or worse. Denies pain anywhere. Denies difficulty breathing. No treatment prior to coming here no other associated symptoms  Past Medical History:  Diagnosis Date  . Bipolar disorder (HCC) 1980s   with depression, anxiety  . Cirrhosis (HCC)   . Depression with anxiety 1980s   [facility] admission 05/2011.    . Diabetes mellitus   . Dysphagia 11/2014   normal esophagram 11/2014. EGD per Dr Dulce Sellar.   . Hypertension   . Jaundice 04/2015   In setting of infectious mononucleosis. Ultrasound in [place]: hepatosplenomegaly, portal htn, possible cirrhosis  . Mononucleosis 04/15/2015    Patient Active Problem List   Diagnosis Date Noted  . SOB (shortness of breath) 11/12/2015  . COPD exacerbation (HCC) 11/12/2015  . Acute respiratory failure with hypoxia (HCC) 11/12/2015  . CAP (community acquired pneumonia) 11/07/2015  . Cirrhosis (HCC) 11/07/2015  . Thrombocytopenia (HCC) 11/07/2015  . Alcohol abuse 04/30/2015  . Acute hepatitis 04/30/2015  . Tobacco abuse 04/30/2015  . Malnutrition of moderate degree 04/29/2015  . Type 2 diabetes mellitus (HCC) 04/29/2015  . Hypokalemia 04/29/2015  . Anemia 04/29/2015  . Hyponatremia 04/29/2015  . Coagulopathy (HCC) 04/29/2015  . Hypoalbuminemia 04/29/2015  . Mononucleosis 04/28/2015  . Vitamin D deficiency 06/11/2011  . Major depression (HCC) 06/10/2011    Past Surgical History:  Procedure Laterality Date  . APPENDECTOMY    . CESAREAN  SECTION    . COLONOSCOPY    . ESOPHAGOGASTRODUODENOSCOPY     Dr Dulce Sellar ? 2016.     OB History    No data available       Home Medications    Prior to Admission medications   Medication Sig Start Date End Date Taking? Authorizing Provider  albuterol (PROVENTIL HFA;VENTOLIN HFA) 108 (90 Base) MCG/ACT inhaler Inhale 2 puffs into the lungs every 4 (four) hours as needed for wheezing or shortness of breath.  09/02/15   Historical Provider, MD  albuterol (PROVENTIL) (2.5 MG/3ML) 0.083% nebulizer solution Take 3 mLs (2.5 mg total) by nebulization every 4 (four) hours as needed for wheezing or shortness of breath. Patient not taking: Reported on 02/28/2016 11/13/15   Standley Brooking, MD  amoxicillin-clavulanate (AUGMENTIN) 875-125 MG tablet Take 1 tablet by mouth 2 (two) times daily. 02/28/16   Lorre Nick, MD  ARIPiprazole (ABILIFY) 5 MG tablet Take 5 mg by mouth daily. 04/02/15   Historical Provider, MD  citalopram (CELEXA) 20 MG tablet Take 20 mg by mouth daily.    Historical Provider, MD  doxycycline (VIBRAMYCIN) 100 MG capsule Take 1 capsule (100 mg total) by mouth 2 (two) times daily. Patient not taking: Reported on 02/28/2016 01/31/16   Azalia Bilis, MD  esomeprazole (NEXIUM) 20 MG capsule Take 40 mg by mouth daily.    Historical Provider, MD  LORazepam (ATIVAN) 1 MG tablet Take 1 mg by mouth at bedtime.     Historical Provider, MD  metroNIDAZOLE (FLAGYL) 500 MG tablet Take 1 tablet (500 mg total) by mouth  2 (two) times daily. 02/28/16   Lorre Nick, MD  oxyCODONE (OXYCONTIN) 10 mg 12 hr tablet Take 10 mg by mouth 2 (two) times daily as needed (for pain).     Historical Provider, MD  spironolactone (ALDACTONE) 50 MG tablet Take 100 mg by mouth daily.    Historical Provider, MD  tiZANidine (ZANAFLEX) 4 MG tablet Take 4 mg by mouth at bedtime as needed for muscle spasms.  01/10/16   Historical Provider, MD    Family History Family History  Problem Relation Age of Onset  . Diabetes Mother    . Cancer Father   . Diabetes Sister   . Diabetes Brother     Social History Social History  Substance Use Topics  . Smoking status: Current Every Day Smoker    Packs/day: 1.00    Types: Cigarettes  . Smokeless tobacco: Never Used  . Alcohol use Yes     Comment: occasionally     Allergies   Review of patient's allergies indicates no known allergies.   Review of Systems Review of Systems  HENT: Negative.   Respiratory: Negative.   Cardiovascular: Negative.   Gastrointestinal: Negative.   Musculoskeletal: Negative.   Skin: Negative.   Allergic/Immunologic: Positive for immunocompromised state.       Diabetic, cirrhosis  Neurological: Positive for weakness.       Feels sleepy  Psychiatric/Behavioral: Negative.   All other systems reviewed and are negative.    Physical Exam Updated Vital Signs BP 138/69   Pulse 100   Temp 98.6 F (37 C) (Oral)   Resp 21   Ht 5\' 5"  (1.651 m)   Wt 157 lb (71.2 kg)   LMP  (Approximate)   SpO2 96%   BMI 26.13 kg/m   Physical Exam  Constitutional: She is oriented to person, place, and time.  chroniclly ill appearing ill-appearing, sleepy, arousable to verbal stimulus.  HENT:  Head: Normocephalic and atraumatic.  Eyes: Conjunctivae are normal. Pupils are equal, round, and reactive to light. Scleral icterus is present.  Neck: Neck supple. No tracheal deviation present. No thyromegaly present.  Cardiovascular: Normal rate and regular rhythm.   No murmur heard. Pulmonary/Chest: Effort normal and breath sounds normal.  Abdominal: Soft. Bowel sounds are normal. She exhibits no distension. There is no tenderness.  Musculoskeletal: Normal range of motion. She exhibits edema. She exhibits no tenderness.  Plus pretibial pitting edema bilaterally  Neurological: She is alert and oriented to person, place, and time. Coordination normal.  Sleepy, arousable to verbal stimulus. No asterixis.  Skin: Skin is warm and dry. No rash noted.    Jaundice  Psychiatric: She has a normal mood and affect.  Nursing note and vitals reviewed.    ED Treatments / Results  Labs (all labs ordered are listed, but only abnormal results are displayed) Labs Reviewed  BASIC METABOLIC PANEL - Abnormal; Notable for the following:       Result Value   Glucose, Bld 109 (*)    BUN <5 (*)    All other components within normal limits  CBC - Abnormal; Notable for the following:    RBC 3.27 (*)    Hemoglobin 9.8 (*)    HCT 29.5 (*)    RDW 15.8 (*)    Platelets 83 (*)    All other components within normal limits  URINALYSIS, ROUTINE W REFLEX MICROSCOPIC (NOT AT [facility])  HEPATIC FUNCTION PANEL  AMMONIA  PROTIME-INR  CBG MONITORING, ED    EKG  EKG Interpretation  Date/Time:  Thursday March 11 2016 14:49:53 EDT Ventricular Rate:  104 PR Interval:  138 QRS Duration: 82 QT Interval:  312 QTC Calculation: 410 R Axis:   100 Text Interpretation:  Sinus tachycardia Rightward axis Nonspecific ST and T wave abnormality Abnormal ECG When compared with ECG of 11/12/2015, No significant change was found Confirmed by [facility]  MD, DAVID ([id number]) on 03/11/2016 4:01:11 PM       Radiology No results found.  Procedures Procedures (including critical care time)  Medications Ordered in ED Medications - No data to display   Initial Impression / Assessment and Plan / ED Course  I have reviewed the triage vital signs and the nursing notes.  Pertinent labs & imaging results that were available during my care of the patient were reviewed by me and considered in my medical decision making (see chart for details).  Clinical Course    5:20 PM patient is easily arousable, ambulatory without difficulty. Not lightheaded on standing. Results for orders placed or performed during the hospital encounter of 03/11/16  Basic metabolic panel  Result Value Ref Range   Sodium 137 135 - 145 mmol/L   Potassium 3.6 3.5 - 5.1 mmol/L   Chloride 103 101 - 111  mmol/L   CO2 25 22 - 32 mmol/L   Glucose, Bld 109 (H) 65 - 99 mg/dL   BUN <5 (L) 6 - 20 mg/dL   Creatinine, Ser 6.040.58 0.44 - 1.00 mg/dL   Calcium 8.9 8.9 - 54.010.3 mg/dL   GFR calc non Af Amer >60 >60 mL/min   GFR calc Af Amer >60 >60 mL/min   Anion gap 9 5 - 15  CBC  Result Value Ref Range   WBC 4.2 4.0 - 10.5 K/uL   RBC 3.27 (L) 3.87 - 5.11 MIL/uL   Hemoglobin 9.8 (L) 12.0 - 15.0 g/dL   HCT 98.129.5 (L) 19.136.0 - 47.846.0 %   MCV 90.2 78.0 - 100.0 fL   MCH 30.0 26.0 - 34.0 pg   MCHC 33.2 30.0 - 36.0 g/dL   RDW 29.515.8 (H) 62.111.5 - 30.815.5 %   Platelets 83 (L) 150 - 400 K/uL  Urinalysis, Routine w reflex microscopic  Result Value Ref Range   Color, Urine AMBER (A) YELLOW   APPearance CLEAR CLEAR   Specific Gravity, Urine 1.013 1.005 - 1.030   pH 7.5 5.0 - 8.0   Glucose, UA NEGATIVE NEGATIVE mg/dL   Hgb urine dipstick NEGATIVE NEGATIVE   Bilirubin Urine SMALL (A) NEGATIVE   Ketones, ur NEGATIVE NEGATIVE mg/dL   Protein, ur NEGATIVE NEGATIVE mg/dL   Nitrite NEGATIVE NEGATIVE   Leukocytes, UA TRACE (A) NEGATIVE  Hepatic function panel  Result Value Ref Range   Total Protein 6.6 6.5 - 8.1 g/dL   Albumin 2.9 (L) 3.5 - 5.0 g/dL   AST 47 (H) 15 - 41 U/L   ALT 17 14 - 54 U/L   Alkaline Phosphatase 151 (H) 38 - 126 U/L   Total Bilirubin 8.6 (H) 0.3 - 1.2 mg/dL   Bilirubin, Direct 3.8 (H) 0.1 - 0.5 mg/dL   Indirect Bilirubin 4.8 (H) 0.3 - 0.9 mg/dL  Ammonia  Result Value Ref Range   Ammonia 33 9 - 35 umol/L  Protime-INR  Result Value Ref Range   Prothrombin Time 22.4 (H) 11.4 - 15.2 seconds   INR 1.94   Rapid urine drug screen (hospital performed)  Result Value Ref Range   Opiates NONE DETECTED NONE DETECTED   Cocaine NONE DETECTED  NONE DETECTED   Benzodiazepines POSITIVE (A) NONE DETECTED   Amphetamines NONE DETECTED NONE DETECTED   Tetrahydrocannabinol NONE DETECTED NONE DETECTED   Barbiturates NONE DETECTED NONE DETECTED  Urine microscopic-add on  Result Value Ref Range   Squamous  Epithelial / LPF 6-30 (A) NONE SEEN   WBC, UA 0-5 0 - 5 WBC/hpf   RBC / HPF 0-5 0 - 5 RBC/hpf   Bacteria, UA RARE (A) NONE SEEN  I-Stat venous blood gas, ED  Result Value Ref Range   pH, Ven 7.488 (H) 7.250 - 7.430   pCO2, Ven 34.6 (L) 44.0 - 60.0 mmHg   pO2, Ven 63.0 (H) 32.0 - 45.0 mmHg   Bicarbonate 26.2 20.0 - 28.0 mmol/L   TCO2 27 0 - 100 mmol/L   O2 Saturation 93.0 %   Acid-Base Excess 3.0 (H) 0.0 - 2.0 mmol/L   Patient temperature HIDE    Sample type VENOUS    Ct Abdomen Pelvis W Contrast  Result Date: 02/28/2016 CLINICAL DATA:  Acute onset of mid abdominal pain. Initial encounter. EXAM: CT ABDOMEN AND PELVIS WITH CONTRAST TECHNIQUE: Multidetector CT imaging of the abdomen and pelvis was performed using the standard protocol following bolus administration of intravenous contrast. CONTRAST:  ISOVUE-300 IOPAMIDOL (ISOVUE-300) INJECTION 61% COMPARISON:  CT empty abdomen and pelvis from 01/31/2016, and limited abdominal ultrasound performed 02/26/2016 FINDINGS: Lower chest: A small right pleural effusion is noted. There is partial consolidation of the right middle lobe, with air bronchograms, concerning for pneumonia. Mild interstitial prominence may reflect mild underlying interstitial edema. Hepatobiliary: There is a mildly nodular contour of the liver, concerning for hepatic cirrhosis. Peripherally increased attenuation at the right hepatic lobe is of uncertain significance. No dominant mass is seen. There is recanalization of the umbilical vein. A small amount of ascites is noted within the abdomen and pelvis. Minimal stones are noted within the gallbladder. The common bile duct remains normal in caliber. Pancreas: The pancreas is within normal limits. Spleen: The spleen is enlarged, measuring 19.1 cm in length. Adrenals/Urinary Tract: The adrenal glands are unremarkable in appearance. The kidneys are within normal limits. There is no evidence of hydronephrosis. No renal or ureteral  stones are identified. No perinephric stranding is seen. Stomach/Bowel: The stomach is unremarkable in appearance. The small bowel is within normal limits. The appendix is normal in caliber, without evidence of appendicitis. There is question of mild wall thickening at the cecum, which could reflect a mild infectious or inflammatory process. The colon is otherwise unremarkable in appearance. Vascular/Lymphatic: The abdominal aorta is unremarkable in appearance. The inferior vena cava is grossly unremarkable. No retroperitoneal lymphadenopathy is seen. No pelvic sidewall lymphadenopathy is identified. Reproductive: The bladder is mildly distended and within normal limits. The uterus is grossly unremarkable in appearance. The ovaries are relatively symmetric. No suspicious adnexal masses are seen. Other: Mild diffuse soft tissue edema is noted along the abdominal and pelvic wall, concerning for mild anasarca. Musculoskeletal: No acute osseous abnormalities are identified. The visualized musculature is unremarkable in appearance. IMPRESSION: 1. Question of mild wall thickening at the cecum, which could reflect a mild infectious or inflammatory process. 2. Small volume ascites within the abdomen and pelvis. Findings of hepatic cirrhosis, with recanalization of the umbilical vein. Peripherally increased attenuation at the right hepatic lobe is of uncertain significance. 3. Splenomegaly. 4. Small right pleural effusion. Partial consolidation of the right middle lung lobe, with air bronchograms, concerning for pneumonia. Mild interstitial prominence may reflect mild underlying interstitial edema.  5. Cholelithiasis.  Gallbladder otherwise grossly unremarkable. 6. Mild diffuse soft tissue edema along the abdominal and pelvic wall, concerning for mild anasarca. Electronically Signed   By: Roanna Raider M.D.   On: 02/28/2016 05:23   US Abdomen Limited  Result Date: 02/26/2016 CLINICAL DATA:  Abdominal discomfort. History  of hepatic cirrhosis. Request evaluation for ascites and possible paracentesis. EXAM: LIMITED ABDOMINAL ULTRASOUND COMPARISON:  None. FINDINGS: Limited ultrasound of all 4 quadrants of the abdomen finds trace amount of perihepatic ascites. There is also small volume of pelvic ascites. This is insufficient and unsafe for paracentesis. Procedure not performed IMPRESSION: Trace to small volume of pelvic and perihepatic ascites. Read by: Brayton El PA-C Electronically Signed   By: Malachy Moan M.D.   On: 02/26/2016 15:40  Laboratory assessment is at baseline in comparison to prior lab work. Patient exhibiting no signs of hepatic encephalopathy.. Patient is somnolent, doubt syncope. May be secondary to benzodiazepines. No signs of infection. She is instructed to follow-up with her primary care physician at [facility]  Final Clinical Impressions(s) / ED Diagnoses  Diagnoses #1 lethargy Final diagnoses:  None  #2 anemia #3 thrombocytopenia #4 hyperbilirubinemia #5 elevated liver function tests #6 coagulopathy New Prescriptions New Prescriptions   No medications on file     Doug Sou, MD 03/11/16 1741    Doug Sou, MD 03/11/16 1742

## 2016-03-11 NOTE — Discharge Instructions (Signed)
[facility] to arrange to be seen in the office.. Tell office staff that you were seen here today, and had lab work performed. They can get the lab results. Your medications may need to be adjusted. Return if you feel worse for any reason

## 2016-03-11 NOTE — ED Notes (Signed)
Pt informed of need for urine sample.

## 2016-03-11 NOTE — ED Triage Notes (Signed)
Pt states today at 9am she was speaking with her counselor and was told she was talking one minute and then became out of it and wasn't answering questions. Pt is now alert and ox4. Pt is lethargic and sleepy in triage.  Pt is jaundice and apperas to have ascites in abd with history of.

## 2016-04-01 ENCOUNTER — Encounter (HOSPITAL_COMMUNITY): Payer: Self-pay | Admitting: Emergency Medicine

## 2016-04-01 DIAGNOSIS — F319 Bipolar disorder, unspecified: Secondary | ICD-10-CM | POA: Diagnosis present

## 2016-04-01 DIAGNOSIS — D689 Coagulation defect, unspecified: Secondary | ICD-10-CM | POA: Diagnosis present

## 2016-04-01 DIAGNOSIS — Z79899 Other long term (current) drug therapy: Secondary | ICD-10-CM

## 2016-04-01 DIAGNOSIS — R161 Splenomegaly, not elsewhere classified: Secondary | ICD-10-CM | POA: Diagnosis present

## 2016-04-01 DIAGNOSIS — E871 Hypo-osmolality and hyponatremia: Secondary | ICD-10-CM | POA: Diagnosis present

## 2016-04-01 DIAGNOSIS — K7031 Alcoholic cirrhosis of liver with ascites: Principal | ICD-10-CM | POA: Diagnosis present

## 2016-04-01 DIAGNOSIS — E119 Type 2 diabetes mellitus without complications: Secondary | ICD-10-CM | POA: Diagnosis present

## 2016-04-01 DIAGNOSIS — I10 Essential (primary) hypertension: Secondary | ICD-10-CM | POA: Diagnosis present

## 2016-04-01 DIAGNOSIS — K766 Portal hypertension: Secondary | ICD-10-CM | POA: Diagnosis present

## 2016-04-01 DIAGNOSIS — E8809 Other disorders of plasma-protein metabolism, not elsewhere classified: Secondary | ICD-10-CM | POA: Diagnosis present

## 2016-04-01 DIAGNOSIS — D696 Thrombocytopenia, unspecified: Secondary | ICD-10-CM | POA: Diagnosis present

## 2016-04-01 DIAGNOSIS — F418 Other specified anxiety disorders: Secondary | ICD-10-CM | POA: Diagnosis present

## 2016-04-01 DIAGNOSIS — F17210 Nicotine dependence, cigarettes, uncomplicated: Secondary | ICD-10-CM | POA: Diagnosis present

## 2016-04-01 DIAGNOSIS — N390 Urinary tract infection, site not specified: Secondary | ICD-10-CM | POA: Diagnosis present

## 2016-04-01 DIAGNOSIS — K8020 Calculus of gallbladder without cholecystitis without obstruction: Secondary | ICD-10-CM | POA: Diagnosis present

## 2016-04-01 DIAGNOSIS — D649 Anemia, unspecified: Secondary | ICD-10-CM | POA: Diagnosis present

## 2016-04-01 DIAGNOSIS — G8929 Other chronic pain: Secondary | ICD-10-CM | POA: Diagnosis present

## 2016-04-01 DIAGNOSIS — K3189 Other diseases of stomach and duodenum: Secondary | ICD-10-CM | POA: Diagnosis present

## 2016-04-01 LAB — COMPREHENSIVE METABOLIC PANEL
ALBUMIN: 2.5 g/dL — AB (ref 3.5–5.0)
ALK PHOS: 178 U/L — AB (ref 38–126)
ALT: 15 U/L (ref 14–54)
AST: 39 U/L (ref 15–41)
Anion gap: 7 (ref 5–15)
BILIRUBIN TOTAL: 8.6 mg/dL — AB (ref 0.3–1.2)
BUN: 9 mg/dL (ref 6–20)
CALCIUM: 8.5 mg/dL — AB (ref 8.9–10.3)
CO2: 23 mmol/L (ref 22–32)
CREATININE: 0.74 mg/dL (ref 0.44–1.00)
Chloride: 98 mmol/L — ABNORMAL LOW (ref 101–111)
GFR calc Af Amer: >60 mL/min (ref >60)
GFR calc non Af Amer: >60 mL/min (ref >60)
GLUCOSE: 156 mg/dL — AB (ref 65–99)
Potassium: 4.2 mmol/L (ref 3.5–5.1)
SODIUM: 128 mmol/L — AB (ref 135–145)
TOTAL PROTEIN: 5.9 g/dL — AB (ref 6.5–8.1)

## 2016-04-01 LAB — CBC
HCT: 26.6 % — ABNORMAL LOW (ref 36.0–46.0)
Hemoglobin: 8.6 g/dL — ABNORMAL LOW (ref 12.0–15.0)
MCH: 29.6 pg (ref 26.0–34.0)
MCHC: 32.3 g/dL (ref 30.0–36.0)
MCV: 91.4 fL (ref 78.0–100.0)
PLATELETS: 71 10*3/uL — AB (ref 150–400)
RBC: 2.91 MIL/uL — ABNORMAL LOW (ref 3.87–5.11)
RDW: 16.3 % — AB (ref 11.5–15.5)
WBC: 4.9 10*3/uL (ref 4.0–10.5)

## 2016-04-01 LAB — URINALYSIS, ROUTINE W REFLEX MICROSCOPIC
Glucose, UA: NEGATIVE mg/dL
HGB URINE DIPSTICK: NEGATIVE
KETONES UR: NEGATIVE mg/dL
NITRITE: NEGATIVE
Protein, ur: NEGATIVE mg/dL
SPECIFIC GRAVITY, URINE: 1.015 (ref 1.005–1.030)
pH: 6 (ref 5.0–8.0)

## 2016-04-01 LAB — URINE MICROSCOPIC-ADD ON: RBC / HPF: NONE SEEN RBC/hpf (ref 0–5)

## 2016-04-01 LAB — LIPASE, BLOOD: Lipase: 59 U/L — ABNORMAL HIGH (ref 11–51)

## 2016-04-01 NOTE — ED Triage Notes (Signed)
Pt. reports RUQ pain with abdominal swelling/distention onset yesterday , denies emesis or diarrhea , no fever or chills .

## 2016-04-02 ENCOUNTER — Inpatient Hospital Stay (HOSPITAL_COMMUNITY)
Admission: EM | Admit: 2016-04-02 | Discharge: 2016-04-03 | DRG: 433 | Disposition: A | Discharge status: Home / Self Care | Payer: Medicaid Other | Attending: Internal Medicine | Admitting: Internal Medicine

## 2016-04-02 ENCOUNTER — Encounter (HOSPITAL_COMMUNITY): Payer: Self-pay | Admitting: Family Medicine

## 2016-04-02 ENCOUNTER — Emergency Department (HOSPITAL_COMMUNITY): Payer: Medicaid Other

## 2016-04-02 ENCOUNTER — Inpatient Hospital Stay (HOSPITAL_COMMUNITY): Payer: Medicaid Other

## 2016-04-02 DIAGNOSIS — E119 Type 2 diabetes mellitus without complications: Secondary | ICD-10-CM

## 2016-04-02 DIAGNOSIS — E8809 Other disorders of plasma-protein metabolism, not elsewhere classified: Secondary | ICD-10-CM | POA: Diagnosis present

## 2016-04-02 DIAGNOSIS — F418 Other specified anxiety disorders: Secondary | ICD-10-CM | POA: Diagnosis present

## 2016-04-02 DIAGNOSIS — K766 Portal hypertension: Secondary | ICD-10-CM | POA: Diagnosis present

## 2016-04-02 DIAGNOSIS — N390 Urinary tract infection, site not specified: Secondary | ICD-10-CM | POA: Diagnosis present

## 2016-04-02 DIAGNOSIS — K7460 Unspecified cirrhosis of liver: Secondary | ICD-10-CM | POA: Diagnosis present

## 2016-04-02 DIAGNOSIS — D649 Anemia, unspecified: Secondary | ICD-10-CM | POA: Diagnosis present

## 2016-04-02 DIAGNOSIS — K8020 Calculus of gallbladder without cholecystitis without obstruction: Secondary | ICD-10-CM | POA: Diagnosis present

## 2016-04-02 DIAGNOSIS — Z79899 Other long term (current) drug therapy: Secondary | ICD-10-CM | POA: Diagnosis not present

## 2016-04-02 DIAGNOSIS — E871 Hypo-osmolality and hyponatremia: Secondary | ICD-10-CM | POA: Diagnosis present

## 2016-04-02 DIAGNOSIS — G8929 Other chronic pain: Secondary | ICD-10-CM | POA: Diagnosis present

## 2016-04-02 DIAGNOSIS — I10 Essential (primary) hypertension: Secondary | ICD-10-CM | POA: Diagnosis present

## 2016-04-02 DIAGNOSIS — N3000 Acute cystitis without hematuria: Secondary | ICD-10-CM

## 2016-04-02 DIAGNOSIS — D689 Coagulation defect, unspecified: Secondary | ICD-10-CM

## 2016-04-02 DIAGNOSIS — K3189 Other diseases of stomach and duodenum: Secondary | ICD-10-CM | POA: Diagnosis present

## 2016-04-02 DIAGNOSIS — K7031 Alcoholic cirrhosis of liver with ascites: Secondary | ICD-10-CM | POA: Diagnosis present

## 2016-04-02 DIAGNOSIS — D696 Thrombocytopenia, unspecified: Secondary | ICD-10-CM | POA: Diagnosis present

## 2016-04-02 DIAGNOSIS — K8000 Calculus of gallbladder with acute cholecystitis without obstruction: Secondary | ICD-10-CM | POA: Diagnosis not present

## 2016-04-02 DIAGNOSIS — R1011 Right upper quadrant pain: Secondary | ICD-10-CM | POA: Diagnosis present

## 2016-04-02 DIAGNOSIS — R161 Splenomegaly, not elsewhere classified: Secondary | ICD-10-CM | POA: Diagnosis present

## 2016-04-02 DIAGNOSIS — F319 Bipolar disorder, unspecified: Secondary | ICD-10-CM | POA: Diagnosis present

## 2016-04-02 DIAGNOSIS — F17210 Nicotine dependence, cigarettes, uncomplicated: Secondary | ICD-10-CM | POA: Diagnosis present

## 2016-04-02 HISTORY — DX: Liver disease, unspecified: K76.9

## 2016-04-02 LAB — GLUCOSE, CAPILLARY: Glucose-Capillary: 129 mg/dL — ABNORMAL HIGH (ref 65–99)

## 2016-04-02 LAB — TYPE AND SCREEN
ABO/RH(D): O POS
Antibody Screen: NEGATIVE

## 2016-04-02 LAB — PROTIME-INR
INR: 1.98
Prothrombin Time: 22.8 seconds — ABNORMAL HIGH (ref 11.4–15.2)

## 2016-04-02 LAB — ABO/RH: ABO/RH(D): O POS

## 2016-04-02 LAB — APTT: APTT: 40 s — AB (ref 24–36)

## 2016-04-02 LAB — PROCALCITONIN: Procalcitonin: <0.1 ng/mL

## 2016-04-02 MED ORDER — LORAZEPAM 1 MG PO TABS
1.0000 mg | ORAL_TABLET | Freq: Every day | ORAL | Status: DC
  Administered 2016-04-02: 1 mg via ORAL
  Filled 2016-04-02: qty 1

## 2016-04-02 MED ORDER — TIZANIDINE HCL 4 MG PO TABS: 4.0000 mg | ORAL_TABLET | Freq: Every evening | ORAL | Status: DC | PRN

## 2016-04-02 MED ORDER — SODIUM CHLORIDE 0.9% FLUSH: 3.0000 mL | Freq: Two times a day (BID) | INTRAVENOUS | Status: DC

## 2016-04-02 MED ORDER — FENTANYL CITRATE (PF) 100 MCG/2ML IJ SOLN: 25.0000 ug | INTRAMUSCULAR | Status: DC | PRN

## 2016-04-02 MED ORDER — ARIPIPRAZOLE 5 MG PO TABS
5.0000 mg | ORAL_TABLET | Freq: Every day | ORAL | Status: DC
  Administered 2016-04-02: 5 mg via ORAL
  Filled 2016-04-02 (×3): qty 1

## 2016-04-02 MED ORDER — LIDOCAINE HCL (PF) 1 % IJ SOLN
INTRAMUSCULAR | Status: AC
  Filled 2016-04-02: qty 10

## 2016-04-02 MED ORDER — ALBUTEROL SULFATE (2.5 MG/3ML) 0.083% IN NEBU: 2.5000 mg | INHALATION_SOLUTION | RESPIRATORY_TRACT | Status: DC | PRN

## 2016-04-02 MED ORDER — ONDANSETRON HCL 4 MG/2ML IJ SOLN: 4.0000 mg | Freq: Four times a day (QID) | INTRAMUSCULAR | Status: DC | PRN

## 2016-04-02 MED ORDER — SODIUM CHLORIDE 0.9 % IV SOLN: 250.0000 mL | INTRAVENOUS | Status: DC | PRN

## 2016-04-02 MED ORDER — SPIRONOLACTONE 25 MG PO TABS
100.0000 mg | ORAL_TABLET | Freq: Every day | ORAL | Status: DC
  Administered 2016-04-02 – 2016-04-03 (×2): 100 mg via ORAL
  Filled 2016-04-02 (×3): qty 4

## 2016-04-02 MED ORDER — OXYCODONE HCL ER 10 MG PO T12A
10.0000 mg | EXTENDED_RELEASE_TABLET | Freq: Two times a day (BID) | ORAL | Status: DC
  Administered 2016-04-02 – 2016-04-03 (×3): 10 mg via ORAL
  Filled 2016-04-02 (×3): qty 1

## 2016-04-02 MED ORDER — POLYETHYLENE GLYCOL 3350 17 G PO PACK: 17.0000 g | PACK | Freq: Every day | ORAL | Status: DC | PRN

## 2016-04-02 MED ORDER — PIPERACILLIN-TAZOBACTAM 3.375 G IVPB
3.3750 g | Freq: Three times a day (TID) | INTRAVENOUS | Status: DC
  Administered 2016-04-02 – 2016-04-03 (×4): 3.375 g via INTRAVENOUS
  Filled 2016-04-02 (×6): qty 50

## 2016-04-02 MED ORDER — SODIUM CHLORIDE 0.9 % IV BOLUS (SEPSIS)
500.0000 mL | Freq: Once | INTRAVENOUS | Status: AC
  Administered 2016-04-02: 500 mL via INTRAVENOUS

## 2016-04-02 MED ORDER — ACETAMINOPHEN 325 MG PO TABS: 650.0000 mg | ORAL_TABLET | Freq: Four times a day (QID) | ORAL | Status: DC | PRN

## 2016-04-02 MED ORDER — CITALOPRAM HYDROBROMIDE 10 MG PO TABS
20.0000 mg | ORAL_TABLET | Freq: Every day | ORAL | Status: DC
  Administered 2016-04-02: 20 mg via ORAL
  Filled 2016-04-02 (×4): qty 2

## 2016-04-02 MED ORDER — ONDANSETRON HCL 4 MG PO TABS: 4.0000 mg | ORAL_TABLET | Freq: Four times a day (QID) | ORAL | Status: DC | PRN

## 2016-04-02 MED ORDER — DEXTROSE 5 % IV SOLN
2.0000 g | Freq: Once | INTRAVENOUS | Status: AC
  Administered 2016-04-02: 2 g via INTRAVENOUS
  Filled 2016-04-02: qty 2

## 2016-04-02 MED ORDER — PANTOPRAZOLE SODIUM 40 MG PO TBEC
40.0000 mg | DELAYED_RELEASE_TABLET | Freq: Every day | ORAL | Status: DC
  Administered 2016-04-02 – 2016-04-03 (×2): 40 mg via ORAL
  Filled 2016-04-02 (×2): qty 1

## 2016-04-02 MED ORDER — ACETAMINOPHEN 650 MG RE SUPP: 650.0000 mg | Freq: Four times a day (QID) | RECTAL | Status: DC | PRN

## 2016-04-02 MED ORDER — SODIUM CHLORIDE 0.9% FLUSH: 3.0000 mL | INTRAVENOUS | Status: DC | PRN

## 2016-04-02 NOTE — ED Notes (Signed)
ED Provider at bedside. 

## 2016-04-02 NOTE — Consult Note (Signed)
Reason for Consult:abdominal pain Referring Physician: Elmahi  Katrina Cohen is an 50 y.o. female.  HPI: asked to se pt for abdominal pain.  She complains of pain all over.  It is more severe in RUQ.  Has cirrhosis and is followed  At [facility].  U/S shows small gallstones.   CT shows cirrhosis and ascites.  WBC normal.  LFT show bilirubin of 8 and abnormal coags and albumin.  No fever.  GB wall thickened but unreliable with ascites.  Pt is being evaluated for OLT.   Past Medical History:  Diagnosis Date  . Bipolar disorder (HCC) 1980s   with depression, anxiety  . Cirrhosis (HCC)   . Depression with anxiety 1980s   [facility] admission 05/2011.    . Diabetes mellitus   . Dysphagia 11/2014   normal esophagram 11/2014. EGD per Dr Outlaw.   . Hypertension   . Jaundice 04/2015   In setting of infectious mononucleosis. Ultrasound in [place]: hepatosplenomegaly, portal htn, possible cirrhosis  . Liver disease   . Mononucleosis 04/15/2015    Past Surgical History:  Procedure Laterality Date  . APPENDECTOMY    . CESAREAN SECTION    . COLONOSCOPY    . ESOPHAGOGASTRODUODENOSCOPY     Dr Outlaw ? 2016.     Family History  Problem Relation Age of Onset  . Diabetes Mother   . Cancer Father   . Diabetes Sister   . Diabetes Brother     Social History:  reports that she has been smoking Cigarettes.  She has been smoking about 0.00 packs per day. She has never used smokeless tobacco. She reports that she drinks alcohol. She reports that she does not use drugs.  Allergies: No Known Allergies  Medications: I have reviewed the patient's current medications.  Results for orders placed or performed during the hospital encounter of 04/02/16 (from the past 48 hour(s))  Lipase, blood     Status: Abnormal   Collection Time: 04/01/16 10:15 PM  Result Value Ref Range   Lipase 59 (H) 11 - 51 U/L  Comprehensive metabolic panel     Status: Abnormal   Collection Time: 04/01/16 10:15 PM  Result Value Ref  Range   Sodium 128 (L) 135 - 145 mmol/L   Potassium 4.2 3.5 - 5.1 mmol/L   Chloride 98 (L) 101 - 111 mmol/L   CO2 23 22 - 32 mmol/L   Glucose, Bld 156 (H) 65 - 99 mg/dL   BUN 9 6 - 20 mg/dL   Creatinine, Ser 0.74 0.44 - 1.00 mg/dL   Calcium 8.5 (L) 8.9 - 10.3 mg/dL   Total Protein 5.9 (L) 6.5 - 8.1 g/dL   Albumin 2.5 (L) 3.5 - 5.0 g/dL   AST 39 15 - 41 U/L   ALT 15 14 - 54 U/L   Alkaline Phosphatase 178 (H) 38 - 126 U/L   Total Bilirubin 8.6 (H) 0.3 - 1.2 mg/dL   GFR calc non Af Amer >60 >60 mL/min   GFR calc Af Amer >60 >60 mL/min    Comment: (NOTE) The eGFR has been calculated using the CKD EPI equation. This calculation has not been validated in all clinical situations. eGFR's persistently <60 mL/min signify possible Chronic Kidney Disease.    Anion gap 7 5 - 15  CBC     Status: Abnormal   Collection Time: 04/01/16 10:15 PM  Result Value Ref Range   WBC 4.9 4.0 - 10.5 K/uL   RBC 2.91 (L) 3.87 - 5.11   MIL/uL   Hemoglobin 8.6 (L) 12.0 - 15.0 g/dL   HCT 26.6 (L) 36.0 - 46.0 %   MCV 91.4 78.0 - 100.0 fL   MCH 29.6 26.0 - 34.0 pg   MCHC 32.3 30.0 - 36.0 g/dL   RDW 16.3 (H) 11.5 - 15.5 %   Platelets 71 (L) 150 - 400 K/uL    Comment: SPECIMEN CHECKED FOR CLOTS REPEATED TO VERIFY PLATELET COUNT CONFIRMED BY SMEAR   Urinalysis, Routine w reflex microscopic     Status: Abnormal   Collection Time: 04/01/16 10:28 PM  Result Value Ref Range   Color, Urine ORANGE (A) YELLOW    Comment: BIOCHEMICALS MAY BE AFFECTED BY COLOR   APPearance CLOUDY (A) CLEAR   Specific Gravity, Urine 1.015 1.005 - 1.030   pH 6.0 5.0 - 8.0   Glucose, UA NEGATIVE NEGATIVE mg/dL   Hgb urine dipstick NEGATIVE NEGATIVE   Bilirubin Urine MODERATE (A) NEGATIVE   Ketones, ur NEGATIVE NEGATIVE mg/dL   Protein, ur NEGATIVE NEGATIVE mg/dL   Nitrite NEGATIVE NEGATIVE   Leukocytes, UA LARGE (A) NEGATIVE  Urine microscopic-add on     Status: Abnormal   Collection Time: 04/01/16 10:28 PM  Result Value Ref  Range   Squamous Epithelial / LPF 6-30 (A) NONE SEEN   WBC, UA TOO NUMEROUS TO COUNT 0 - 5 WBC/hpf   RBC / HPF NONE SEEN 0 - 5 RBC/hpf   Bacteria, UA FEW (A) NONE SEEN  APTT     Status: Abnormal   Collection Time: 04/02/16  5:30 AM  Result Value Ref Range   aPTT 40 (H) 24 - 36 seconds    Comment:        IF BASELINE aPTT IS ELEVATED, SUGGEST PATIENT RISK ASSESSMENT BE USED TO DETERMINE APPROPRIATE ANTICOAGULANT THERAPY.   Protime-INR     Status: Abnormal   Collection Time: 04/02/16  5:30 AM  Result Value Ref Range   Prothrombin Time 22.8 (H) 11.4 - 15.2 seconds   INR 1.98   Procalcitonin - Baseline     Status: None   Collection Time: 04/02/16  5:30 AM  Result Value Ref Range   Procalcitonin <0.10 ng/mL    Comment:        Interpretation: PCT (Procalcitonin) <= 0.5 ng/mL: Systemic infection (sepsis) is not likely. Local bacterial infection is possible. (NOTE)         ICU PCT Algorithm               Non ICU PCT Algorithm    ----------------------------     ------------------------------         PCT < 0.25 ng/mL                 PCT < 0.1 ng/mL     Stopping of antibiotics            Stopping of antibiotics       strongly encouraged.               strongly encouraged.    ----------------------------     ------------------------------       PCT level decrease by               PCT < 0.25 ng/mL       >= 80% from peak PCT       OR PCT 0.25 - 0.5 ng/mL          Stopping of antibiotics  encouraged.     Stopping of antibiotics           encouraged.    ----------------------------     ------------------------------       PCT level decrease by              PCT >= 0.25 ng/mL       < 80% from peak PCT        AND PCT >= 0.5 ng/mL            Continuin g antibiotics                                              encouraged.       Continuing antibiotics            encouraged.    ----------------------------     ------------------------------      PCT level increase compared          PCT > 0.5 ng/mL         with peak PCT AND          PCT >= 0.5 ng/mL             Escalation of antibiotics                                          strongly encouraged.      Escalation of antibiotics        strongly encouraged.   Glucose, capillary     Status: Abnormal   Collection Time: 04/02/16  6:27 AM  Result Value Ref Range   Glucose-Capillary 129 (H) 65 - 99 mg/dL   Comment 1 Notify RN    Comment 2 Document in Chart     US Abdomen Complete  Result Date: 04/02/2016 CLINICAL DATA:  Moderate constant right upper quadrant pain since 04/01/2016. History of liver disease, jaundice, cirrhosis, hypertension, diabetes, mononucleosis, increased spleen size. EXAM: ABDOMEN ULTRASOUND COMPLETE COMPARISON:  CT abdomen and pelvis 02/28/2016 FINDINGS: Gallbladder: Small stones in the dependent gallbladder. Mild gallbladder edema with wall thickness at the upper limits of normal, 3 mm. Layering sludge in the gallbladder. Murphy's sign is positive. Common bile duct: Diameter: 3 mm, normal Liver: Diffusely increased and heterogeneous liver parenchymal echotexture with nodular contour suggesting changes of cirrhosis. No focal liver lesions identified. IVC: No abnormality visualized. Pancreas: Limited visualization due to overlying bowel gas. Spleen: Spleen is enlarged, measuring 17.95 20.55 9 cm, volume 1723 mL. No focal lesions identified. Right Kidney: Length: 13.4 cm. Echogenicity within normal limits. No mass or hydronephrosis visualized. Left Kidney: Length: 13.2 cm. Echogenicity within normal limits. No mass or hydronephrosis visualized. Abdominal aorta: No aneurysm visualized. Other findings: Moderate abdominal ascites. IMPRESSION: Changes consistent with hepatic cirrhosis with splenic enlargement and moderate abdominal ascites. Cholelithiasis with positive Murphy's sign and pericholecystic edema. Changes nonspecific but are compatible with acute cholecystitis in the  appropriate clinical setting. Electronically Signed   By: Lucienne Capers M.D.   On: 04/02/2016 03:27    Review of Systems  Constitutional: Negative for chills and fever.  Eyes: Negative for blurred vision.  Respiratory: Negative for shortness of breath.   Cardiovascular: Positive for chest pain.  Gastrointestinal: Positive for abdominal pain.  Skin: Positive for itching.  Endo/Heme/Allergies: Bruises/bleeds  easily.  Psychiatric/Behavioral: Positive for depression.   Blood pressure (!) 99/50, pulse 77, temperature 99.3 F (37.4 C), temperature source Oral, resp. rate 16, SpO2 96 %. Physical Exam  Constitutional:  Yellow appearing from jaundice   HENT:  Head: Normocephalic.  Nystagmus noted   Eyes: Scleral icterus is present.  Neck: Normal range of motion.  Cardiovascular: Normal rate.   Respiratory: Effort normal.  GI:  Ascites diffuse mild tenderness  Liver edge palpable and hard   Tender over this area  Soft  Splenomegaly noted  No peritonitis   Musculoskeletal:  Muscle wasting     Assessment/Plan: Patient Active Problem List   Diagnosis Date Noted  . Right upper quadrant abdominal pain 04/02/2016  . UTI (urinary tract infection) 04/02/2016  . Cholelithiasis 04/02/2016  . Depression with anxiety 04/02/2016  . Chronic pain 04/02/2016  . SOB (shortness of breath) 11/12/2015  . COPD exacerbation ([place]) 11/12/2015  . Acute respiratory failure with hypoxia ([place]) 11/12/2015  . CAP (community acquired pneumonia) 11/07/2015  . Cirrhosis ([place]) 11/07/2015  . Thrombocytopenia ([place]) 11/07/2015  . Alcohol abuse 04/30/2015  . Acute hepatitis 04/30/2015  . Tobacco abuse 04/30/2015  . Malnutrition of moderate degree 04/29/2015  . Hypokalemia 04/29/2015  . Anemia 04/29/2015  . Hyponatremia 04/29/2015  . Coagulopathy ([place]) 04/29/2015  . Hypoalbuminemia 04/29/2015  . Mononucleosis 04/28/2015  . Vitamin D deficiency 06/11/2011  . 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  Ok to feed  Discharge per medicine   Lasondra Hodgkins A. 04/02/2016, 9:06 AM

## 2016-04-02 NOTE — Progress Notes (Signed)
Pt tolerated food well, no complaints.  MD notified.

## 2016-04-02 NOTE — Progress Notes (Signed)
Patient ID: Jory EeJennifer Cohen, female   DOB: August 15, 1965, 50 y.o.   MRN: [id number]  US paracentesis requested  Limited US Abd was performed Very small amount of fluid noted around liver Another small pocket in LLQ---bowel impeding safe access No safe window to access small pockets  No paracentesis performed

## 2016-04-02 NOTE — ED Notes (Signed)
RN attempted to start IV; 2nd RN to start IV  

## 2016-04-02 NOTE — H&P (Signed)
History and Physical    Katrina EeJennifer Archey WUJ:[id number] DOB: [birth year] DOA: 04/02/2016  PCP: Deneen HartsDD,ELIZABETH, FNP   Patient coming from: Home  Chief Complaint: RUQ abdominal pain   HPI: Katrina Cohen is a 50 y.o. female with medical history significant for cirrhosis with ascites, depression and anxiety, hypertension, and chronic pain who presents to the emergency department with acute right upper quadrant pain. Patient reports insidious development of acute pain in the right upper quadrant and upper back approximately 2 days ago, initially intermittent and mild, but becoming more frequent, longer lasting, and severe. Pain became particularly severe just prior to arrival while the patient was eating. She describes the pain as severe, crampy, now localized to the right upper quadrant, and without any alleviating factors identified. Patient denies nausea, vomiting, or diarrhea. There has been no fevers, chills, or sweats. Patient has had similar pain in the past that had resolved spontaneously. She also notes some increase in her abdominal swelling over the past couple weeks and states that she was evaluated for paracentesis, but was told there was not enough fluid.  ED Course: Upon arrival to the ED, patient is found to be be afebrile, saturating well on room air, with soft blood pressure, but vitals otherwise stable. Chemistry panels notable for hyponatremia to 128, alkaline phosphatase of 178, albumin 2.5, and total bilirubin of 8.6. CBC features a normocytic anemia with hemoglobin of 8.6, and a thrombocytopenia with platelets of 71,000. Lipase is elevated to 59 and urinalysis appears grossly infected. Right upper quadrant ultrasound was obtained in the emergency department and findings are consistent with hepatic cirrhosis and splenic enlargement with moderate ascites, as well as cholelithiasis with positive Murphy sign and pericholecystic edema. Gen. surgery was consulted by the ED physician and requested  a medical admission. Patient's blood pressure has remained on the soft side but stable and she has been in no apparent respiratory distress. She'll be admitted to the telemetry unit for ongoing evaluation and management of right upper quadrant pain with ultrasound suggestive of possible acute cholecystitis.  Review of Systems:  All other systems reviewed and apart from HPI, are negative.  Past Medical History:  Diagnosis Date  . Bipolar disorder (HCC) 1980s   with depression, anxiety  . Cirrhosis (HCC)   . Depression with anxiety 1980s   [facility]    . Diabetes mellitus   . Dysphagia 11/2014   normal esophagram 11/2014. EGD per Dr Dulce Sellarutlaw.   . Hypertension   . Jaundice 04/2015   In setting of infectious mononucleosis. Ultrasound in [place]: hepatosplenomegaly, portal htn, possible cirrhosis  . Liver disease   . Mononucleosis 04/15/2015    Past Surgical History:  Procedure Laterality Date  . APPENDECTOMY    . CESAREAN SECTION    . COLONOSCOPY    . ESOPHAGOGASTRODUODENOSCOPY     Dr Dulce Sellarutlaw ? 2016.      reports that she has been smoking Cigarettes.  She has been smoking about 0.00 packs per day. She has never used smokeless tobacco. She reports that she drinks alcohol. She reports that she does not use drugs.  No Known Allergies  Family History  Problem Relation Age of Onset  . Diabetes Mother   . Cancer Father   . Diabetes Sister   . Diabetes Brother      Prior to Admission medications   Medication Sig Start Date End Date Taking? Authorizing Provider  albuterol (PROVENTIL HFA;VENTOLIN HFA) 108 (90 Base) MCG/ACT inhaler Inhale 2 puffs into the lungs  every 4 (four) hours as needed for wheezing or shortness of breath.  09/02/15   Historical Provider, MD  albuterol (PROVENTIL) (2.5 MG/3ML) 0.083% nebulizer solution Take 3 mLs (2.5 mg total) by nebulization every 4 (four) hours as needed for wheezing or shortness of breath. 11/13/15   Standley Brookinganiel P Goodrich, MD    amoxicillin-clavulanate (AUGMENTIN) 875-125 MG tablet Take 1 tablet by mouth 2 (two) times daily. 02/28/16   Lorre NickAnthony Allen, MD  ARIPiprazole (ABILIFY) 5 MG tablet Take 5 mg by mouth daily. 04/02/15   Historical Provider, MD  citalopram (CELEXA) 20 MG tablet Take 20 mg by mouth daily.    Historical Provider, MD  doxycycline (VIBRAMYCIN) 100 MG capsule Take 1 capsule (100 mg total) by mouth 2 (two) times daily. Patient not taking: Reported on 03/11/2016 01/31/16   Azalia BilisKevin Campos, MD  esomeprazole (NEXIUM) 20 MG capsule Take 40 mg by mouth daily.    Historical Provider, MD  LORazepam (ATIVAN) 1 MG tablet Take 1 mg by mouth at bedtime.     Historical Provider, MD  metroNIDAZOLE (FLAGYL) 500 MG tablet Take 1 tablet (500 mg total) by mouth 2 (two) times daily. 02/28/16   Lorre NickAnthony Allen, MD  oxyCODONE (OXYCONTIN) 10 mg 12 hr tablet Take 10 mg by mouth 2 (two) times daily as needed (for pain).     Historical Provider, MD  spironolactone (ALDACTONE) 50 MG tablet Take 100 mg by mouth daily.    Historical Provider, MD  tiZANidine (ZANAFLEX) 4 MG tablet Take 4 mg by mouth at bedtime as needed for muscle spasms.  01/10/16   Historical Provider, MD    Physical Exam: Vitals:   04/02/16 0111 04/02/16 0130 04/02/16 0352 04/02/16 0430  BP: (!) 100/47 112/62 95/56 (!) 93/50  Pulse: 79 83 74 85  Resp: 16 16    Temp:      TempSrc:      SpO2: 98% 98% 99% 95%      Constitutional: NAD, calm, comfortable, jaundiced Eyes: PERTLA, lids and conjunctivae normal ENMT: Mucous membranes are moist. Posterior pharynx clear of any exudate or lesions.   Neck: normal, supple, no masses, no thyromegaly Respiratory: clear to auscultation bilaterally, no wheezing, no crackles. Normal respiratory effort.   Cardiovascular: S1 & S2 heard, regular rate and rhythm, grade 3 systolic murmur at apex. 2+ pitting edema b/l LEs. No significant JVD. Abdomen: mild distension with fluid wave, tender in RUQ without guarding or rebound  tenderness. No masses palpated. Bowel sounds normal.  Musculoskeletal: no clubbing / cyanosis. No joint deformity upper and lower extremities. Normal muscle tone.  Skin: no significant rashes, lesions, ulcers. Warm, dry, well-perfused. Jaundiced.  Neurologic: CN 2-12 grossly intact. Sensation intact, DTR normal. Strength 5/5 in all 4 limbs.  Psychiatric: Normal judgment and insight. Alert and oriented x 3. Normal mood and affect.     Labs on Admission: I have personally reviewed following labs and imaging studies  CBC:  Recent Labs Lab 04/01/16 2215  WBC 4.9  HGB 8.6*  HCT 26.6*  MCV 91.4  PLT 71*   Basic Metabolic Panel:  Recent Labs Lab 04/01/16 2215  NA 128*  K 4.2  CL 98*  CO2 23  GLUCOSE 156*  BUN 9  CREATININE 0.74  CALCIUM 8.5*   GFR: CrCl cannot be calculated (Unknown ideal weight.). Liver Function Tests:  Recent Labs Lab 04/01/16 2215  AST 39  ALT 15  ALKPHOS 178*  BILITOT 8.6*  PROT 5.9*  ALBUMIN 2.5*    Recent Labs  Lab 04/01/16 2215  LIPASE 59*   No results for input(s): AMMONIA in the last 168 hours. Coagulation Profile: No results for input(s): INR, PROTIME in the last 168 hours. Cardiac Enzymes: No results for input(s): CKTOTAL, CKMB, CKMBINDEX, TROPONINI in the last 168 hours. BNP (last 3 results) No results for input(s): PROBNP in the last 8760 hours. HbA1C: No results for input(s): HGBA1C in the last 72 hours. CBG: No results for input(s): GLUCAP in the last 168 hours. Lipid Profile: No results for input(s): CHOL, HDL, LDLCALC, TRIG, CHOLHDL, LDLDIRECT in the last 72 hours. Thyroid Function Tests: No results for input(s): TSH, T4TOTAL, FREET4, T3FREE, THYROIDAB in the last 72 hours. Anemia Panel: No results for input(s): VITAMINB12, FOLATE, FERRITIN, TIBC, IRON, RETICCTPCT in the last 72 hours. Urine analysis:    Component Value Date/Time   COLORURINE ORANGE (A) 04/01/2016 2228   APPEARANCEUR CLOUDY (A) 04/01/2016 2228    LABSPEC 1.015 04/01/2016 2228   PHURINE 6.0 04/01/2016 2228   GLUCOSEU NEGATIVE 04/01/2016 2228   HGBUR NEGATIVE 04/01/2016 2228   BILIRUBINUR MODERATE (A) 04/01/2016 2228   KETONESUR NEGATIVE 04/01/2016 2228   PROTEINUR NEGATIVE 04/01/2016 2228   UROBILINOGEN 1.0 06/07/2011 1303   NITRITE NEGATIVE 04/01/2016 2228   LEUKOCYTESUR LARGE (A) 04/01/2016 2228   Sepsis Labs: @LABRCNTIP (procalcitonin:4,lacticidven:4) )No results found for this or any previous visit (from the past 240 hour(s)).   Radiological Exams on Admission: US Abdomen Complete  Result Date: 04/02/2016 CLINICAL DATA:  Moderate constant right upper quadrant pain since 04/01/2016. History of liver disease, jaundice, cirrhosis, hypertension, diabetes, mononucleosis, increased spleen size. EXAM: ABDOMEN ULTRASOUND COMPLETE COMPARISON:  CT abdomen and pelvis 02/28/2016 FINDINGS: Gallbladder: Small stones in the dependent gallbladder. Mild gallbladder edema with wall thickness at the upper limits of normal, 3 mm. Layering sludge in the gallbladder. Murphy's sign is positive. Common bile duct: Diameter: 3 mm, normal Liver: Diffusely increased and heterogeneous liver parenchymal echotexture with nodular contour suggesting changes of cirrhosis. No focal liver lesions identified. IVC: No abnormality visualized. Pancreas: Limited visualization due to overlying bowel gas. Spleen: Spleen is enlarged, measuring 17.95 20.55 9 cm, volume 1723 mL. No focal lesions identified. Right Kidney: Length: 13.4 cm. Echogenicity within normal limits. No mass or hydronephrosis visualized. Left Kidney: Length: 13.2 cm. Echogenicity within normal limits. No mass or hydronephrosis visualized. Abdominal aorta: No aneurysm visualized. Other findings: Moderate abdominal ascites. IMPRESSION: Changes consistent with hepatic cirrhosis with splenic enlargement and moderate abdominal ascites. Cholelithiasis with positive Murphy's sign and pericholecystic edema. Changes  nonspecific but are compatible with acute cholecystitis in the appropriate clinical setting. Electronically Signed   By: Burman Nieves M.D.   On: 04/02/2016 03:27    EKG: Not performed, will obtain as appropriate.   Assessment/Plan  1. Acute RUQ pain with ?acute cholecystitis  - Presents with pain in RUQ and back; no fever or leukocytosis  - RUQ [place] features cholelithiasis and pericholecystic edema, suggesting possible acute cholecystitis  - Gen surgery has been consulted and is much appreciated  - Pt will be placed on empiric Zosyn and kept NPO   - She is coagulopathic and this would need to be addressed prior to surgery   2. Cirrhosis with ascites  - Attributed to EtOH; she reports abstinence since November 2013  - EGD from October 2017 with portal hypertensive gastropathy, but no varices  - MELD score is 27 on admission  - There is moderated ascites noted on [place]; abd is soft and there is no respiratory impairment -  She is managed with Aldactone and Nexium at home - She was given 500 cc NS in ED and may need diuresis as BP allows  - Albumin is 2.5; colloid may be better choice if IVF needed   3. Anemia, thrombocytopenia  - Hgb is 8.6 on admission and platelets are 71,000  - Hgb had been in 9-10 range previously and platelet count is stable  - There is no bleeding evident on admission; slight Hgb drop could be dilutional as she appears vol overloaded   - Type and screen will be performed    4. Hyponatremia  - Serum sodium is 128 on admission - Likely secondary to cirrhosis with effective arterial volume depletion; this is associated with poor prognosis  - She may benefit from addition of Lasix to her Aldactone    5. UTI  - Urine appears grossly infected and is sent for culture - She is being treated with empiric Zosyn as above  - Follow culture and tailor abx accordingly    6. Depression, anxiety  - Appears to be stable - Continue Celexa, Abilify, Ativan     DVT  prophylaxis: SCDs Code Status: Full  Family Communication: Son updated at bedside at patient's request Disposition Plan: Admit to telemetry Consults called: Gen surgery Admission status: Inpatient     Briscoe Deutscher, MD [facility] Pager [phone]  If 7PM-7AM, please contact night-coverage [email or web address] Password [email or web address]  04/02/2016, 5:06 AM

## 2016-04-02 NOTE — ED Notes (Signed)
Pt called out stating, "I have to use the bathroom very very badly". Went into room to assist pt to rest room. EDP came into room and pt stated that she would hold it to talk to EDP.

## 2016-04-02 NOTE — ED Notes (Signed)
Pt transported to US

## 2016-04-02 NOTE — Progress Notes (Signed)
PROGRESS NOTE    Katrina EeJennifer Bonine  ZOX:[id number] DOB: 02-Apr-1966 DOA: 04/02/2016 PCP: Deneen HartsDD,ELIZABETH, FNP   Brief Narrative:  Patient is a 50 yo female with history of cirrhosis and ascites, depression and anxiety, HTN, diabetes, and chronic pain. Presented to the ED last night 11/09 with a 2 day history of acute RUQ pain and back pain. Pain has been increasing in frequency and severity and associated with eating, with similar symptomology in the past. Also notes increased abdominal swelling for a couple weeks. Found to be afebrile with hyponatremia 128, hypoalbuminemia 2.5, hyperbilirubinemia 8.6, elevated alkaline phosphatase 178, anemia with Hgb 8.6, thrombocytopenia with platelets 71,000. Elevated lipase 59, urinalysis shows infection. U/S of RUQ showed known hepatic cirrhosis with splenic enlargement, moderate ascites, and cholelithiasis with positive Murphy sign and pericholecystic edema. Admitted to inpatient service with a working diagnosis of possible acute cholecystitis. General surgery consulted.    Assessment & Plan:   Principal Problem:   Right upper quadrant abdominal pain Active Problems:   Hyponatremia   Coagulopathy (HCC)   Cirrhosis (HCC)   Thrombocytopenia (HCC)   UTI (urinary tract infection)   Cholelithiasis   Depression with anxiety   Acute RUQ pain - Working diagnosis is acute cholecystitis - Presented with RUQ and back pain, no fever or leukocytosis - U/S of RUQ shows cholelithiasis and pericholecystic edema. - Gen surgery believes cholecystitis is less likely and does not recommend surgery due to risk  - Continue medical treatment with Zosyn. Ok to eat, if she tolerates her food will discharge home. - [facility] develops cholecystitis in the setting of liver disease  Cirrhosis with ascites  - History of alcohol use, reports abstinence since 03/2012  - EGD from 02/2016 with portal hypertensive gastropathy, no varices  - MELD score  27 on admission. Coagulopathic with INR 1.98  - Moderate ascites noted on U/S - Takes Aldactone and Nexium at home. Giving Aldactone and Protonix in the hospital - Given 500cc NS in ED and may need diuresis as BP allows. BP 99/50 this am - Has low-grade fever, abdominal pain and ascites, will rule out SBP, obtain diagnostic paracentesis, cover with antibiotics, until we get the results back.  Anemia, thrombocytopenia  - Hgb 8.6, platelets 71,000 on admission - Hgb has been 9-10 previously and platelet count is stable  - No bleeding evident on admission - Could be a dilutional cause due to being volume overloaded. - Type and screen completed. Monitor for now  Hyponatremia  - Serum sodium 128 on admission likely secondary to hepatic cirrhosis. - She may benefit from addition of Lasix to her Aldactone    UTI  - UA shows infection. Culture pending - She is being treated with empiric Zosyn - Follow culture and adjust treatment if needed  Depression, anxiety  - Appears to be stable - Continue Celexa, Abilify, Ativan     DVT prophylaxis: None Code Status: Full Family Communication: Son at bedside Disposition Plan: Inpatient. Discharge when medically stable   Consultants:   General surgery  Procedures:   RUQ U/S  Antimicrobials:   Zosyn   Subjective: Patient is in fetal position in bed with some pain, but no acute distress. No respiratory distress. Reports RUQ pain with pain to palpation and abdominal swelling.  Denies chest pain, cough, wheeze, sob, nausea, vomiting.  Objective: Vitals:   04/02/16 0352 04/02/16 0401 04/02/16 0430 04/02/16 0835  BP: 95/56 118/64 (!) 93/50 (!) 99/50  Pulse: 74 80 85  77  Resp:    16  Temp:    99.3 F (37.4 C)  TempSrc:    Oral  SpO2: 99% 96% 95% 96%    Intake/Output Summary (Last 24 hours) at 04/02/16 1024 Last data filed at 04/02/16 1016  Gross per 24 hour  Intake              100 ml  Output              400 ml  Net              -300 ml   There were no vitals filed for this visit.  Examination:  General exam: Appears uncomfortable but in no acute distress. Jaundiced Respiratory system: Clear to auscultation. Respiratory effort normal. Cardiovascular system: S1 & S2 heard, RRR. No JVD. 2+ bilateral pedal edema. Gastrointestinal system: Mild distension with positive fluid wave. RUQ tenderness to palpation, without guarding or rebound tenderness. Central nervous system: Alert and oriented. No focal neurological deficits. Extremities: Symmetric 5 x 5 power. Skin: No rashes, lesions or ulcers. Jaundiced. Psychiatry: Judgement and insight appear normal. Mood & affect appropriate.    Data Reviewed: I have personally reviewed following labs and imaging studies  CBC:  Recent Labs Lab 04/01/16 2215  WBC 4.9  HGB 8.6*  HCT 26.6*  MCV 91.4  PLT 71*   Basic Metabolic Panel:  Recent Labs Lab 04/01/16 2215  NA 128*  K 4.2  CL 98*  CO2 23  GLUCOSE 156*  BUN 9  CREATININE 0.74  CALCIUM 8.5*   GFR: CrCl cannot be calculated (Unknown ideal weight.). Liver Function Tests:  Recent Labs Lab 04/01/16 2215  AST 39  ALT 15  ALKPHOS 178*  BILITOT 8.6*  PROT 5.9*  ALBUMIN 2.5*    Recent Labs Lab 04/01/16 2215  LIPASE 59*   No results for input(s): AMMONIA in the last 168 hours. Coagulation Profile:  Recent Labs Lab 04/02/16 0530  INR 1.98   Cardiac Enzymes: No results for input(s): CKTOTAL, CKMB, CKMBINDEX, TROPONINI in the last 168 hours. BNP (last 3 results) No results for input(s): PROBNP in the last 8760 hours. HbA1C: No results for input(s): HGBA1C in the last 72 hours. CBG:  Recent Labs Lab 04/02/16 0627  GLUCAP 129*   Lipid Profile: No results for input(s): CHOL, HDL, LDLCALC, TRIG, CHOLHDL, LDLDIRECT in the last 72 hours. Thyroid Function Tests: No results for input(s): TSH, T4TOTAL, FREET4, T3FREE, THYROIDAB in the last 72 hours. Anemia Panel: No results for  input(s): VITAMINB12, FOLATE, FERRITIN, TIBC, IRON, RETICCTPCT in the last 72 hours. Sepsis Labs:  Recent Labs Lab 04/02/16 0530  PROCALCITON <0.10    No results found for this or any previous visit (from the past 240 hour(s)).    Radiology Studies: Koreas Abdomen Complete  Result Date: 04/02/2016 CLINICAL DATA:  Moderate constant right upper quadrant pain since 04/01/2016. History of liver disease, jaundice, cirrhosis, hypertension, diabetes, mononucleosis, increased spleen size. EXAM: ABDOMEN ULTRASOUND COMPLETE COMPARISON:  CT abdomen and pelvis 02/28/2016 FINDINGS: Gallbladder: Small stones in the dependent gallbladder. Mild gallbladder edema with wall thickness at the upper limits of normal, 3 mm. Layering sludge in the gallbladder. Murphy's sign is positive. Common bile duct: Diameter: 3 mm, normal Liver: Diffusely increased and heterogeneous liver parenchymal echotexture with nodular contour suggesting changes of cirrhosis. No focal liver lesions identified. IVC: No abnormality visualized. Pancreas: Limited visualization due to overlying bowel gas. Spleen: Spleen is enlarged, measuring 17.95 20.55 9 cm, volume 1723 mL.  No focal lesions identified. Right Kidney: Length: 13.4 cm. Echogenicity within normal limits. No mass or hydronephrosis visualized. Left Kidney: Length: 13.2 cm. Echogenicity within normal limits. No mass or hydronephrosis visualized. Abdominal aorta: No aneurysm visualized. Other findings: Moderate abdominal ascites. IMPRESSION: Changes consistent with hepatic cirrhosis with splenic enlargement and moderate abdominal ascites. Cholelithiasis with positive Murphy's sign and pericholecystic edema. Changes nonspecific but are compatible with acute cholecystitis in the appropriate clinical setting. Electronically Signed   By: Burman Nieves M.D.   On: 04/02/2016 03:27    Scheduled Meds: . ARIPiprazole  5 mg Oral Daily  . citalopram  20 mg Oral Daily  . LORazepam  1 mg Oral QHS    . oxyCODONE  10 mg Oral Q12H  . pantoprazole  40 mg Oral Daily  . piperacillin-tazobactam (ZOSYN)  IV  3.375 g Intravenous Q8H  . sodium chloride flush  3 mL Intravenous Q12H  . sodium chloride flush  3 mL Intravenous Q12H  . spironolactone  100 mg Oral Daily   Continuous Infusions: None   LOS: 0 days    Time spent: 30 minutes    Gerald Dexter, PA-S [facility] Pager [phone]  If 7PM-7AM, please contact night-coverage [email or web address] Password [email or web address] 04/02/2016, 10:24 AM

## 2016-04-02 NOTE — ED Notes (Signed)
Pt went to RR and returned

## 2016-04-02 NOTE — Progress Notes (Signed)
Pt refused bed alarm on. Will continue to do hourly rounding. 

## 2016-04-02 NOTE — ED Notes (Signed)
Admiting Provider at bedside. 

## 2016-04-02 NOTE — ED Notes (Signed)
Pt ambulated to bathroom 

## 2016-04-02 NOTE — ED Provider Notes (Signed)
[facility]-EMERGENCY DEPT Provider Note   CSN: [id number] Arrival date & time: 04/01/16  2205  By signing my name below, I, [name], attest that this documentation has been prepared under the direction and in the presence of [name], MD . Electronically Signed: [name], Scribe. 04/02/2016. 1:50 AM.    History   Chief Complaint Chief Complaint  Patient presents with  . Abdominal Pain     The history is provided by the patient. No language interpreter was used.    HPI Comments:  Katrina Cohen is a 50 y.o. female with a history of liver disease, who presents to the Emergency Department complaining of moderate, constant, RUQ abdominal pain since ~1200 yesterday (04/01/16). She reports associated abdominal distention. She notes she was eating a bagel at the time of onset. She denies nausea, vomiting, dysuria, and hematuria. No alleviating factors noted. Her last BM was yesterday. She has a h/o c-section and appendectomy. She believes her last paracentesis was 7-9 months ago.  Past Medical History:  Diagnosis Date  . Bipolar disorder (HCC) 1980s   with depression, anxiety  . Cirrhosis (HCC)   . Depression with anxiety 1980s   [facility] admission 05/2011.    . Diabetes mellitus   . Dysphagia 11/2014   normal esophagram 11/2014. EGD per Dr Dulce Sellar.   . Hypertension   . Jaundice 04/2015   In setting of infectious mononucleosis. Ultrasound in [place]: hepatosplenomegaly, portal htn, possible cirrhosis  . Liver disease   . Mononucleosis 04/15/2015    Patient Active Problem List   Diagnosis Date Noted  . Upper abdominal pain 04/02/2016  . SOB (shortness of breath) 11/12/2015  . COPD exacerbation (HCC) 11/12/2015  . Acute respiratory failure with hypoxia (HCC) 11/12/2015  . CAP (community acquired pneumonia) 11/07/2015  . Cirrhosis (HCC) 11/07/2015  . Thrombocytopenia (HCC) 11/07/2015  . Alcohol abuse 04/30/2015  . Acute hepatitis 04/30/2015  . Tobacco abuse 04/30/2015    . Malnutrition of moderate degree 04/29/2015  . Type 2 diabetes mellitus (HCC) 04/29/2015  . Hypokalemia 04/29/2015  . Anemia 04/29/2015  . Hyponatremia 04/29/2015  . Coagulopathy (HCC) 04/29/2015  . Hypoalbuminemia 04/29/2015  . Mononucleosis 04/28/2015  . Vitamin D deficiency 06/11/2011  . Major depression (HCC) 06/10/2011    Past Surgical History:  Procedure Laterality Date  . APPENDECTOMY    . CESAREAN SECTION    . COLONOSCOPY    . ESOPHAGOGASTRODUODENOSCOPY     Dr Dulce Sellar ? 2016.     OB History    No data available       Home Medications    Prior to Admission medications   Medication Sig Start Date End Date Taking? Authorizing Provider  albuterol (PROVENTIL HFA;VENTOLIN HFA) 108 (90 Base) MCG/ACT inhaler Inhale 2 puffs into the lungs every 4 (four) hours as needed for wheezing or shortness of breath.  09/02/15   Historical Provider, MD  albuterol (PROVENTIL) (2.5 MG/3ML) 0.083% nebulizer solution Take 3 mLs (2.5 mg total) by nebulization every 4 (four) hours as needed for wheezing or shortness of breath. 11/13/15   Standley Brooking, MD  amoxicillin-clavulanate (AUGMENTIN) 875-125 MG tablet Take 1 tablet by mouth 2 (two) times daily. 02/28/16   Lorre Nick, MD  ARIPiprazole (ABILIFY) 5 MG tablet Take 5 mg by mouth daily. 04/02/15   Historical Provider, MD  citalopram (CELEXA) 20 MG tablet Take 20 mg by mouth daily.    Historical Provider, MD  doxycycline (VIBRAMYCIN) 100 MG capsule Take 1 capsule (100 mg total) by mouth  2 (two) times daily. Patient not taking: Reported on 03/11/2016 01/31/16   Azalia BilisKevin Campos, MD  esomeprazole (NEXIUM) 20 MG capsule Take 40 mg by mouth daily.    Historical Provider, MD  LORazepam (ATIVAN) 1 MG tablet Take 1 mg by mouth at bedtime.     Historical Provider, MD  metroNIDAZOLE (FLAGYL) 500 MG tablet Take 1 tablet (500 mg total) by mouth 2 (two) times daily. 02/28/16   Lorre NickAnthony Allen, MD  oxyCODONE (OXYCONTIN) 10 mg 12 hr tablet Take 10 mg by mouth 2  (two) times daily as needed (for pain).     Historical Provider, MD  spironolactone (ALDACTONE) 50 MG tablet Take 100 mg by mouth daily.    Historical Provider, MD  tiZANidine (ZANAFLEX) 4 MG tablet Take 4 mg by mouth at bedtime as needed for muscle spasms.  01/10/16   Historical Provider, MD    Family History Family History  Problem Relation Age of Onset  . Diabetes Mother   . Cancer Father   . Diabetes Sister   . Diabetes Brother     Social History Social History  Substance Use Topics  . Smoking status: Current Every Day Smoker    Packs/day: 0.00    Types: Cigarettes  . Smokeless tobacco: Never Used  . Alcohol use Yes     Comment: occasionally     Allergies   Patient has no known allergies.   Review of Systems Review of Systems  Constitutional: Negative for chills, fatigue and fever.  Respiratory: Negative for cough and shortness of breath.   Cardiovascular: Negative for chest pain.  Gastrointestinal: Positive for abdominal distention and abdominal pain. Negative for blood in stool, constipation, diarrhea, nausea and vomiting.  Genitourinary: Negative for dysuria and hematuria.  Musculoskeletal: Positive for back pain and myalgias. Negative for neck pain and neck stiffness.  Skin: Negative for rash and wound.  Neurological: Negative for dizziness, weakness, light-headedness, numbness and headaches.  All other systems reviewed and are negative.    Physical Exam Updated Vital Signs BP 95/56   Pulse 74   Temp 98.5 F (36.9 C) (Oral)   Resp 16   LMP  (Approximate)   SpO2 99%   Physical Exam  Constitutional: She is oriented to person, place, and time. She appears well-developed and well-nourished. No distress.  HENT:  Head: Normocephalic and atraumatic.  Mouth/Throat: Oropharynx is clear and moist. No oropharyngeal exudate.  Eyes: EOM are normal. Pupils are equal, round, and reactive to light. Scleral icterus is present.  Neck: Normal range of motion. Neck  supple.  No meningismus  Cardiovascular: Normal rate and regular rhythm.  Exam reveals no gallop and no friction rub.   No murmur heard. Pulmonary/Chest: Effort normal and breath sounds normal. No respiratory distress. She has no wheezes. She has no rales. She exhibits no tenderness.  Abdominal: Soft. Bowel sounds are normal. She exhibits distension. There is tenderness. There is no rebound and no guarding.  Diffuse abdominal tenderness but appears most pronounced in the right upper quadrant. There is no rebound or guarding.  Musculoskeletal: Normal range of motion. She exhibits tenderness. She exhibits no edema.  Diffuse bilateral paraspinal lumbar muscular tenderness. No midline thoracic or lumbar tenderness. No definite CVA tenderness bilaterally. 3+ bilateral lower extremity pitting edema. No asymmetry.  Neurological: She is alert and oriented to person, place, and time.  Moving all extremities without deficit. Sensation is fully intact.  Skin: Skin is warm and dry. Capillary refill takes less than 2 seconds. No rash  noted. No erythema.  Psychiatric: She has a normal mood and affect. Her behavior is normal.  Nursing note and vitals reviewed.    ED Treatments / Results  DIAGNOSTIC STUDIES:  Oxygen Saturation is 98% on RA, normal by my interpretation.    COORDINATION OF CARE:  1:38 AM Discussed treatment plan with pt at bedside and pt agreed to plan.  Labs (all labs ordered are listed, but only abnormal results are displayed) Labs Reviewed  LIPASE, BLOOD - Abnormal; Notable for the following:       Result Value   Lipase 59 (*)    All other components within normal limits  COMPREHENSIVE METABOLIC PANEL - Abnormal; Notable for the following:    Sodium 128 (*)    Chloride 98 (*)    Glucose, Bld 156 (*)    Calcium 8.5 (*)    Total Protein 5.9 (*)    Albumin 2.5 (*)    Alkaline Phosphatase 178 (*)    Total Bilirubin 8.6 (*)    All other components within normal limits  CBC -  Abnormal; Notable for the following:    RBC 2.91 (*)    Hemoglobin 8.6 (*)    HCT 26.6 (*)    RDW 16.3 (*)    Platelets 71 (*)    All other components within normal limits  URINALYSIS, ROUTINE W REFLEX MICROSCOPIC (NOT [facility]) - Abnormal; Notable for the following:    Color, Urine ORANGE (*)    APPearance CLOUDY (*)    Bilirubin Urine MODERATE (*)    Leukocytes, UA LARGE (*)    All other components within normal limits  URINE MICROSCOPIC-ADD ON - Abnormal; Notable for the following:    Squamous Epithelial / LPF 6-30 (*)    Bacteria, UA FEW (*)    All other components within normal limits  URINE CULTURE  APTT  PROTIME-INR    EKG  EKG Interpretation None       Radiology [place]as Abdomen Complete  Result Date: 04/02/2016 CLINICAL DATA:  Moderate constant right upper quadrant pain since 04/01/2016. History of liver disease, jaundice, cirrhosis, hypertension, diabetes, mononucleosis, increased spleen size. EXAM: ABDOMEN ULTRASOUND COMPLETE COMPARISON:  CT abdomen and pelvis 02/28/2016 FINDINGS: Gallbladder: Small stones in the dependent gallbladder. Mild gallbladder edema with wall thickness at the upper limits of normal, 3 mm. Layering sludge in the gallbladder. Murphy's sign is positive. Common bile duct: Diameter: 3 mm, normal Liver: Diffusely increased and heterogeneous liver parenchymal echotexture with nodular contour suggesting changes of cirrhosis. No focal liver lesions identified. IVC: No abnormality visualized. Pancreas: Limited visualization due to overlying bowel gas. Spleen: Spleen is enlarged, measuring 17.95 20.55 9 cm, volume 1723 mL. No focal lesions identified. Right Kidney: Length: 13.4 cm. Echogenicity within normal limits. No mass or hydronephrosis visualized. Left Kidney: Length: 13.2 cm. Echogenicity within normal limits. No mass or hydronephrosis visualized. Abdominal aorta: No aneurysm visualized. Other findings: Moderate abdominal ascites. IMPRESSION: Changes  consistent with hepatic cirrhosis with splenic enlargement and moderate abdominal ascites. Cholelithiasis with positive Murphy's sign and pericholecystic edema. Changes nonspecific but are compatible with acute cholecystitis in the appropriate clinical setting. Electronically Signed   By: Burman NievesWilliam  Stevens M.D.   On: 04/02/2016 03:27    Procedures Procedures (including critical care time)  Medications Ordered in ED Medications  cefTRIAXone (ROCEPHIN) 2 g in dextrose 5 % 50 mL IVPB (not administered)  sodium chloride 0.9 % bolus 500 mL (not administered)     Initial Impression / Assessment and Plan /  ED Course  I have reviewed the triage vital signs and the nursing notes.  Pertinent labs & imaging results that were available during my care of the patient were reviewed by me and considered in my medical decision making (see chart for details).  Clinical Course   Discussed with Dr. Corliss Skains. Advised antibiotics per protocol and will consult on the patient. We'll discuss with medicine regarding admission.  Discussed with Dr.Opyd and will admit.  Final Clinical Impressions(s) / ED Diagnoses   Final diagnoses:  Right upper quadrant abdominal pain    New Prescriptions New Prescriptions   No medications on file   I personally performed the services described in this documentation, which was scribed in my presence. The recorded information has been reviewed and is accurate.       [name], MD 04/02/16 [phone]

## 2016-04-03 DIAGNOSIS — D696 Thrombocytopenia, unspecified: Secondary | ICD-10-CM

## 2016-04-03 DIAGNOSIS — F418 Other specified anxiety disorders: Secondary | ICD-10-CM

## 2016-04-03 DIAGNOSIS — E871 Hypo-osmolality and hyponatremia: Secondary | ICD-10-CM

## 2016-04-03 DIAGNOSIS — K7031 Alcoholic cirrhosis of liver with ascites: Principal | ICD-10-CM

## 2016-04-03 DIAGNOSIS — K8000 Calculus of gallbladder with acute cholecystitis without obstruction: Secondary | ICD-10-CM

## 2016-04-03 LAB — COMPREHENSIVE METABOLIC PANEL
ALK PHOS: 138 U/L — AB (ref 38–126)
ALT: 15 U/L (ref 14–54)
AST: 37 U/L (ref 15–41)
Albumin: 2.4 g/dL — ABNORMAL LOW (ref 3.5–5.0)
Anion gap: 5 (ref 5–15)
BILIRUBIN TOTAL: 9.3 mg/dL — AB (ref 0.3–1.2)
BUN: 6 mg/dL (ref 6–20)
CALCIUM: 8.4 mg/dL — AB (ref 8.9–10.3)
CO2: 27 mmol/L (ref 22–32)
CREATININE: 0.86 mg/dL (ref 0.44–1.00)
Chloride: 103 mmol/L (ref 101–111)
Glucose, Bld: 144 mg/dL — ABNORMAL HIGH (ref 65–99)
Potassium: 3.9 mmol/L (ref 3.5–5.1)
Sodium: 135 mmol/L (ref 135–145)
TOTAL PROTEIN: 5.3 g/dL — AB (ref 6.5–8.1)

## 2016-04-03 LAB — GLUCOSE, CAPILLARY: GLUCOSE-CAPILLARY: 161 mg/dL — AB (ref 65–99)

## 2016-04-03 LAB — CBC
HCT: 24.3 % — ABNORMAL LOW (ref 36.0–46.0)
Hemoglobin: 8 g/dL — ABNORMAL LOW (ref 12.0–15.0)
MCH: 30 pg (ref 26.0–34.0)
MCHC: 32.9 g/dL (ref 30.0–36.0)
MCV: 91 fL (ref 78.0–100.0)
PLATELETS: 68 10*3/uL — AB (ref 150–400)
RBC: 2.67 MIL/uL — AB (ref 3.87–5.11)
RDW: 16.2 % — AB (ref 11.5–15.5)
WBC: 4.5 10*3/uL (ref 4.0–10.5)

## 2016-04-03 MED ORDER — CEFUROXIME AXETIL 500 MG PO TABS: 500.0000 mg | ORAL_TABLET | Freq: Two times a day (BID) | ORAL | 0 refills | Status: DC

## 2016-04-03 NOTE — Progress Notes (Signed)
Patient is discharge to home accompanied by patient's family members and NT via wheelchair. Discharge instructions given . Patient verbalizes understanding. All personal belongings given. Telemetry box and IV removed prior to discharge and site in good condition.  

## 2016-04-03 NOTE — Discharge Summary (Signed)
Physician Discharge Summary  Katrina Cohen WUJ:[id number] DOB: 1966-03-14 DOA: 04/02/2016  PCP: Deneen Harts, FNP  Admit date: 04/02/2016 Discharge date: 04/03/2016  Admitted From: Home Disposition: Home  Recommendations for Outpatient Follow-up:  1. Follow up with PCP in 1-2 weeks 2. Please obtain BMP/CBC in one week  Home Health: NA Equipment/Devices:NA  Discharge Condition: Stable CODE STATUS: Full Code Diet recommendation: Diet 2 gram sodium Room service appropriate? Yes; Fluid consistency: Thin Diet - low sodium heart healthy  Brief/Interim Summary: Katrina Cohen is a 50 y.o. female with medical history significant for cirrhosis with ascites, depression and anxiety, hypertension, and chronic pain who presents to the emergency department with acute right upper quadrant pain. Patient reports insidious development of acute pain in the right upper quadrant and upper back approximately 2 days ago, initially intermittent and mild, but becoming more frequent, longer lasting, and severe. Pain became particularly severe just prior to arrival while the patient was eating. She describes the pain as severe, crampy, now localized to the right upper quadrant, and without any alleviating factors identified. Patient denies nausea, vomiting, or diarrhea. There has been no fevers, chills, or sweats. Patient has had similar pain in the past that had resolved spontaneously. She also notes some increase in her abdominal swelling over the past couple weeks and states that she was evaluated for paracentesis, but was told there was not enough fluid.   Discharge Diagnoses:  Principal Problem:   Right upper quadrant abdominal pain Active Problems:   Hyponatremia   Coagulopathy (HCC)   Cirrhosis (HCC)   Thrombocytopenia (HCC)   UTI (urinary tract infection)   Cholelithiasis   Depression with anxiety   Acute RUQ pain - Presented with RUQ and back pain, no fever or leukocytosis - U/S of RUQ shows  cholelithiasis and pericholecystic edema. - Gen surgery believes cholecystitis is less likely and does not recommend surgery due to risk  - Will need to follow-up with [facility] if she develops cholecystitis in the setting of liver disease. - RUQ abdominal pain is likely secondary to stretching of the hepatic capsule with fluids.  UTI - UA consistent with UTI, patient was on Zosyn on admission. - Patient point to be discharged prior to culture returned, previous cultures showed E-coli and Hafnia Alvei. - Ceftin for 5 more days prescribed.  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 Coagulopathic with INR 1.98  - Moderate ascites noted on U/S - Takes Aldactone and Nexium at home. Giving Aldactone and Protonix in the hospital (not of Lasix) - Given 500cc NS in ED and may need diuresis as BP allows. BP 99/50 this am - Had fever, abdominal pain and ascites, tried to rule out SBP, diagnostic paracentesis tried but reported not enough fluids.  Anemia, thrombocytopenia  - Hgb 8.6, platelets 71,000 on admission - Hgb has been 9-10 previously and platelet count is stable  - No bleeding evident on admission - Dilution of versus pancytopenia secondary to hepatic cirrhosis.  Hyponatremia  - Serum sodium 128 on admission likely secondary to hepatic cirrhosis. - She may benefit from addition of Lasix to her Aldactone as outpatient.   Depression, anxiety  - Appears to be stable - Continue Celexa, Abilify, Ativan    Discharge Instructions  Discharge Instructions    Diet - low sodium heart healthy    Complete by:  As directed  Increase activity slowly    Complete by:  As directed        Medication List    TAKE these medications   albuterol 108 (90 Base) MCG/ACT inhaler Commonly known as:  PROVENTIL HFA;VENTOLIN HFA Inhale 2 puffs into the  lungs every 4 (four) hours as needed for wheezing or shortness of breath.   albuterol (2.5 MG/3ML) 0.083% nebulizer solution Commonly known as:  PROVENTIL Take 3 mLs (2.5 mg total) by nebulization every 4 (four) hours as needed for wheezing or shortness of breath.   ARIPiprazole 5 MG tablet Commonly known as:  ABILIFY Take 5 mg by mouth daily.   cefUROXime 500 MG tablet Commonly known as:  CEFTIN Take 1 tablet (500 mg total) by mouth 2 (two) times daily with a meal.   citalopram 20 MG tablet Commonly known as:  CELEXA Take 20 mg by mouth daily.   esomeprazole 20 MG capsule Commonly known as:  NEXIUM Take 40 mg by mouth daily.   glyBURIDE 2.5 MG tablet Commonly known as:  DIABETA Take 2.5 mg by mouth daily. What changed:  Another medication with the same name was removed. Continue taking this medication, and follow the directions you see here.   LORazepam 1 MG tablet Commonly known as:  ATIVAN Take 1 mg by mouth at bedtime.   metroNIDAZOLE 500 MG tablet Commonly known as:  FLAGYL Take 1 tablet (500 mg total) by mouth 2 (two) times daily.   oxyCODONE 10 mg 12 hr tablet Commonly known as:  OXYCONTIN Take 10 mg by mouth 2 (two) times daily as needed (for pain).   spironolactone 50 MG tablet Commonly known as:  ALDACTONE Take 100 mg by mouth daily.   tiZANidine 4 MG tablet Commonly known as:  ZANAFLEX Take 4 mg by mouth at bedtime as needed for muscle spasms.       No Known Allergies  Consultations: Gen Surgery  Procedures (Echo, Carotid, EGD, Colonoscopy, ERCP)   Radiological studies: US Abdomen Complete  Result Date: 04/02/2016 CLINICAL DATA:  Moderate constant right upper quadrant pain since 04/01/2016. History of liver disease, jaundice, cirrhosis, hypertension, diabetes, mononucleosis, increased spleen size. EXAM: ABDOMEN ULTRASOUND COMPLETE COMPARISON:  CT abdomen and pelvis 02/28/2016 FINDINGS: Gallbladder: Small stones in the dependent gallbladder.  Mild gallbladder edema with wall thickness at the upper limits of normal, 3 mm. Layering sludge in the gallbladder. Murphy's sign is positive. Common bile duct: Diameter: 3 mm, normal Liver: Diffusely increased and heterogeneous liver parenchymal echotexture with nodular contour suggesting changes of cirrhosis. No focal liver lesions identified. IVC: No abnormality visualized. Pancreas: Limited visualization due to overlying bowel gas. Spleen: Spleen is enlarged, measuring 17.95 20.55 9 cm, volume 1723 mL. No focal lesions identified. Right Kidney: Length: 13.4 cm. Echogenicity within normal limits. No mass or hydronephrosis visualized. Left Kidney: Length: 13.2 cm. Echogenicity within normal limits. No mass or hydronephrosis visualized. Abdominal aorta: No aneurysm visualized. Other findings: Moderate abdominal ascites. IMPRESSION: Changes consistent with hepatic cirrhosis with splenic enlargement and moderate abdominal ascites. Cholelithiasis with positive Murphy's sign and pericholecystic edema. Changes nonspecific but are compatible with acute cholecystitis in the appropriate clinical setting. Electronically Signed   By: Burman Nieves M.D.   On: 04/02/2016 03:27   US Abdomen Limited  Result Date: 04/02/2016 CLINICAL DATA:  Abdominal distention.  Hepatic cirrhosis EXAM: LIMITED ABDOMEN ULTRASOUND FOR ASCITES TECHNIQUE: Limited ultrasound survey for ascites was performed in all four abdominal quadrants. COMPARISON:  CT abdomen and pelvis February 28, 2016 FINDINGS:  Mild ascites is noted throughout the abdomen. There is no large collection of ascites present. The greatest degree of ascites is noted between the right hemidiaphragm and liver. Liver has an appearance consistent with cirrhosis. IMPRESSION: Mild ascites present.  Hepatic cirrhosis noted. Electronically Signed   By: Bretta BangWilliam  Woodruff III M.D.   On: 04/02/2016 15:54     Subjective:  Discharge Exam: Vitals:   04/02/16 1642 04/02/16 1949  04/02/16 2300 04/03/16 0509  BP: 104/62 (!) 106/46 (!) 110/56 (!) 105/52  Pulse: 72 79 82 89  Resp: 16 16 16 16   Temp:  98.3 F (36.8 C) 98.8 F (37.1 C) 98.7 F (37.1 C)  TempSrc:  Oral Oral Oral  SpO2: 99% 100% 99% 94%  Weight:    73.4 kg (161 lb 14.4 oz)   General: Pt is alert, awake, not in acute distress Cardiovascular: RRR, S1/S2 +, no rubs, no gallops Respiratory: CTA bilaterally, no wheezing, no rhonchi Abdominal: Soft, NT, ND, bowel sounds + Extremities: no edema, no cyanosis   The results of significant diagnostics from this hospitalization (including imaging, microbiology, ancillary and laboratory) are listed below for reference.    Microbiology: Recent Results (from the past 240 hour(s))  Urine culture     Status: Abnormal (Preliminary result)   Collection Time: 04/01/16 10:28 PM  Result Value Ref Range Status   Specimen Description URINE, RANDOM  Final   Special Requests NONE  Final   Culture >=100,000 COLONIES/mL ENTEROCOCCUS FAECALIS (A)  Final   Report Status PENDING  Incomplete     Labs: BNP (last 3 results) No results for input(s): BNP in the last 8760 hours. Basic Metabolic Panel:  Recent Labs Lab 04/01/16 2215 04/03/16 0300  NA 128* 135  K 4.2 3.9  CL 98* 103  CO2 23 27  GLUCOSE 156* 144*  BUN 9 6  CREATININE 0.74 0.86  CALCIUM 8.5* 8.4*   Liver Function Tests:  Recent Labs Lab 04/01/16 2215 04/03/16 0300  AST 39 37  ALT 15 15  ALKPHOS 178* 138*  BILITOT 8.6* 9.3*  PROT 5.9* 5.3*  ALBUMIN 2.5* 2.4*    Recent Labs Lab 04/01/16 2215  LIPASE 59*   No results for input(s): AMMONIA in the last 168 hours. CBC:  Recent Labs Lab 04/01/16 2215 04/03/16 0300  WBC 4.9 4.5  HGB 8.6* 8.0*  HCT 26.6* 24.3*  MCV 91.4 91.0  PLT 71* 68*   Cardiac Enzymes: No results for input(s): CKTOTAL, CKMB, CKMBINDEX, TROPONINI in the last 168 hours. BNP: Invalid input(s): POCBNP CBG:  Recent Labs Lab 04/02/16 0627 04/03/16 0506   GLUCAP 129* 161*   D-Dimer No results for input(s): DDIMER in the last 72 hours. Hgb A1c No results for input(s): HGBA1C in the last 72 hours. Lipid Profile No results for input(s): CHOL, HDL, LDLCALC, TRIG, CHOLHDL, LDLDIRECT in the last 72 hours. Thyroid function studies No results for input(s): TSH, T4TOTAL, T3FREE, THYROIDAB in the last 72 hours.  Invalid input(s): FREET3 Anemia work up No results for input(s): VITAMINB12, FOLATE, FERRITIN, TIBC, IRON, RETICCTPCT in the last 72 hours. Urinalysis    Component Value Date/Time   COLORURINE ORANGE (A) 04/01/2016 2228   APPEARANCEUR CLOUDY (A) 04/01/2016 2228   LABSPEC 1.015 04/01/2016 2228   PHURINE 6.0 04/01/2016 2228   GLUCOSEU NEGATIVE 04/01/2016 2228   HGBUR NEGATIVE 04/01/2016 2228   BILIRUBINUR MODERATE (A) 04/01/2016 2228   KETONESUR NEGATIVE 04/01/2016 2228   PROTEINUR NEGATIVE 04/01/2016 2228   UROBILINOGEN 1.0 06/07/2011 1303  NITRITE NEGATIVE 04/01/2016 2228   LEUKOCYTESUR LARGE (A) 04/01/2016 2228   Sepsis Labs Invalid input(s): PROCALCITONIN,  WBC,  LACTICIDVEN Microbiology Recent Results (from the past 240 hour(s))  Urine culture     Status: Abnormal (Preliminary result)   Collection Time: 04/01/16 10:28 PM  Result Value Ref Range Status   Specimen Description URINE, RANDOM  Final   Special Requests NONE  Final   Culture >=100,000 COLONIES/mL ENTEROCOCCUS FAECALIS (A)  Final   Report Status PENDING  Incomplete     Time coordinating discharge: Over 30 minutes  SIGNED:   Clint LippsELMAHI,Alexzandrea Normington A, MD  [facility] 04/03/2016, 10:11 AM Pager   If 7PM-7AM, please contact night-coverage [email or web address] Password [email or web address]

## 2016-04-03 NOTE — Final Consult Note (Signed)
Consultant Final Sign-Off Note    Assessment/Final recommendations  Katrina Cohen is a 50 y.o. female followed by me for abdominal pain. This pain does not appear to be due to acute cholecystitis. She is high risk for any procedure and feels better after 24h abx   Wound care (if applicable):    Diet at discharge: per primary team   Activity at discharge: per primary team   Follow-up appointment:  [facility]   [facility]:  [facility]    Start     Ordered   04/04/16 0500  Procalcitonin  Every 48 hours,   R     04/02/16 0506   04/02/16 0147  Urine culture  STAT,   STAT     04/02/16 0147       Medication recommendations:   Other recommendations:    Thank you for allowing us to participate in the care of your patient!  Please consult us again if you have further needs for your patient.  De BlanchLuke Aaron Kinsinger 04/03/2016 7:42 AM    Subjective     Objective  Vital signs in last 24 hours: Temp:  [98.3 F (36.8 C)-99.3 F (37.4 C)] 98.7 F (37.1 C) (11/11 0509) Pulse Rate:  [71-89] 89 (11/11 0509) Resp:  [16] 16 (11/11 0509) BP: (99-110)/(46-62) 105/52 (11/11 0509) SpO2:  [94 %-100 %] 94 % (11/11 0509) Weight:  [73.4 kg (161 lb 14.4 oz)] 73.4 kg (161 lb 14.4 oz) (11/11 0509)  General: NAD Ab: soft, NT, mod distension   Pertinent labs and Studies:  Recent Labs  04/01/16 2215 04/03/16 0300  WBC 4.9 4.5  HGB 8.6* 8.0*  HCT 26.6* 24.3*   BMET  Recent Labs  04/01/16 2215 04/03/16 0300  NA 128* 135  K 4.2 3.9  CL 98* 103  CO2 23 27  GLUCOSE 156* 144*  BUN 9 6  CREATININE 0.74 0.86  CALCIUM 8.5* 8.4*   No results for input(s): LABURIN in the last 72 hours. Results for orders placed or performed during the hospital encounter of 01/30/16  Urine culture     Status: Abnormal   Collection Time: 01/30/16 10:24 PM  Result Value Ref Range Status   Specimen Description URINE, RANDOM  Final   Special Requests NONE  Final   Culture >=100,000 COLONIES/mL HAFNIA ALVEI (A)  Final   Report Status 02/02/2016 FINAL  Final   Organism ID, Bacteria HAFNIA ALVEI (A)  Final      Susceptibility   Hafnia alvei - MIC*    AMPICILLIN 8 RESISTANT Resistant     CEFAZOLIN >=64 RESISTANT Resistant     CEFTRIAXONE <=1 SENSITIVE Sensitive     CIPROFLOXACIN <=0.25 SENSITIVE Sensitive     GENTAMICIN <=1 SENSITIVE Sensitive     IMIPENEM 0.5 SENSITIVE Sensitive     NITROFURANTOIN <=16 SENSITIVE Sensitive     TRIMETH/SULFA <=20 SENSITIVE Sensitive     AMPICILLIN/SULBACTAM 4 RESISTANT Resistant     PIP/TAZO 16 SENSITIVE Sensitive     * >=100,000 COLONIES/mL HAFNIA ALVEI    Imaging: [place] Abdomen Limited  Result Date: 04/02/2016 CLINICAL DATA:  Abdominal distention.  Hepatic cirrhosis EXAM: LIMITED ABDOMEN ULTRASOUND FOR ASCITES TECHNIQUE: Limited ultrasound survey for ascites was performed in all four abdominal quadrants. COMPARISON:  CT abdomen and pelvis February 28, 2016 FINDINGS: Mild ascites is noted throughout the abdomen. There is no large collection of ascites present. The greatest degree of ascites is noted between the right hemidiaphragm and liver. Liver has  an appearance consistent with cirrhosis. IMPRESSION: Mild ascites present.  Hepatic cirrhosis noted. Electronically Signed   By: Bretta BangWilliam  Woodruff III M.D.   On: 04/02/2016 15:54

## 2016-04-04 LAB — URINE CULTURE: Culture: >=100000 — AB

## 2016-04-13 ENCOUNTER — Other Ambulatory Visit (HOSPITAL_COMMUNITY): Payer: Self-pay | Admitting: Internal Medicine

## 2016-04-13 DIAGNOSIS — R188 Other ascites: Secondary | ICD-10-CM

## 2016-04-13 DIAGNOSIS — K7469 Other cirrhosis of liver: Secondary | ICD-10-CM

## 2016-04-19 ENCOUNTER — Emergency Department (HOSPITAL_COMMUNITY)
Admission: EM | Admit: 2016-04-19 | Discharge: 2016-04-20 | Disposition: A | Discharge status: Home / Self Care | Payer: Medicaid Other | Attending: Emergency Medicine | Admitting: Emergency Medicine

## 2016-04-19 DIAGNOSIS — J449 Chronic obstructive pulmonary disease, unspecified: Secondary | ICD-10-CM | POA: Diagnosis not present

## 2016-04-19 DIAGNOSIS — I10 Essential (primary) hypertension: Secondary | ICD-10-CM | POA: Insufficient documentation

## 2016-04-19 DIAGNOSIS — K8020 Calculus of gallbladder without cholecystitis without obstruction: Secondary | ICD-10-CM

## 2016-04-19 DIAGNOSIS — E119 Type 2 diabetes mellitus without complications: Secondary | ICD-10-CM | POA: Diagnosis not present

## 2016-04-19 DIAGNOSIS — R1011 Right upper quadrant pain: Secondary | ICD-10-CM

## 2016-04-19 DIAGNOSIS — F17210 Nicotine dependence, cigarettes, uncomplicated: Secondary | ICD-10-CM | POA: Insufficient documentation

## 2016-04-19 LAB — CBC
HCT: 25.7 % — ABNORMAL LOW (ref 36.0–46.0)
Hemoglobin: 8.4 g/dL — ABNORMAL LOW (ref 12.0–15.0)
MCH: 30 pg (ref 26.0–34.0)
MCHC: 32.7 g/dL (ref 30.0–36.0)
MCV: 91.8 fL (ref 78.0–100.0)
PLATELETS: 75 10*3/uL — AB (ref 150–400)
RBC: 2.8 MIL/uL — AB (ref 3.87–5.11)
RDW: 16.1 % — ABNORMAL HIGH (ref 11.5–15.5)
WBC: 4.3 10*3/uL (ref 4.0–10.5)

## 2016-04-19 LAB — COMPREHENSIVE METABOLIC PANEL
ALBUMIN: 2.5 g/dL — AB (ref 3.5–5.0)
ALK PHOS: 157 U/L — AB (ref 38–126)
ALT: 17 U/L (ref 14–54)
AST: 39 U/L (ref 15–41)
Anion gap: 6 (ref 5–15)
BUN: 10 mg/dL (ref 6–20)
CALCIUM: 8.2 mg/dL — AB (ref 8.9–10.3)
CHLORIDE: 98 mmol/L — AB (ref 101–111)
CO2: 24 mmol/L (ref 22–32)
CREATININE: 0.66 mg/dL (ref 0.44–1.00)
GFR calc non Af Amer: >60 mL/min (ref >60)
GLUCOSE: 197 mg/dL — AB (ref 65–99)
Potassium: 3.8 mmol/L (ref 3.5–5.1)
SODIUM: 128 mmol/L — AB (ref 135–145)
Total Bilirubin: 12.3 mg/dL — ABNORMAL HIGH (ref 0.3–1.2)
Total Protein: 5.9 g/dL — ABNORMAL LOW (ref 6.5–8.1)

## 2016-04-19 LAB — LIPASE, BLOOD: LIPASE: 48 U/L (ref 11–51)

## 2016-04-19 NOTE — ED Triage Notes (Signed)
Pt called from triage x 1, no answer

## 2016-04-19 NOTE — ED Triage Notes (Signed)
Pt states that she has had RUQ pain x 2-3 weeks and was told that she needs her gallbladder out but [facility] because it is a risky surgery because she has advanced liver disease. Alert and oriented.

## 2016-04-20 ENCOUNTER — Ambulatory Visit (HOSPITAL_COMMUNITY)
Admission: RE | Admit: 2016-04-20 | Discharge: 2016-04-20 | Disposition: A | Discharge status: Home / Self Care | Payer: Medicaid Other | Source: Ambulatory Visit | Attending: Interventional Radiology | Admitting: Interventional Radiology

## 2016-04-20 ENCOUNTER — Other Ambulatory Visit (HOSPITAL_COMMUNITY): Payer: Self-pay | Admitting: Internal Medicine

## 2016-04-20 ENCOUNTER — Encounter (HOSPITAL_COMMUNITY): Payer: Self-pay | Admitting: Emergency Medicine

## 2016-04-20 ENCOUNTER — Emergency Department (HOSPITAL_COMMUNITY): Payer: Medicaid Other

## 2016-04-20 DIAGNOSIS — R188 Other ascites: Secondary | ICD-10-CM

## 2016-04-20 DIAGNOSIS — K7469 Other cirrhosis of liver: Secondary | ICD-10-CM

## 2016-04-20 DIAGNOSIS — K7031 Alcoholic cirrhosis of liver with ascites: Secondary | ICD-10-CM | POA: Diagnosis present

## 2016-04-20 HISTORY — PX: IR GENERIC HISTORICAL: IMG1180011

## 2016-04-20 LAB — BODY FLUID CELL COUNT WITH DIFFERENTIAL
EOS FL: 0 %
Lymphs, Fluid: 34 %
Monocyte-Macrophage-Serous Fluid: 60 % (ref 50–90)
Neutrophil Count, Fluid: 6 % (ref 0–25)
WBC FLUID: 215 uL (ref 0–1000)

## 2016-04-20 LAB — URINALYSIS, ROUTINE W REFLEX MICROSCOPIC
Glucose, UA: NEGATIVE mg/dL
Hgb urine dipstick: NEGATIVE
KETONES UR: NEGATIVE mg/dL
NITRITE: NEGATIVE
Protein, ur: NEGATIVE mg/dL
Specific Gravity, Urine: 1.007 (ref 1.005–1.030)
pH: 6 (ref 5.0–8.0)

## 2016-04-20 LAB — GRAM STAIN

## 2016-04-20 LAB — URINE MICROSCOPIC-ADD ON: RBC / HPF: NONE SEEN RBC/hpf (ref 0–5)

## 2016-04-20 LAB — PROTIME-INR
INR: 1.95
PROTHROMBIN TIME: 22.5 s — AB (ref 11.4–15.2)

## 2016-04-20 MED ORDER — LIDOCAINE HCL 1 % IJ SOLN
INTRAMUSCULAR | Status: DC | PRN
  Administered 2016-04-20: 5 mL

## 2016-04-20 MED ORDER — MORPHINE SULFATE (PF) 4 MG/ML IV SOLN
6.0000 mg | Freq: Once | INTRAVENOUS | Status: AC
  Administered 2016-04-20: 6 mg via INTRAVENOUS
  Filled 2016-04-20: qty 2

## 2016-04-20 MED ORDER — LIDOCAINE HCL 1 % IJ SOLN
INTRAMUSCULAR | Status: AC
  Filled 2016-04-20: qty 20

## 2016-04-20 NOTE — ED Provider Notes (Signed)
[facility]-EMERGENCY DEPT Provider Note   CSN: [id number] Arrival date & time: 04/19/16  2029   By signing my name below, I, Clarisse GougeXavier Herndon, attest that this documentation has been prepared under the direction and in the presence of Tomasita CrumbleAdeleke Hannahmarie Asberry, MD. Electronically signed, Clarisse GougeXavier Herndon, ED Scribe. 04/20/16. 12:33 AM.   History   Chief Complaint Chief Complaint  Patient presents with  . Abdominal Pain   The history is provided by the patient. No language interpreter was used.   HPI Comments: Katrina Cohen is a 50 y.o. female with PMHx of liver cirrhosis who presents to the Emergency Department complaining of off and on RUQ pain x 1 week. She states that she took a course of antibiotics beginning 4-5 days prior to onset of painful episodes. Pt reports associated abdominal pain worsened with eating, abdominal distension, jaundice, cough, and fatigue. She states that she has paracentesis scheduled for later today. [facility], currently on transplant list. Pt denies nausea, diarrhea, vomiting, fever, chills and diaphoresis.     Past Medical History:  Diagnosis Date  . Bipolar disorder (HCC) 1980s   with depression, anxiety  . Cirrhosis (HCC)   . Depression with anxiety 1980s   [facility].    . Diabetes mellitus   . Dysphagia 11/2014   normal esophagram 11/2014. EGD per Dr Dulce Sellarutlaw.   . Hypertension   . Jaundice 04/2015   In setting of infectious mononucleosis. Ultrasound in [place]: hepatosplenomegaly, portal htn, possible cirrhosis  . Liver disease   . Mononucleosis 04/15/2015    Patient Active Problem List   Diagnosis Date Noted  . Right upper quadrant abdominal pain 04/02/2016  . UTI (urinary tract infection) 04/02/2016  . Cholelithiasis 04/02/2016  . Depression with anxiety 04/02/2016  . Chronic pain 04/02/2016  . SOB (shortness of breath) 11/12/2015  . COPD exacerbation (HCC) 11/12/2015  . Acute respiratory failure with hypoxia  (HCC) 11/12/2015  . CAP (community acquired pneumonia) 11/07/2015  . Cirrhosis (HCC) 11/07/2015  . Thrombocytopenia (HCC) 11/07/2015  . Alcohol abuse 04/30/2015  . Acute hepatitis 04/30/2015  . Tobacco abuse 04/30/2015  . Malnutrition of moderate degree 04/29/2015  . Hypokalemia 04/29/2015  . Anemia 04/29/2015  . Hyponatremia 04/29/2015  . Coagulopathy (HCC) 04/29/2015  . Hypoalbuminemia 04/29/2015  . Mononucleosis 04/28/2015  . Vitamin D deficiency 06/11/2011  . Major depression (HCC) 06/10/2011    Past Surgical History:  Procedure Laterality Date  . APPENDECTOMY    . CESAREAN SECTION    . COLONOSCOPY    . ESOPHAGOGASTRODUODENOSCOPY     Dr Dulce Sellarutlaw ? 2016.     OB History    No data available       Home Medications    Prior to Admission medications   Medication Sig Start Date End Date Taking? Authorizing Provider  albuterol (PROVENTIL HFA;VENTOLIN HFA) 108 (90 Base) MCG/ACT inhaler Inhale 2 puffs into the lungs every 4 (four) hours as needed for wheezing or shortness of breath.  09/02/15  Yes Historical Provider, MD  albuterol (PROVENTIL) (2.5 MG/3ML) 0.083% nebulizer solution Take 3 mLs (2.5 mg total) by nebulization every 4 (four) hours as needed for wheezing or shortness of breath. 11/13/15  Yes Standley Brookinganiel P Goodrich, MD  ARIPiprazole (ABILIFY) 5 MG tablet Take 5 mg by mouth at bedtime.  04/02/15  Yes Historical Provider, MD  citalopram (CELEXA) 20 MG tablet Take 20 mg by mouth at bedtime.    Yes Historical Provider, MD  esomeprazole (NEXIUM)  20 MG capsule Take 40 mg by mouth daily.   Yes Historical Provider, MD  glyBURIDE (DIABETA) 2.5 MG tablet Take 2.5 mg by mouth daily. 03/04/16 03/04/17 Yes Historical Provider, MD  LORazepam (ATIVAN) 1 MG tablet Take 1 mg by mouth at bedtime.    Yes Historical Provider, MD  oxyCODONE (OXYCONTIN) 10 mg 12 hr tablet Take 10 mg by mouth 2 (two) times daily as needed (for pain).    Yes Historical Provider, MD  spironolactone (ALDACTONE) 50 MG  tablet Take 100 mg by mouth daily with breakfast.    Yes Historical Provider, MD  tiZANidine (ZANAFLEX) 4 MG tablet Take 4 mg by mouth at bedtime as needed for muscle spasms.  01/10/16  Yes Historical Provider, MD  cefUROXime (CEFTIN) 500 MG tablet Take 1 tablet (500 mg total) by mouth 2 (two) times daily with a meal. Patient not taking: Reported on 04/20/2016 04/03/16   Clydia Llano, MD  metroNIDAZOLE (FLAGYL) 500 MG tablet Take 1 tablet (500 mg total) by mouth 2 (two) times daily. Patient not taking: Reported on 04/20/2016 02/28/16   Lorre Nick, MD    Family History Family History  Problem Relation Age of Onset  . Diabetes Mother   . Cancer Father   . Diabetes Sister   . Diabetes Brother     Social History Social History  Substance Use Topics  . Smoking status: Current Every Day Smoker    Packs/day: 0.00    Types: Cigarettes  . Smokeless tobacco: Never Used  . Alcohol use Yes     Comment: occasionally     Allergies   Patient has no known allergies.   Review of Systems Review of Systems 10 Systems reviewed and all are negative for acute change except as noted in the HPI.    Physical Exam Updated Vital Signs BP 97/56 (BP Location: Left Arm)   Pulse 77   Temp 98.4 F (36.9 C) (Oral)   Resp 22   Ht 5\' 4"  (1.626 m)   Wt 170 lb (77.1 kg)   LMP  (Approximate)   SpO2 92%   BMI 29.18 kg/m   Physical Exam  Constitutional: She is oriented to person, place, and time. She appears well-developed and well-nourished. No distress.  HENT:  Head: Normocephalic and atraumatic.  Nose: Nose normal.  Mouth/Throat: No oropharyngeal exudate.  Oral jaundice.  Eyes: Conjunctivae and EOM are normal. Pupils are equal, round, and reactive to light. Scleral icterus is present.  Neck: Normal range of motion. Neck supple. No JVD present. No tracheal deviation present. No thyromegaly present.  Cardiovascular: Normal rate, regular rhythm and normal heart sounds.  Exam reveals no gallop  and no friction rub.   No murmur heard. Bilateral lower extremity edema.  Pulmonary/Chest: Effort normal and breath sounds normal. No respiratory distress. She has no wheezes. She exhibits no tenderness.  Intermittent expiratory wheezing.  Abdominal: Soft. Bowel sounds are normal. She exhibits no distension and no mass. There is no tenderness. There is no rebound and no guarding.  RUQ TTP. Positive murphy's sign. Distended abdomen. Ascites.  Musculoskeletal: Normal range of motion. She exhibits no edema or tenderness.  Lymphadenopathy:    She has no cervical adenopathy.  Neurological: She is alert and oriented to person, place, and time. No cranial nerve deficit. She exhibits normal muscle tone.  Skin: Skin is warm and dry. No rash noted. No erythema. No pallor.  Jaundice.  Nursing note and vitals reviewed.    ED Treatments / Results  DIAGNOSTIC STUDIES: Oxygen Saturation is 94% on RA, adequate by my interpretation.    COORDINATION OF CARE: 12:34 AM Will order fluids, imaging and pain medications. Discussed treatment plan with pt at bedside and pt agreed to plan.  Labs (all labs ordered are listed, but only abnormal results are displayed) Labs Reviewed  COMPREHENSIVE METABOLIC PANEL - Abnormal; Notable for the following:       Result Value   Sodium 128 (*)    Chloride 98 (*)    Glucose, Bld 197 (*)    Calcium 8.2 (*)    Total Protein 5.9 (*)    Albumin 2.5 (*)    Alkaline Phosphatase 157 (*)    Total Bilirubin 12.3 (*)    All other components within normal limits  CBC - Abnormal; Notable for the following:    RBC 2.80 (*)    Hemoglobin 8.4 (*)    HCT 25.7 (*)    RDW 16.1 (*)    Platelets 75 (*)    All other components within normal limits  URINALYSIS, ROUTINE W REFLEX MICROSCOPIC (NOT AT [facility]) - Abnormal; Notable for the following:    APPearance TURBID (*)    Bilirubin Urine SMALL (*)    Leukocytes, UA TRACE (*)    All other components within normal limits    PROTIME-INR - Abnormal; Notable for the following:    Prothrombin Time 22.5 (*)    All other components within normal limits  URINE MICROSCOPIC-ADD ON - Abnormal; Notable for the following:    Squamous Epithelial / LPF 6-30 (*)    Bacteria, UA MANY (*)    All other components within normal limits  LIPASE, BLOOD    EKG  EKG Interpretation None       Radiology [place] Abdomen Limited Ruq  Result Date: 04/20/2016 CLINICAL DATA:  Right upper quadrant abdominal pain EXAM: US ABDOMEN LIMITED - RIGHT UPPER QUADRANT COMPARISON:  Abdominal ultrasound 04/02/2016 FINDINGS: Gallbladder: There are multiple gallstones measuring up to 7 mm, in addition to small, mobile gravel -like stones. No positive sonographic Eulah PontMurphy sign was demonstrated. No pericholecystic fluid or wall thickening. Common bile duct: Diameter: 5 mm, normal Liver: There is increased hepatic echogenicity with surrounding ascites. IMPRESSION: 1. Cholelithiasis without other evidence of acute cholecystitis. 2. Hyperechoic appearance of the liver with surrounding ascites is compatible with reported cirrhosis. Electronically Signed   By: Deatra RobinsonKevin  Herman M.D.   On: 04/20/2016 02:36    Procedures Procedures (including critical care time)  Medications Ordered in ED Medications  morphine 4 MG/ML injection 6 mg (6 mg Intravenous Given 04/20/16 0142)     Initial Impression / Assessment and Plan / ED Course  I have reviewed the triage vital signs and the nursing notes.  Pertinent labs & imaging results that were available during my care of the patient were reviewed by me and considered in my medical decision making (see chart for details).  Clinical Course     Patient presents to the ED for worsening RUQ pain after eating.  Her last US showed concern for cholecystitis but she was treated with abx.  Bilirubin is now worse.  Plan to obtain repeat US to eval for worsening. She was given morphine for pain control.  3:09 AM US reveals  cholelithiasis but no cholecystitis.  Patient made aware of diagnosis.  Encouraged to take her home pain medication as needed.  [facility].  She demonstrates good understanding.  Pain is well  controlled.  She appears well and in NAD.  Vs remain within her normal limits and she issafe for DC.  Final Clinical Impressions(s) / ED Diagnoses   Final diagnoses:  RUQ abdominal pain  Calculus of gallbladder without cholecystitis without obstruction    New Prescriptions New Prescriptions   No medications on file     I personally performed the services described in this documentation, which was scribed in my presence. The recorded information has been reviewed and is accurate.      Tomasita Crumble, MD 04/20/16 [phone]

## 2016-04-20 NOTE — Procedures (Addendum)
Ultrasound-guided diagnostic and therapeutic paracentesis performed yielding 1.9 liters of slightly hazy, yellow  fluid. No immediate complications. A portion of the fluid was sent to the lab for cytology, cell count and culture.

## 2016-04-25 LAB — CULTURE, BODY FLUID-BOTTLE

## 2016-04-25 LAB — CULTURE, BODY FLUID W GRAM STAIN -BOTTLE: Culture: NO GROWTH

## 2016-07-07 ENCOUNTER — Other Ambulatory Visit (HOSPITAL_COMMUNITY): Payer: Self-pay | Admitting: Physician Assistant

## 2016-07-07 DIAGNOSIS — K7030 Alcoholic cirrhosis of liver without ascites: Secondary | ICD-10-CM

## 2016-07-12 ENCOUNTER — Emergency Department (HOSPITAL_COMMUNITY)
Admission: EM | Admit: 2016-07-12 | Discharge: 2016-07-12 | Disposition: A | Discharge status: Home / Self Care | Payer: Medicaid Other | Attending: Emergency Medicine | Admitting: Emergency Medicine

## 2016-07-12 ENCOUNTER — Emergency Department (HOSPITAL_COMMUNITY): Payer: Medicaid Other

## 2016-07-12 ENCOUNTER — Encounter (HOSPITAL_COMMUNITY): Payer: Self-pay

## 2016-07-12 DIAGNOSIS — N390 Urinary tract infection, site not specified: Secondary | ICD-10-CM | POA: Diagnosis not present

## 2016-07-12 DIAGNOSIS — E119 Type 2 diabetes mellitus without complications: Secondary | ICD-10-CM | POA: Insufficient documentation

## 2016-07-12 DIAGNOSIS — J449 Chronic obstructive pulmonary disease, unspecified: Secondary | ICD-10-CM | POA: Diagnosis not present

## 2016-07-12 DIAGNOSIS — Z79899 Other long term (current) drug therapy: Secondary | ICD-10-CM | POA: Diagnosis not present

## 2016-07-12 DIAGNOSIS — I10 Essential (primary) hypertension: Secondary | ICD-10-CM | POA: Insufficient documentation

## 2016-07-12 DIAGNOSIS — R05 Cough: Secondary | ICD-10-CM | POA: Diagnosis present

## 2016-07-12 DIAGNOSIS — J90 Pleural effusion, not elsewhere classified: Secondary | ICD-10-CM | POA: Insufficient documentation

## 2016-07-12 DIAGNOSIS — R791 Abnormal coagulation profile: Secondary | ICD-10-CM | POA: Insufficient documentation

## 2016-07-12 DIAGNOSIS — F17210 Nicotine dependence, cigarettes, uncomplicated: Secondary | ICD-10-CM | POA: Insufficient documentation

## 2016-07-12 LAB — URINALYSIS, ROUTINE W REFLEX MICROSCOPIC
Glucose, UA: 50 mg/dL — AB
Ketones, ur: NEGATIVE mg/dL
Nitrite: POSITIVE — AB
PROTEIN: 30 mg/dL — AB
Specific Gravity, Urine: 1.019 (ref 1.005–1.030)
pH: 5 (ref 5.0–8.0)

## 2016-07-12 LAB — COMPREHENSIVE METABOLIC PANEL
ALK PHOS: 174 U/L — AB (ref 38–126)
ALT: 16 U/L (ref 14–54)
AST: 40 U/L (ref 15–41)
Albumin: 2.8 g/dL — ABNORMAL LOW (ref 3.5–5.0)
Anion gap: 5 (ref 5–15)
BILIRUBIN TOTAL: 6 mg/dL — AB (ref 0.3–1.2)
BUN: 7 mg/dL (ref 6–20)
CALCIUM: 8.3 mg/dL — AB (ref 8.9–10.3)
CO2: 26 mmol/L (ref 22–32)
CREATININE: 0.69 mg/dL (ref 0.44–1.00)
Chloride: 103 mmol/L (ref 101–111)
GFR calc Af Amer: >60 mL/min (ref >60)
GFR calc non Af Amer: >60 mL/min (ref >60)
Glucose, Bld: 187 mg/dL — ABNORMAL HIGH (ref 65–99)
Potassium: 3.6 mmol/L (ref 3.5–5.1)
SODIUM: 134 mmol/L — AB (ref 135–145)
TOTAL PROTEIN: 6.9 g/dL (ref 6.5–8.1)

## 2016-07-12 LAB — CBC WITH DIFFERENTIAL/PLATELET
BASOS PCT: 1 %
Basophils Absolute: 0 10*3/uL (ref 0.0–0.1)
EOS ABS: 0.2 10*3/uL (ref 0.0–0.7)
Eosinophils Relative: 3 %
HCT: 30.9 % — ABNORMAL LOW (ref 36.0–46.0)
HEMOGLOBIN: 9.9 g/dL — AB (ref 12.0–15.0)
Lymphocytes Relative: 22 %
Lymphs Abs: 1 10*3/uL (ref 0.7–4.0)
MCH: 28 pg (ref 26.0–34.0)
MCHC: 32 g/dL (ref 30.0–36.0)
MCV: 87.5 fL (ref 78.0–100.0)
MONO ABS: 0.4 10*3/uL (ref 0.1–1.0)
MONOS PCT: 10 %
NEUTROS PCT: 64 %
Neutro Abs: 2.8 10*3/uL (ref 1.7–7.7)
Platelets: 94 10*3/uL — ABNORMAL LOW (ref 150–400)
RBC: 3.53 MIL/uL — ABNORMAL LOW (ref 3.87–5.11)
RDW: 17.4 % — AB (ref 11.5–15.5)
WBC: 4.4 10*3/uL (ref 4.0–10.5)

## 2016-07-12 LAB — PROTIME-INR
INR: 1.75
Prothrombin Time: 20.6 seconds — ABNORMAL HIGH (ref 11.4–15.2)

## 2016-07-12 LAB — INFLUENZA PANEL BY PCR (TYPE A & B)
INFLAPCR: NEGATIVE
Influenza B By PCR: NEGATIVE

## 2016-07-12 LAB — I-STAT CG4 LACTIC ACID, ED: LACTIC ACID, VENOUS: 1.03 mmol/L (ref 0.5–1.9)

## 2016-07-12 LAB — APTT: APTT: 41 s — AB (ref 24–36)

## 2016-07-12 MED ORDER — DEXTROSE 5 % IV SOLN
1.0000 g | Freq: Once | INTRAVENOUS | Status: AC
  Administered 2016-07-12: 1 g via INTRAVENOUS
  Filled 2016-07-12: qty 10

## 2016-07-12 MED ORDER — CEPHALEXIN 500 MG PO CAPS: 500.0000 mg | ORAL_CAPSULE | Freq: Three times a day (TID) | ORAL | 0 refills | Status: AC

## 2016-07-12 NOTE — ED Triage Notes (Addendum)
Pt states that for over the last 4 days, she has been experiencing fatigue, cough, congestion, chills and low grade fever. She denies vomiting or diarrhea. She reports a compromised immune system d/t liver disease. A&Ox4. Ambulatory. Hx of pneumonia.

## 2016-07-12 NOTE — ED Provider Notes (Signed)
[facility]-EMERGENCY DEPT Provider Note   CSN: [id number] Arrival date & time: 07/12/16  1741     History   Chief Complaint Chief Complaint  Patient presents with  . Flu Like Symptoms    HPI Jory EeJennifer Huelsmann is a 51 y.o. female.  The history is provided by the patient.  Fever   This is a new problem. Episode onset: 3 days ago. The problem occurs constantly. The problem has not changed since onset.The maximum temperature noted was 99 to 99.9 F. Associated symptoms include muscle aches and cough (today). Pertinent negatives include no chest pain, no diarrhea and no vomiting. She has tried nothing for the symptoms.    Past Medical History:  Diagnosis Date  . Bipolar disorder (HCC) 1980s   with depression, anxiety  . Cirrhosis (HCC)   . Depression with anxiety 1980s   [facility]    . Diabetes mellitus   . Dysphagia 11/2014   normal esophagram 11/2014. EGD per Dr Dulce Sellarutlaw.   . Hypertension   . Jaundice 04/2015   In setting of infectious mononucleosis. Ultrasound in [place]: hepatosplenomegaly, portal htn, possible cirrhosis  . Liver disease   . Mononucleosis 04/15/2015    Patient Active Problem List   Diagnosis Date Noted  . Right upper quadrant abdominal pain 04/02/2016  . UTI (urinary tract infection) 04/02/2016  . Cholelithiasis 04/02/2016  . Depression with anxiety 04/02/2016  . Chronic pain 04/02/2016  . SOB (shortness of breath) 11/12/2015  . COPD exacerbation (HCC) 11/12/2015  . Acute respiratory failure with hypoxia (HCC) 11/12/2015  . CAP (community acquired pneumonia) 11/07/2015  . Cirrhosis (HCC) 11/07/2015  . Thrombocytopenia (HCC) 11/07/2015  . Alcohol abuse 04/30/2015  . Acute hepatitis 04/30/2015  . Tobacco abuse 04/30/2015  . Malnutrition of moderate degree 04/29/2015  . Hypokalemia 04/29/2015  . Anemia 04/29/2015  . Hyponatremia 04/29/2015  . Coagulopathy (HCC) 04/29/2015  . Hypoalbuminemia 04/29/2015  . Mononucleosis 04/28/2015  . Vitamin D  deficiency 06/11/2011  . Major depression (HCC) 06/10/2011    Past Surgical History:  Procedure Laterality Date  . APPENDECTOMY    . CESAREAN SECTION    . COLONOSCOPY    . ESOPHAGOGASTRODUODENOSCOPY     Dr Dulce Sellarutlaw ? 2016.   . IR GENERIC HISTORICAL  04/20/2016   IR PARACENTESIS 04/20/2016 Darrell K Allred, PA-C [facility]-INTERV RAD    OB History    No data available       Home Medications    Prior to Admission medications   Medication Sig Start Date End Date Taking? Authorizing Provider  albuterol (PROVENTIL HFA;VENTOLIN HFA) 108 (90 Base) MCG/ACT inhaler Inhale 2 puffs into the lungs every 4 (four) hours as needed for wheezing or shortness of breath.  09/02/15  Yes Historical Provider, MD  ARIPiprazole (ABILIFY) 5 MG tablet Take 5 mg by mouth at bedtime.  04/02/15  Yes Historical Provider, MD  buPROPion (WELLBUTRIN SR) 150 MG 12 hr tablet Take 150 mg by mouth 2 (two) times daily.  04/20/16 04/20/17 Yes Historical Provider, MD  citalopram (CELEXA) 20 MG tablet Take 20 mg by mouth at bedtime.    Yes Historical Provider, MD  glyBURIDE (DIABETA) 2.5 MG tablet Take 2.5 mg by mouth daily as needed (high blood sugar).  03/04/16 03/04/17 Yes Historical Provider, MD  LORazepam (ATIVAN) 1 MG tablet Take 1 mg by mouth at bedtime.    Yes Historical Provider, MD  oxyCODONE (OXYCONTIN) 10 mg 12 hr tablet Take 10 mg by mouth 2 (two) times daily as needed (  for pain).    Yes Historical Provider, MD  spironolactone (ALDACTONE) 50 MG tablet Take 100 mg by mouth daily with breakfast.    Yes Historical Provider, MD  albuterol (PROVENTIL) (2.5 MG/3ML) 0.083% nebulizer solution Take 3 mLs (2.5 mg total) by nebulization every 4 (four) hours as needed for wheezing or shortness of breath. 11/13/15   Standley Brooking, MD  cefUROXime (CEFTIN) 500 MG tablet Take 1 tablet (500 mg total) by mouth 2 (two) times daily with a meal. Patient not taking: Reported on 04/20/2016 04/03/16   Clydia Llano, MD  metroNIDAZOLE  (FLAGYL) 500 MG tablet Take 1 tablet (500 mg total) by mouth 2 (two) times daily. Patient not taking: Reported on 04/20/2016 02/28/16   Lorre Nick, MD  tiZANidine (ZANAFLEX) 4 MG tablet Take 4 mg by mouth at bedtime as needed for muscle spasms.  01/10/16   Historical Provider, MD    Family History Family History  Problem Relation Age of Onset  . Diabetes Mother   . Cancer Father   . Diabetes Sister   . Diabetes Brother     Social History Social History  Substance Use Topics  . Smoking status: Current Every Day Smoker    Packs/day: 0.00    Types: Cigarettes  . Smokeless tobacco: Never Used  . Alcohol use Yes     Comment: occasionally     Allergies   Patient has no known allergies.   Review of Systems Review of Systems  Constitutional: Positive for fever.  Respiratory: Positive for cough (today).   Cardiovascular: Negative for chest pain.  Gastrointestinal: Negative for diarrhea and vomiting.  All other systems reviewed and are negative.    Physical Exam Updated Vital Signs BP 114/79 (BP Location: Left Arm)   Pulse 81   Temp 98.8 F (37.1 C) (Oral)   Resp 16   Ht 5\' 4"  (1.626 m)   Wt 165 lb 5 oz (75 kg)   LMP 01/29/2013   SpO2 98%   BMI 28.38 kg/m   Physical Exam  Constitutional: She is oriented to person, place, and time. She appears well-developed and well-nourished. No distress.  HENT:  Head: Normocephalic.  Nose: Nose normal.  Eyes: Conjunctivae are normal. Scleral icterus (mild with faint jaundice) is present.  Neck: Neck supple. No tracheal deviation present.  Cardiovascular: Normal rate and regular rhythm.   Pulmonary/Chest: Effort normal. No respiratory distress.  Abdominal: Soft. She exhibits no distension.  Neurological: She is alert and oriented to person, place, and time.  Skin: Skin is warm and dry.  Psychiatric: She has a normal mood and affect.     ED Treatments / Results  Labs (all labs ordered are listed, but only abnormal  results are displayed) Labs Reviewed  COMPREHENSIVE METABOLIC PANEL  CBC WITH DIFFERENTIAL/PLATELET  APTT  PROTIME-INR  URINALYSIS, ROUTINE W REFLEX MICROSCOPIC  I-STAT CG4 LACTIC ACID, ED    EKG  EKG Interpretation None       Radiology Dg Chest 2 View  Result Date: 07/12/2016 CLINICAL DATA:  Acute onset of cough and shortness of breath. Low grade fever. Generalized chest pain. Initial encounter. EXAM: CHEST  2 VIEW COMPARISON:  Chest radiograph and CT of the chest performed 11/12/2015 FINDINGS: The lungs are well-aerated. A small right pleural effusion is noted. Mild right basilar opacity could reflect pneumonia, given the patient's symptoms. There is no evidence of pneumothorax. The heart is normal in size; the mediastinal contour is within normal limits. No acute osseous abnormalities are seen.  A chronic right lateral seventh rib deformity is noted. IMPRESSION: Small right pleural effusion noted. Mild right basilar airspace opacity could reflect pneumonia, given the patient's symptoms, though not well seen on the lateral view. Electronically Signed   By: Roanna Raider M.D.   On: 07/12/2016 18:57    Procedures Procedures (including critical care time)  Medications Ordered in ED Medications  cefTRIAXone (ROCEPHIN) 1 g in dextrose 5 % 50 mL IVPB (0 g Intravenous Stopped 07/12/16 2246)     Initial Impression / Assessment and Plan / ED Course  I have reviewed the triage vital signs and the nursing notes.  Pertinent labs & imaging results that were available during my care of the patient were reviewed by me and considered in my medical decision making (see chart for details).     51 y.o. female presents with body aches and fatigue worsening over last few days. Small effusion noted on CXR with cough starting today. Nonproductive, no clinical evidence of pneumonia causing cough. Hepatic function improved from prior evaluations. Heavy pyuria and convincing UA for infection which is  likely precipitating cause. Will treat empirically with keflex pending culture. Treated with rocephin here. No leukocytosis or other signs of developing sepsis. Plan to follow up with PCP as needed and return precautions discussed for worsening or new concerning symptoms.   Final Clinical Impressions(s) / ED Diagnoses   Final diagnoses:  Urinary tract infection without hematuria, site unspecified  Pleural effusion on right    New Prescriptions New Prescriptions   No medications on file     Lyndal Pulley, MD 07/13/16 [phone]

## 2016-07-15 LAB — URINE CULTURE: Culture: >=100000 — AB

## 2016-07-16 ENCOUNTER — Ambulatory Visit (HOSPITAL_COMMUNITY): Payer: Medicaid Other

## 2016-07-16 ENCOUNTER — Telehealth (HOSPITAL_BASED_OUTPATIENT_CLINIC_OR_DEPARTMENT_OTHER): Payer: Self-pay

## 2016-07-16 NOTE — Telephone Encounter (Signed)
Post ED Visit - Positive Culture Follow-up  Culture report reviewed by antimicrobial stewardship pharmacist:  [x]  Enzo BiNathan Batchelder, Pharm.D. []  Celedonio MiyamotoJeremy Frens, Pharm.D., BCPS []  Garvin FilaMike Maccia, Pharm.D. []  Georgina PillionElizabeth Martin, Pharm.D., BCPS []  ChisholmMinh Pham, [address]Pharm.D., BCPS, AAHIVP []  Estella HuskMichelle Turner, Pharm.D., BCPS, AAHIVP []  Tennis Mustassie Stewart, Pharm.D. []  Sherle Poeob Vincent, [place]D.  Positive urine culture, >/= 100,000 colonies -> E Coli Treated with Cephalexin, organism sensitive to the same and no further patient follow-up is required at this time.  Katrina Cohen, Katrina Cohen 07/16/2016, 12:07 PM

## 2016-07-21 ENCOUNTER — Encounter (HOSPITAL_COMMUNITY): Payer: Self-pay

## 2016-07-21 ENCOUNTER — Ambulatory Visit (HOSPITAL_COMMUNITY): Payer: Medicaid Other

## 2016-07-27 ENCOUNTER — Other Ambulatory Visit (HOSPITAL_COMMUNITY): Payer: Self-pay | Admitting: Internal Medicine

## 2016-07-27 ENCOUNTER — Ambulatory Visit (HOSPITAL_COMMUNITY)
Admission: RE | Admit: 2016-07-27 | Discharge: 2016-07-27 | Disposition: A | Discharge status: Home / Self Care | Payer: Medicaid Other | Source: Ambulatory Visit | Attending: Internal Medicine | Admitting: Internal Medicine

## 2016-07-27 DIAGNOSIS — R188 Other ascites: Principal | ICD-10-CM

## 2016-07-27 DIAGNOSIS — K7460 Unspecified cirrhosis of liver: Secondary | ICD-10-CM

## 2016-07-27 LAB — BODY FLUID CELL COUNT WITH DIFFERENTIAL
Eos, Fluid: 0 %
Lymphs, Fluid: 53 %
Monocyte-Macrophage-Serous Fluid: 44 % — ABNORMAL LOW (ref 50–90)
Neutrophil Count, Fluid: 3 % (ref 0–25)
Total Nucleated Cell Count, Fluid: 155 cu mm (ref 0–1000)

## 2016-07-27 MED ORDER — ALBUMIN HUMAN 25 % IV SOLN: 25.0000 g | Freq: Once | INTRAVENOUS | Status: DC

## 2016-07-27 MED ORDER — LIDOCAINE HCL (PF) 1 % IJ SOLN
INTRAMUSCULAR | Status: AC
  Filled 2016-07-27: qty 10

## 2016-07-27 MED ORDER — ALBUMIN HUMAN 25 % IV SOLN
INTRAVENOUS | Status: AC
  Filled 2016-07-27: qty 50

## 2016-07-27 MED ORDER — ALBUMIN HUMAN 25 % IV SOLN
12.5000 g | Freq: Once | INTRAVENOUS | Status: DC
  Filled 2016-07-27: qty 50

## 2016-07-27 NOTE — Procedures (Signed)
PROCEDURE SUMMARY:  Successful US guided paracentesis from RLQ.  Yielded 4.2 L of clear yellow fluid.  No immediate complications.  Pt tolerated well.   Specimen was sent for labs.  Brayton ElBRUNING, Regan Llorente PA-C 07/27/2016 2:46 PM

## 2016-07-27 NOTE — Progress Notes (Signed)
I attempted to start PIV on patient twice and was unsuccessful.  Pt stated that was enough and wanted to leave.  I asked her to let another nurse try one more time and she finally agreed.  Another nurse attempted a PIV and again was unsuccessful and pt stated that was it and she was done and left with her family via wheelchair.  VSS upon DC and I paged Brayton ElKevin Bruning PA and reported the above; no orders received.

## 2016-07-29 LAB — PATHOLOGIST SMEAR REVIEW

## 2016-08-02 ENCOUNTER — Ambulatory Visit (HOSPITAL_COMMUNITY)
Admission: RE | Admit: 2016-08-02 | Discharge: 2016-08-02 | Disposition: A | Discharge status: Home / Self Care | Payer: Medicaid Other | Source: Ambulatory Visit | Attending: Physician Assistant | Admitting: Physician Assistant

## 2016-08-02 DIAGNOSIS — J90 Pleural effusion, not elsewhere classified: Secondary | ICD-10-CM | POA: Insufficient documentation

## 2016-08-02 DIAGNOSIS — K8020 Calculus of gallbladder without cholecystitis without obstruction: Secondary | ICD-10-CM | POA: Insufficient documentation

## 2016-08-02 DIAGNOSIS — K7030 Alcoholic cirrhosis of liver without ascites: Secondary | ICD-10-CM

## 2016-08-02 DIAGNOSIS — K7031 Alcoholic cirrhosis of liver with ascites: Secondary | ICD-10-CM | POA: Insufficient documentation

## 2016-11-09 ENCOUNTER — Emergency Department (HOSPITAL_COMMUNITY): Payer: Medicaid Other

## 2016-11-09 ENCOUNTER — Emergency Department (HOSPITAL_COMMUNITY)
Admission: EM | Admit: 2016-11-09 | Discharge: 2016-11-09 | Disposition: A | Discharge status: Home / Self Care | Payer: Medicaid Other | Attending: Emergency Medicine | Admitting: Emergency Medicine

## 2016-11-09 DIAGNOSIS — I10 Essential (primary) hypertension: Secondary | ICD-10-CM | POA: Insufficient documentation

## 2016-11-09 DIAGNOSIS — F17210 Nicotine dependence, cigarettes, uncomplicated: Secondary | ICD-10-CM | POA: Insufficient documentation

## 2016-11-09 DIAGNOSIS — Z7984 Long term (current) use of oral hypoglycemic drugs: Secondary | ICD-10-CM | POA: Diagnosis not present

## 2016-11-09 DIAGNOSIS — Y939 Activity, unspecified: Secondary | ICD-10-CM | POA: Insufficient documentation

## 2016-11-09 DIAGNOSIS — Y929 Unspecified place or not applicable: Secondary | ICD-10-CM | POA: Diagnosis not present

## 2016-11-09 DIAGNOSIS — Y999 Unspecified external cause status: Secondary | ICD-10-CM | POA: Diagnosis not present

## 2016-11-09 DIAGNOSIS — N390 Urinary tract infection, site not specified: Secondary | ICD-10-CM | POA: Insufficient documentation

## 2016-11-09 DIAGNOSIS — R1011 Right upper quadrant pain: Secondary | ICD-10-CM | POA: Diagnosis present

## 2016-11-09 DIAGNOSIS — X509XXA Other and unspecified overexertion or strenuous movements or postures, initial encounter: Secondary | ICD-10-CM | POA: Insufficient documentation

## 2016-11-09 DIAGNOSIS — E119 Type 2 diabetes mellitus without complications: Secondary | ICD-10-CM | POA: Insufficient documentation

## 2016-11-09 DIAGNOSIS — S46819A Strain of other muscles, fascia and tendons at shoulder and upper arm level, unspecified arm, initial encounter: Secondary | ICD-10-CM | POA: Diagnosis not present

## 2016-11-09 DIAGNOSIS — K8050 Calculus of bile duct without cholangitis or cholecystitis without obstruction: Secondary | ICD-10-CM

## 2016-11-09 LAB — URINALYSIS, ROUTINE W REFLEX MICROSCOPIC
BILIRUBIN URINE: NEGATIVE
Glucose, UA: NEGATIVE mg/dL
HGB URINE DIPSTICK: NEGATIVE
KETONES UR: NEGATIVE mg/dL
NITRITE: POSITIVE — AB
PROTEIN: NEGATIVE mg/dL
Specific Gravity, Urine: 1.011 (ref 1.005–1.030)
pH: 5 (ref 5.0–8.0)

## 2016-11-09 LAB — COMPREHENSIVE METABOLIC PANEL
ALBUMIN: 3.1 g/dL — AB (ref 3.5–5.0)
ALT: 30 U/L (ref 14–54)
ANION GAP: 10 (ref 5–15)
AST: 42 U/L — ABNORMAL HIGH (ref 15–41)
Alkaline Phosphatase: 148 U/L — ABNORMAL HIGH (ref 38–126)
BILIRUBIN TOTAL: 4.7 mg/dL — AB (ref 0.3–1.2)
BUN: 23 mg/dL — ABNORMAL HIGH (ref 6–20)
CO2: 24 mmol/L (ref 22–32)
Calcium: 9 mg/dL (ref 8.9–10.3)
Chloride: 102 mmol/L (ref 101–111)
Creatinine, Ser: 0.96 mg/dL (ref 0.44–1.00)
GFR calc Af Amer: >60 mL/min (ref >60)
GFR calc non Af Amer: >60 mL/min (ref >60)
GLUCOSE: 133 mg/dL — AB (ref 65–99)
POTASSIUM: 3.8 mmol/L (ref 3.5–5.1)
SODIUM: 136 mmol/L (ref 135–145)
TOTAL PROTEIN: 6.7 g/dL (ref 6.5–8.1)

## 2016-11-09 LAB — CBC
HEMATOCRIT: 33.2 % — AB (ref 36.0–46.0)
HEMOGLOBIN: 11.3 g/dL — AB (ref 12.0–15.0)
MCH: 27.7 pg (ref 26.0–34.0)
MCHC: 34 g/dL (ref 30.0–36.0)
MCV: 81.4 fL (ref 78.0–100.0)
Platelets: 87 10*3/uL — ABNORMAL LOW (ref 150–400)
RBC: 4.08 MIL/uL (ref 3.87–5.11)
RDW: 19.2 % — ABNORMAL HIGH (ref 11.5–15.5)
WBC: 4.7 10*3/uL (ref 4.0–10.5)

## 2016-11-09 LAB — LIPASE, BLOOD: LIPASE: 109 U/L — AB (ref 11–51)

## 2016-11-09 MED ORDER — CEPHALEXIN 500 MG PO CAPS
500.0000 mg | ORAL_CAPSULE | Freq: Once | ORAL | Status: AC
  Administered 2016-11-09: 500 mg via ORAL
  Filled 2016-11-09: qty 1

## 2016-11-09 MED ORDER — METHOCARBAMOL 500 MG PO TABS: 500.0000 mg | ORAL_TABLET | Freq: Two times a day (BID) | ORAL | 0 refills | Status: DC

## 2016-11-09 MED ORDER — METHOCARBAMOL 1000 MG/10ML IJ SOLN
500.0000 mg | Freq: Once | INTRAVENOUS | Status: AC
  Administered 2016-11-09: 500 mg via INTRAVENOUS
  Filled 2016-11-09: qty 5

## 2016-11-09 MED ORDER — METHOCARBAMOL 1000 MG/10ML IJ SOLN: 500.0000 mg | Freq: Once | INTRAMUSCULAR | Status: DC

## 2016-11-09 MED ORDER — CEPHALEXIN 500 MG PO CAPS: 500.0000 mg | ORAL_CAPSULE | Freq: Two times a day (BID) | ORAL | 0 refills | Status: AC

## 2016-11-09 NOTE — ED Triage Notes (Addendum)
Pt BIB [facility] c/o RUQ pain starting at 11pm. Pt has a hx of liver cirrhosis and recently enrolled in hospice. Pt self-administered 15 mg hydrocodone PO at 2315 and 30 mg morphine PO at 0020. She states that she wants her liver enzymes checked and that she is not here for pain medication. She reports that her pain has been well-managed at home until tonight. A&Ox4.

## 2016-11-09 NOTE — Discharge Instructions (Signed)
Please read and follow all provided instructions.  Your diagnoses today include:  1. Urinary tract infection without hematuria, site unspecified   2. RUQ pain   3. Biliary colic   4. Strain of trapezius muscle, unspecified laterality, initial encounter     Tests performed today include: Vital signs. See below for your results today.   Medications prescribed:  Take as prescribed   Home care instructions:  Follow any educational materials contained in this packet.  Follow-up instructions: Please follow-up with your primary care provider for further evaluation of symptoms and treatment   Return instructions:  Please return to the Emergency Department if you do not get better, if you get worse, or new symptoms OR  - Fever (temperature greater than 101.57F)  - Bleeding that does not stop with holding pressure to the area    -Severe pain (please note that you may be more sore the day after your accident)  - Chest Pain  - Difficulty breathing  - Severe nausea or vomiting  - Inability to tolerate food and liquids  - Passing out  - Skin becoming red around your wounds  - Change in mental status (confusion or lethargy)  - New numbness or weakness    Please return if you have any other emergent concerns.  Additional Information:  Your vital signs today were: BP 94/63 (BP Location: Left Arm)    Pulse 73    Temp 98.1 F (36.7 C) (Oral)    Resp 18    LMP 01/29/2013    SpO2 99%  If your blood pressure (BP) was elevated above 135/85 this visit, please have this repeated by your doctor within one month. ---------------

## 2016-11-09 NOTE — ED Provider Notes (Signed)
[facility]-EMERGENCY DEPT Provider Note   CSN: [id number] Arrival date & time: 11/09/16  0038     History   Chief Complaint Chief Complaint  Patient presents with  . Abdominal Pain    HPI Katrina Cohen is a 51 y.o. female.  HPI  51 y.o. female with a hx of Cirrhosis, DM, HTN, presents to the Emergency Department today complaining of RUQ abdominal pain x 1 week. Intermittent. Notes most recent pain started around 11pm. Pt states that she has cirrhosis of the liver and has been communicating with hospice as she is not a liver transplant candidate. Pt states she is not enrolled in hospice. Pt states that she is not here for pain control as she has adequate pain medication at home. She just wants to know what's causing the new onset pain. Notes N/V on Saturday. None today. No diarrhea. No CP/SOB. No fevers. No URI symptoms. Pt has had [place] in past for RUQ and told she had gallstones. Pt also endorse bilateral cervical neck pain x 2 weeks. Seen by PCP for this and given muscle relaxant. Notes some relief with medication, but still persists. No numbness in BUE. NO decrease in strength. NO trauma to area. No other symptoms noted.   Past Medical History:  Diagnosis Date  . Bipolar disorder (HCC) 1980s   with depression, anxiety  . Cirrhosis (HCC)   . Depression with anxiety 1980s   [facility] admission 05/2011.    . Diabetes mellitus   . Dysphagia 11/2014   normal esophagram 11/2014. EGD per Dr Dulce Sellar.   . Hypertension   . Jaundice 04/2015   In setting of infectious mononucleosis. Ultrasound in [place]: hepatosplenomegaly, portal htn, possible cirrhosis  . Liver disease   . Mononucleosis 04/15/2015    Patient Active Problem List   Diagnosis Date Noted  . Right upper quadrant abdominal pain 04/02/2016  . UTI (urinary tract infection) 04/02/2016  . Cholelithiasis 04/02/2016  . Depression with anxiety 04/02/2016  . Chronic pain 04/02/2016  . SOB (shortness of breath) 11/12/2015  . COPD  exacerbation (HCC) 11/12/2015  . Acute respiratory failure with hypoxia (HCC) 11/12/2015  . CAP (community acquired pneumonia) 11/07/2015  . Cirrhosis (HCC) 11/07/2015  . Thrombocytopenia (HCC) 11/07/2015  . Alcohol abuse 04/30/2015  . Acute hepatitis 04/30/2015  . Tobacco abuse 04/30/2015  . Malnutrition of moderate degree 04/29/2015  . Hypokalemia 04/29/2015  . Anemia 04/29/2015  . Hyponatremia 04/29/2015  . Coagulopathy (HCC) 04/29/2015  . Hypoalbuminemia 04/29/2015  . Mononucleosis 04/28/2015  . Vitamin D deficiency 06/11/2011  . Major depression (HCC) 06/10/2011    Past Surgical History:  Procedure Laterality Date  . APPENDECTOMY    . CESAREAN SECTION    . COLONOSCOPY    . ESOPHAGOGASTRODUODENOSCOPY     Dr Dulce Sellar ? 2016.   . IR GENERIC HISTORICAL  04/20/2016   IR PARACENTESIS 04/20/2016 Darrell K Allred, PA-C [facility]-INTER[facility] RAD    OB History    No data available       Home Medications    Prior to Admission medications   Medication Sig Start Date End Date Taking? Authorizing Provider  albuterol (PROVENTIL HFA;VENTOLIN HFA) 108 (90 Base) MCG/ACT inhaler Inhale 2 puffs into the lungs every 4 (four) hours as needed for wheezing or shortness of breath.  09/02/15  Yes [provider]  albuterol (PROVENTIL) (2.5 MG/3ML) 0.083% nebulizer solution Take 3 mLs (2.5 mg total) by nebulization every 4 (four) hours as needed for wheezing or shortness of breath. 11/13/15  Yes  Standley Brooking, MD  ARIPiprazole (ABILIFY) 5 MG tablet Take 5 mg by mouth at bedtime.  04/02/15  Yes [provider]  buPROPion (WELLBUTRIN SR) 150 MG 12 hr tablet Take 150 mg by mouth 2 (two) times daily.  04/20/16 04/20/17 Yes [provider]  citalopram (CELEXA) 20 MG tablet Take 20 mg by mouth at bedtime.    Yes [provider]  cyclobenzaprine (FLEXERIL) 5 MG tablet Take 5 mg by mouth 3 (three) times daily as needed for muscle spasms.   Yes [provider]    glyBURIDE (DIABETA) 2.5 MG tablet Take 2.5 mg by mouth daily with breakfast.  03/04/16 03/04/17 Yes [provider]  lactulose (CHRONULAC) 10 GM/15ML solution Take 15 mLs by mouth daily as needed for mild constipation or moderate constipation.  08/25/16  Yes [provider]  LORazepam (ATIVAN) 1 MG tablet Take 1 mg by mouth every 8 (eight) hours as needed for anxiety.    Yes [provider]  morphine (MS CONTIN) 30 MG 12 hr tablet Take 30 mg by mouth 2 (two) times daily. 11/05/16  Yes [provider]  oxyCODONE (ROXICODONE) 15 MG immediate release tablet Take 15 mg by mouth every 8 (eight) hours as needed for pain.   Yes [provider]  spironolactone (ALDACTONE) 50 MG tablet Take 100 mg by mouth 2 (two) times daily.    Yes [provider]  cefUROXime (CEFTIN) 500 MG tablet Take 1 tablet (500 mg total) by mouth 2 (two) times daily with a meal. Patient not taking: Reported on 04/20/2016 04/03/16   Clydia Llano, MD  metroNIDAZOLE (FLAGYL) 500 MG tablet Take 1 tablet (500 mg total) by mouth 2 (two) times daily. Patient not taking: Reported on 04/20/2016 02/28/16   Lorre Nick, MD    Family History Family History  Problem Relation Age of Onset  . Diabetes Mother   . Cancer Father   . Diabetes Sister   . Diabetes Brother     Social History Social History  Substance Use Topics  . Smoking status: Current Every Day Smoker    Packs/day: 0.00    Types: Cigarettes  . Smokeless tobacco: Never Used  . Alcohol use Yes     Comment: occasionally     Allergies   Tizanidine   Review of Systems Review of Systems ROS reviewed and all are negative for acute change except as noted in the HPI.  Physical Exam Updated Vital Signs BP 113/67 (BP Location: Left Arm)   Pulse 70   Temp 98.1 F (36.7 C) (Oral)   Resp 14   LMP 01/29/2013   SpO2 99%   Physical Exam  Constitutional: She is oriented to person, place, and time. Vital signs are  normal. She appears well-developed and well-nourished.  HENT:  Head: Normocephalic and atraumatic.  Right Ear: Hearing normal.  Left Ear: Hearing normal.  Eyes: Conjunctivae and EOM are normal. Pupils are equal, round, and reactive to light.  Mild scleral icterus   Neck: Normal range of motion. Neck supple.  Cardiovascular: Normal rate, regular rhythm, normal heart sounds and intact distal pulses.   Pulmonary/Chest: Effort normal and breath sounds normal.  Abdominal: Soft. Normal appearance. Bowel sounds are increased. There is tenderness in the right upper quadrant and epigastric area. There is no rigidity, no rebound, no guarding, no CVA tenderness, no tenderness at McBurney's point and negative Murphy's sign.  Abdomen soft. No ascites. TTP RUQ. Pos Murphys.   Musculoskeletal: Normal range of motion.  TTP bilateral cervical musculature. No midline tenderness. No palpable or visible deformities.   Neurological: She is alert and oriented to person, place, and time.  Skin: Skin is warm and dry.  Jaundice noted  Psychiatric: She has a normal mood and affect. Her speech is normal and behavior is normal. Thought content normal.  Nursing note and vitals reviewed.  ED Treatments / Results  Labs (all labs ordered are listed, but only abnormal results are displayed) Labs Reviewed  LIPASE, BLOOD - Abnormal; Notable for the following:       Result Value   Lipase 109 (*)    All other components within normal limits  COMPREHENSIVE METABOLIC PANEL - Abnormal; Notable for the following:    Glucose, Bld 133 (*)    BUN 23 (*)    Albumin 3.1 (*)    AST 42 (*)    Alkaline Phosphatase 148 (*)    Total Bilirubin 4.7 (*)    All other components within normal limits  CBC - Abnormal; Notable for the following:    Hemoglobin 11.3 (*)    HCT 33.2 (*)    RDW 19.2 (*)    Platelets 87 (*)    All other components within normal limits  URINALYSIS, ROUTINE W REFLEX MICROSCOPIC - Abnormal; Notable for the  following:    APPearance CLOUDY (*)    Nitrite POSITIVE (*)    Leukocytes, UA LARGE (*)    Bacteria, UA MANY (*)    Squamous Epithelial / LPF 0-5 (*)    All other components within normal limits  URINE CULTURE    EKG  EKG Interpretation None       Radiology US Abdomen Limited Ruq  Result Date: 11/09/2016 CLINICAL DATA:  Right upper quadrant pain. History of gallstones and cirrhosis. EXAM: ULTRASOUND ABDOMEN LIMITED RIGHT UPPER QUADRANT COMPARISON:  Most recent ultrasound 08/02/2016 FINDINGS: Gallbladder: Only partially distended. Small calculi noted measuring up to 3 mm. Mild gallbladder wall thickening of 3.6 mm. No sonographic Murphy sign noted by sonographer. Common bile duct: Diameter: 6 mm proximally with normal tapering. Liver: No focal lesion identified. Diffusely increased in parenchymal echogenicity. Nodular hepatic contours. Trace ascites hepatorenal space. IMPRESSION: 1. Partially distended gallbladder containing gallstones. Mild gallbladder wall thickening is nonspecific in the setting of chronic liver disease. Lack of sonographic Eulah Pont sign favors against acute cholecystitis. 2. Increased hepatic echogenicity with nodular contour suggesting cirrhosis. Trace perihepatic ascites. Electronically Signed   By: Rubye Oaks M.D.   On: 11/09/2016 05:06    Procedures Procedures (including critical care time)  Medications Ordered in ED Medications  methocarbamol (ROBAXIN) 500 mg in dextrose 5 % 50 mL IVPB (500 mg Intravenous New Bag/Given 11/09/16 0500)     Initial Impression / Assessment and Plan / ED Course  I have reviewed the triage vital signs and the nursing notes.  Pertinent labs & imaging results that were available during my care of the patient were reviewed by me and considered in my medical decision making (see chart for details).  Final Clinical Impressions(s) / ED Diagnoses  {I have reviewed and evaluated the relevant laboratory values. {I have reviewed and  evaluated the relevant imaging studies.  {I have reviewed the relevant previous healthcare records.  {I obtained HPI from historian. {Patient discussed with supervising physician.  ED Course:  Assessment: Pt is a 51 y.o. female with hx  Cirrhosis, DM, HTN who presents with RUQ abdominal pain PTA. Notes pain intermittent x 1 week. [place] in past show gallstones. N/V  Saturday. None today. No worsening with PO intake. No fevers. No URI symptoms. On exam, pt in NAD. Nontoxic/nonseptic appearing. VSS. Afebrile. Lungs CTA. Heart RRR. Abdomen with mild TTP RUQ. No murphys. CBC unremarkable. CMP with improving Bili from previous. AST/ALT baseline. Lipase with mild elevation. UA with UTI. Culture sent. RUQ US shows gall bladder distention with multiple stones. No sonographic murphy's. Doubt acute chole. Pt also noted bilateral upper back pain in cervical region. No numbness/tingling. Muscle relaxants with improvement. Likely muscle strain. Given robaxin in ED With improvement of symptoms. Discussed with attending physician who has seen patient. Likely biliary cholic. Pt also has noted UTI. Will treat with ABX. Will have pt follow up with PCP. Plan is to DC home. Pt ok with DC. At time of discharge, Patient is in no acute distress. Vital Signs are stable. Patient is able to ambulate. Patient able to tolerate PO.   Disposition/Plan:  DC Home Additional Verbal discharge instructions given and discussed with patient.  Pt Instructed to f/u with PCP in the next week for evaluation and treatment of symptoms. Return precautions given Pt acknowledges and agrees with plan  Supervising Physician Zadie RhineWickline, Donald, MD  Final diagnoses:  RUQ pain  Urinary tract infection without hematuria, site unspecified  Biliary colic  Strain of trapezius muscle, unspecified laterality, initial encounter    New Prescriptions New Prescriptions   No medications on file     Audry PiliMohr, Jaiyana Canale, Cordelia Poche-C 11/09/16 [id number]    Zadie RhineWickline, Donald,  MD 11/10/16 [phone]

## 2016-11-09 NOTE — ED Notes (Signed)
Pt also c/o of neck pain. She states that hospice and other doctors are just giving her pain medication, but she wants a scan to find out what is wrong with it. She states that all the pain medication just makes her dizzy and tired.

## 2016-11-09 NOTE — ED Notes (Signed)
Patient ambulated to bathroom x 1 assist. 

## 2016-11-09 NOTE — ED Notes (Signed)
Patient is alert and oriented x3.  She was given DC instructions and follow up visit instructions.  Patient gave verbal understanding. She was DC ambulatory under her own power to home.  V/S stable.  He was not showing any signs of distress on DC 

## 2016-11-11 LAB — URINE CULTURE: Culture: >=100000 — AB

## 2016-11-12 ENCOUNTER — Telehealth: Payer: Self-pay | Admitting: *Deleted

## 2016-11-12 NOTE — Telephone Encounter (Signed)
Post ED Visit - Positive Culture Follow-up  Culture report reviewed by antimicrobial stewardship pharmacist:  []  Katrina Cohen, Pharm.D. []  Katrina Cohen, Pharm.D., BCPS AQ-ID []  Katrina Cohen, Pharm.D., BCPS []  Katrina Cohen, Pharm.D., BCPS []  Katrina Cohen, [address] BoulevardPharm.D., BCPS, AAHIVP []  Katrina Cohen, Pharm.D., BCPS, AAHIVP []  Katrina Cohen, PharmD, BCPS [x]  Katrina Cohen, PharmD, BCPS []  Katrina Cohen, PharmD, BCPS  Positive urine culture Treated with Cephalexin organism sensitive to the same and no further patient follow-up is required at this time.  Katrina Cohen, Katrina Cohen 11/12/2016, 11:12 AM

## 2016-12-15 ENCOUNTER — Emergency Department (HOSPITAL_COMMUNITY): Payer: Medicaid Other

## 2016-12-15 ENCOUNTER — Emergency Department (HOSPITAL_COMMUNITY)
Admission: EM | Admit: 2016-12-15 | Discharge: 2016-12-15 | Disposition: A | Discharge status: Home / Self Care | Payer: Medicaid Other | Attending: Emergency Medicine | Admitting: Emergency Medicine

## 2016-12-15 DIAGNOSIS — E119 Type 2 diabetes mellitus without complications: Secondary | ICD-10-CM | POA: Insufficient documentation

## 2016-12-15 DIAGNOSIS — Z79899 Other long term (current) drug therapy: Secondary | ICD-10-CM | POA: Insufficient documentation

## 2016-12-15 DIAGNOSIS — F17210 Nicotine dependence, cigarettes, uncomplicated: Secondary | ICD-10-CM | POA: Insufficient documentation

## 2016-12-15 DIAGNOSIS — I10 Essential (primary) hypertension: Secondary | ICD-10-CM | POA: Diagnosis not present

## 2016-12-15 DIAGNOSIS — R112 Nausea with vomiting, unspecified: Secondary | ICD-10-CM | POA: Diagnosis present

## 2016-12-15 DIAGNOSIS — D696 Thrombocytopenia, unspecified: Secondary | ICD-10-CM | POA: Insufficient documentation

## 2016-12-15 DIAGNOSIS — N3000 Acute cystitis without hematuria: Secondary | ICD-10-CM

## 2016-12-15 DIAGNOSIS — J449 Chronic obstructive pulmonary disease, unspecified: Secondary | ICD-10-CM | POA: Diagnosis not present

## 2016-12-15 LAB — URINALYSIS, ROUTINE W REFLEX MICROSCOPIC
BACTERIA UA: NONE SEEN
BILIRUBIN URINE: NEGATIVE
Glucose, UA: NEGATIVE mg/dL
HGB URINE DIPSTICK: NEGATIVE
Ketones, ur: NEGATIVE mg/dL
NITRITE: NEGATIVE
PH: 6 (ref 5.0–8.0)
Protein, ur: NEGATIVE mg/dL
SPECIFIC GRAVITY, URINE: 1.009 (ref 1.005–1.030)

## 2016-12-15 LAB — CBC
HEMATOCRIT: 37.5 % (ref 36.0–46.0)
Hemoglobin: 12.9 g/dL (ref 12.0–15.0)
MCH: 29.5 pg (ref 26.0–34.0)
MCHC: 34.4 g/dL (ref 30.0–36.0)
MCV: 85.6 fL (ref 78.0–100.0)
PLATELETS: 72 10*3/uL — AB (ref 150–400)
RBC: 4.38 MIL/uL (ref 3.87–5.11)
RDW: 17.7 % — AB (ref 11.5–15.5)
WBC: 5.9 10*3/uL (ref 4.0–10.5)

## 2016-12-15 LAB — LIPASE, BLOOD: Lipase: 63 U/L — ABNORMAL HIGH (ref 11–51)

## 2016-12-15 LAB — COMPREHENSIVE METABOLIC PANEL
ALBUMIN: 3.4 g/dL — AB (ref 3.5–5.0)
ALK PHOS: 106 U/L (ref 38–126)
ALT: 28 U/L (ref 14–54)
ANION GAP: 9 (ref 5–15)
AST: 42 U/L — ABNORMAL HIGH (ref 15–41)
BUN: 17 mg/dL (ref 6–20)
CALCIUM: 8.8 mg/dL — AB (ref 8.9–10.3)
CO2: 25 mmol/L (ref 22–32)
Chloride: 100 mmol/L — ABNORMAL LOW (ref 101–111)
Creatinine, Ser: 0.85 mg/dL (ref 0.44–1.00)
GFR calc Af Amer: >60 mL/min (ref >60)
GFR calc non Af Amer: >60 mL/min (ref >60)
GLUCOSE: 144 mg/dL — AB (ref 65–99)
Potassium: 4 mmol/L (ref 3.5–5.1)
SODIUM: 134 mmol/L — AB (ref 135–145)
Total Bilirubin: 8.4 mg/dL — ABNORMAL HIGH (ref 0.3–1.2)
Total Protein: 7.2 g/dL (ref 6.5–8.1)

## 2016-12-15 LAB — PROTIME-INR
INR: 1.69
PROTHROMBIN TIME: 20.1 s — AB (ref 11.4–15.2)

## 2016-12-15 LAB — AMMONIA: AMMONIA: 25 umol/L (ref 9–35)

## 2016-12-15 MED ORDER — ONDANSETRON HCL 4 MG/2ML IJ SOLN
4.0000 mg | Freq: Once | INTRAMUSCULAR | Status: AC
  Administered 2016-12-15: 4 mg via INTRAVENOUS
  Filled 2016-12-15: qty 2

## 2016-12-15 MED ORDER — SODIUM CHLORIDE 0.9 % IV BOLUS (SEPSIS)
1000.0000 mL | Freq: Once | INTRAVENOUS | Status: AC
  Administered 2016-12-15: 1000 mL via INTRAVENOUS

## 2016-12-15 MED ORDER — SODIUM CHLORIDE 0.9 % IV BOLUS (SEPSIS): 1000.0000 mL | Freq: Once | INTRAVENOUS | Status: DC

## 2016-12-15 MED ORDER — NITROFURANTOIN MONOHYD MACRO 100 MG PO CAPS: 100.0000 mg | ORAL_CAPSULE | Freq: Two times a day (BID) | ORAL | 0 refills | Status: DC

## 2016-12-15 NOTE — ED Provider Notes (Signed)
[facility] Provider Note   CSN: [id number] Arrival date & time: 12/15/16  1884     History   Chief Complaint Chief Complaint  Patient presents with  . Emesis    HPI Katrina Cohen is a 51 y.o. female with h/o alcoholic cirrhosis c/b portal HTN and ascites, DM, HTN, presents to ED for evaluation of nausea and vomiting x 4 days and epigastric abdominal pain x 2 days. Decreased PO foods due to nausea, has been tolerating small amounts of water only. She started keflex 5 days ago for UTI, developed n/v two days after taking keflex and has since stopped. Reports taken keflex in the past without problems. Reports dark and malodorous urine, but no dysuria.   Denies fevers, cough, SOB, CP, blood in emesis, diarrhea, melena, hematochezia, abdominal distention, confusion. No ingestion of tylenol or ETOH in years.   Patient has hospice at home weekly. She is not a transplant candidate yet.   HPI  Past Medical History:  Diagnosis Date  . Bipolar disorder (HCC) 1980s   with depression, anxiety  . Cirrhosis (HCC)   . Depression with anxiety 1980s   [facility] admission 05/2011.    . Diabetes mellitus   . Dysphagia 11/2014   normal esophagram 11/2014. EGD per Dr Dulce Sellar.   . Hypertension   . Jaundice 04/2015   In setting of infectious mononucleosis. Ultrasound in [place]: hepatosplenomegaly, portal htn, possible cirrhosis  . Liver disease   . Mononucleosis 04/15/2015    Patient Active Problem List   Diagnosis Date Noted  . Right upper quadrant abdominal pain 04/02/2016  . UTI (urinary tract infection) 04/02/2016  . Cholelithiasis 04/02/2016  . Depression with anxiety 04/02/2016  . Chronic pain 04/02/2016  . SOB (shortness of breath) 11/12/2015  . COPD exacerbation (HCC) 11/12/2015  . Acute respiratory failure with hypoxia (HCC) 11/12/2015  . CAP (community acquired pneumonia) 11/07/2015  . Cirrhosis (HCC) 11/07/2015  . Thrombocytopenia (HCC) 11/07/2015  . Alcohol abuse  04/30/2015  . Acute hepatitis 04/30/2015  . Tobacco abuse 04/30/2015  . Malnutrition of moderate degree 04/29/2015  . Hypokalemia 04/29/2015  . Anemia 04/29/2015  . Hyponatremia 04/29/2015  . Coagulopathy (HCC) 04/29/2015  . Hypoalbuminemia 04/29/2015  . Mononucleosis 04/28/2015  . Vitamin D deficiency 06/11/2011  . Major depression (HCC) 06/10/2011    Past Surgical History:  Procedure Laterality Date  . APPENDECTOMY    . CESAREAN SECTION    . COLONOSCOPY    . ESOPHAGOGASTRODUODENOSCOPY     Dr Dulce Sellar ? 2016.   . IR GENERIC HISTORICAL  04/20/2016   IR PARACENTESIS 04/20/2016 Darrell K Allred, PA-C [facility]    OB History    No data available       Home Medications    Prior to Admission medications   Medication Sig Start Date End Date Taking? Authorizing Provider  albuterol (PROVENTIL HFA;VENTOLIN HFA) 108 (90 Base) MCG/ACT inhaler Inhale 2 puffs into the lungs every 4 (four) hours as needed for wheezing or shortness of breath.  09/02/15  Yes [provider]  albuterol (PROVENTIL) (2.5 MG/3ML) 0.083% nebulizer solution Take 3 mLs (2.5 mg total) by nebulization every 4 (four) hours as needed for wheezing or shortness of breath. 11/13/15  Yes Standley Brooking, MD  ARIPiprazole (ABILIFY) 5 MG tablet Take 5 mg by mouth at bedtime.  04/02/15  Yes [provider]  buPROPion (WELLBUTRIN SR) 150 MG 12 hr tablet Take 150 mg by mouth 2 (two) times daily.  04/20/16 04/20/17 Yes [provider]  cephALEXin (KEFLEX) 500 MG capsule Take 500 mg by mouth 2 (two) times daily. 12/07/16 12/21/16 Yes [provider]  citalopram (CELEXA) 20 MG tablet Take 20 mg by mouth at bedtime.    Yes [provider]  glyBURIDE (DIABETA) 2.5 MG tablet Take 2.5 mg by mouth daily with breakfast.  03/04/16 03/04/17 Yes [provider]  lactulose (CHRONULAC) 10 GM/15ML solution Take 15 mLs by mouth daily as needed for mild constipation or moderate  constipation.  08/25/16  Yes [provider]  Liniments (BLUE-EMU SUPER STRENGTH) CREA Apply 1 application topically as needed (For cramping in hands or neck.).   Yes [provider]  LORazepam (ATIVAN) 1 MG tablet Take 1 mg by mouth every 8 (eight) hours as needed for anxiety.    Yes [provider]  methocarbamol (ROBAXIN) 500 MG tablet Take 1 tablet (500 mg total) by mouth 2 (two) times daily. Patient taking differently: Take 500 mg by mouth 2 (two) times daily as needed for muscle spasms.  11/09/16  Yes Audry Pili, PA-C  MILK THISTLE PO Take 1 tablet by mouth 3 (three) times daily.   Yes [provider]  morphine (MS CONTIN) 30 MG 12 hr tablet Take 30 mg by mouth 2 (two) times daily. 11/05/16  Yes [provider]  OVER THE COUNTER MEDICATION Take 2 capsules by mouth 2 (two) times daily. Nettles   Yes [provider]  oxyCODONE (ROXICODONE) 15 MG immediate release tablet Take 15 mg by mouth every 8 (eight) hours as needed for pain.   Yes [provider]  promethazine (PHENERGAN) 25 MG tablet Take 25 mg by mouth every 6 (six) hours as needed for nausea or vomiting.   Yes [provider]  spironolactone (ALDACTONE) 50 MG tablet Take 100 mg by mouth 2 (two) times daily.    Yes [provider]    Family History Family History  Problem Relation Age of Onset  . Diabetes Mother   . Cancer Father   . Diabetes Sister   . Diabetes Brother     Social History Social History  Substance Use Topics  . Smoking status: Current Every Day Smoker    Packs/day: 0.00    Types: Cigarettes  . Smokeless tobacco: Never Used  . Alcohol use Yes     Comment: occasionally     Allergies   Tizanidine   Review of Systems Review of Systems  Constitutional: Positive for appetite change. Negative for chills, diaphoresis and fever.  HENT: Negative for congestion and sore throat.   Eyes: Negative for visual disturbance.    Respiratory: Negative for cough and shortness of breath.   Cardiovascular: Negative for chest pain, palpitations and leg swelling.  Gastrointestinal: Positive for abdominal pain, nausea and vomiting. Negative for abdominal distention, blood in stool, constipation and diarrhea.  Genitourinary: Negative for difficulty urinating, dysuria, vaginal bleeding and vaginal discharge.       +dark and malodorous urine  Musculoskeletal: Negative for back pain.  Skin: Negative for color change.  Allergic/Immunologic: Positive for immunocompromised state.  Neurological: Negative for tremors, light-headedness and headaches.     Physical Exam Updated Vital Signs BP (!) 160/80   Pulse 76   Temp 98.4 F (36.9 C) (Oral)   Resp 17   Ht 5\' 4"  (1.626 m)   LMP 01/29/2013   SpO2 100%   Physical Exam  Constitutional: She is oriented to person, place, and time. She appears well-developed and well-nourished. No distress.  Jaundiced female, no acute distress  HENT:  Head: Normocephalic and atraumatic.  Nose: Nose normal.  Mouth/Throat: Oropharynx is clear and moist. No oropharyngeal exudate.  Top of the tongue is slightly dry, equal mucosa is moist Scleral icterus  Eyes: Pupils are equal, round, and reactive to light. Conjunctivae and EOM are normal. Scleral icterus is present.  Neck: Normal range of motion. Neck supple.  Cardiovascular: Normal rate, regular rhythm, normal heart sounds and intact distal pulses.   No murmur heard. Pulmonary/Chest: Effort normal and breath sounds normal. She exhibits no tenderness.  Abdominal: Soft. Bowel sounds are normal. She exhibits no distension and no mass. There is tenderness. There is no rebound and no guarding.  Diffuse abdominal tenderness most significant at the right upper quadrant and epigastric region.  Suprapubic tenderness.  No CVA tenderness. Non-palpable liver.  No palmar erythema or or visible superficial abdominal veins.  Musculoskeletal: Normal  range of motion. She exhibits no deformity.  Lymphadenopathy:    She has no cervical adenopathy.  Neurological: She is alert and oriented to person, place, and time. No sensory deficit.  No confusion, pt A&O to self place and time. No asterixis.   Skin: Skin is warm and dry. Capillary refill takes less than 2 seconds.  Psychiatric: She has a normal mood and affect. Her behavior is normal. Judgment and thought content normal.  Nursing note and vitals reviewed.    ED Treatments / Results  Labs (all labs ordered are listed, but only abnormal results are displayed) Labs Reviewed  LIPASE, BLOOD - Abnormal; Notable for the following:       Result Value   Lipase 63 (*)    All other components within normal limits  COMPREHENSIVE METABOLIC PANEL - Abnormal; Notable for the following:    Sodium 134 (*)    Chloride 100 (*)    Glucose, Bld 144 (*)    Calcium 8.8 (*)    Albumin 3.4 (*)    AST 42 (*)    Total Bilirubin 8.4 (*)    All other components within normal limits  CBC - Abnormal; Notable for the following:    RDW 17.7 (*)    Platelets 72 (*)    All other components within normal limits  URINALYSIS, ROUTINE W REFLEX MICROSCOPIC - Abnormal; Notable for the following:    Color, Urine AMBER (*)    Leukocytes, UA MODERATE (*)    Squamous Epithelial / LPF 0-5 (*)    All other components within normal limits  PROTIME-INR - Abnormal; Notable for the following:    Prothrombin Time 20.1 (*)    All other components within normal limits  URINE CULTURE  AMMONIA    EKG  EKG Interpretation None       Radiology No results found.  Procedures Procedures (including critical care time)  Medications Ordered in ED Medications  ondansetron (ZOFRAN) injection 4 mg (4 mg Intravenous Given 12/15/16 1251)  sodium chloride 0.9 % bolus 1,000 mL (0 mLs Intravenous Stopped 12/15/16 1441)     Initial Impression / Assessment and Plan / ED Course  I have reviewed the triage vital signs and the  nursing notes.  Pertinent labs & imaging results that were available during my care of the patient were reviewed by me and considered in my medical decision making (see chart for details).  Clinical Course as of Dec 16 1551  Wed Dec 15, 2016  1533 Called Ultrasound regarding delay, they are backed up. Will likely get U/S at 4p  [  CG]  1536 Leukocytes, UA: (!) MODERATE [CG]  1542 Lipase: (!) 63 [CG]  1542 AST: (!) 42 [CG]  1542 ALT: 28 [CG]  1542 Alkaline Phosphatase: 106 [CG]  1542 Prothrombin Time: (!) 20.1 [CG]  1542 INR: 1.69 [CG]  1542 Ammonia: 25 [CG]  1542 Re-evaluated patient. Abdominal discomfort improved, nausea resolved. Pt is hungry. Discussed delay with ultrasound, she is agreeable to stay.   [CG]    Clinical Course User Index [CG] Liberty HandyGibbons, Shaquala Broeker J, PA-C   51 year old female with history of alcoholic cirrhosis on rifaximin and lactulose complicated by ascites and portal hypertension presents to the ED for evaluation of nausea and NBNB emesis 4 days and epigastric abdominal pain 2 days. No fevers. No diarrhea, constipation, melena or hematochezia. No abdominal distention. No confusion. Symptoms started a couple days after starting Keflex for UTI, she has since stopped taking Keflex. Continues to report dark and malodorous urine, but no dysuria. H/o appendectomy and C-section.   Last EGD in 2017 normal without varices. Patient has required large volume paracentesis March 2018, uncomplicated. No h/o peritonitis.    Final Clinical Impressions(s) / ED Diagnoses   Final diagnoses:  Nausea and vomiting   Lab work today reassuring. No leukocytosis. LFTs normal. Lipase only mildly elevated, has been in the past. PT/INR wnl. U/A with moderate leukocytes only. Will tx for UTI with microbid instead of keflex, given history and suprapubic tenderness. Last urine cx shows sensitivity to macrobid. Will get RUQ U/S to r/o chole given known cholelithiasis.   Patient will be handed off to  the oncoming ED PA who will follow up RUQ ultrasound. If normal without evidence of cholecystitis, discharge with PO challenge, antiemetics and close GI follow-up/hepatologist follow-up encouraged.  New Prescriptions New Prescriptions   No medications on file     Jerrell MylarGibbons, Burma Ketcher J, PA-C 12/15/16 1553    Donnetta Hutchingook, Brian, MD 12/16/16 [phone]

## 2016-12-15 NOTE — ED Triage Notes (Addendum)
Pt presents with NV x 4 days. Hx of cirrohsis for which she is on hospice at home. Alert and oriented.

## 2016-12-15 NOTE — Discharge Instructions (Signed)
Please follow-up with your doctor today regarding your symptoms and your emergency room visit.

## 2016-12-16 LAB — URINE CULTURE: CULTURE: NO GROWTH

## 2016-12-16 NOTE — ED Provider Notes (Signed)
At shift changed I assumed care of this patient [facility] Gibbens PA-C.  Briefly Katrina EeJennifer Cohen is a 51 year old woman who presents today for evaluation of vomiting.  He has a history of alcoholic cirrhosis, portal hypertension and ascites.  On top of her nausea she has been having reportedly dark and foul-smelling urine.  UA was obtained consistent with urinary tract infection, plan for treatment with Macrobid. At the time I assumed care for this patient her right upper quadrant ultrasound was still pending.  I followed up on her right upper quadrant ultrasound which does not show any acute abnormalities. She passed a by mouth challenge. She was informed of all of her results and given the option to ask questions, all of which were answered to the best of my abilities. She was given strict return precautions and states her understanding. Will discharge with prescription of Macrobid for urinary tract infection and patient given instructions to follow-up with her primary care provider. Patient advised that if at any point her symptoms worsen or return, or she is unable to keep down by mouth intake and she may return to the emergency room for repeat evaluation.     At this time there does not appear to be any evidence of an acute emergency medical condition and the patient appears stable for discharge with appropriate outpatient follow up.Diagnosis was discussed with patient who verbalizes understanding and is agreeable to discharge.   Katrina GongHammond, Elizabeth W, [place]PA-C 12/16/16 0209    Tegeler, Canary Brimhristopher J, MD 12/16/16 1134

## 2017-01-02 ENCOUNTER — Encounter (HOSPITAL_COMMUNITY): Payer: Self-pay

## 2017-01-02 ENCOUNTER — Emergency Department (HOSPITAL_COMMUNITY)
Admission: EM | Admit: 2017-01-02 | Discharge: 2017-01-02 | Discharge status: Against Medical Advice | Payer: Medicaid Other | Attending: Emergency Medicine | Admitting: Emergency Medicine

## 2017-01-02 ENCOUNTER — Ambulatory Visit (HOSPITAL_COMMUNITY)
Admission: EM | Admit: 2017-01-02 | Discharge: 2017-01-02 | Disposition: A | Discharge status: Home / Self Care | Payer: Medicaid Other | Attending: Family Medicine | Admitting: Family Medicine

## 2017-01-02 DIAGNOSIS — K766 Portal hypertension: Secondary | ICD-10-CM | POA: Diagnosis not present

## 2017-01-02 DIAGNOSIS — Z5321 Procedure and treatment not carried out due to patient leaving prior to being seen by health care provider: Secondary | ICD-10-CM | POA: Insufficient documentation

## 2017-01-02 DIAGNOSIS — G8929 Other chronic pain: Secondary | ICD-10-CM | POA: Insufficient documentation

## 2017-01-02 DIAGNOSIS — I10 Essential (primary) hypertension: Secondary | ICD-10-CM | POA: Insufficient documentation

## 2017-01-02 DIAGNOSIS — F319 Bipolar disorder, unspecified: Secondary | ICD-10-CM | POA: Insufficient documentation

## 2017-01-02 DIAGNOSIS — E785 Hyperlipidemia, unspecified: Secondary | ICD-10-CM | POA: Insufficient documentation

## 2017-01-02 DIAGNOSIS — M545 Low back pain: Secondary | ICD-10-CM

## 2017-01-02 DIAGNOSIS — E119 Type 2 diabetes mellitus without complications: Secondary | ICD-10-CM | POA: Diagnosis not present

## 2017-01-02 DIAGNOSIS — J449 Chronic obstructive pulmonary disease, unspecified: Secondary | ICD-10-CM | POA: Insufficient documentation

## 2017-01-02 DIAGNOSIS — N390 Urinary tract infection, site not specified: Secondary | ICD-10-CM | POA: Diagnosis not present

## 2017-01-02 DIAGNOSIS — Z79899 Other long term (current) drug therapy: Secondary | ICD-10-CM | POA: Diagnosis not present

## 2017-01-02 DIAGNOSIS — R300 Dysuria: Secondary | ICD-10-CM | POA: Diagnosis not present

## 2017-01-02 DIAGNOSIS — K7460 Unspecified cirrhosis of liver: Secondary | ICD-10-CM | POA: Diagnosis not present

## 2017-01-02 DIAGNOSIS — F418 Other specified anxiety disorders: Secondary | ICD-10-CM | POA: Diagnosis not present

## 2017-01-02 DIAGNOSIS — F17210 Nicotine dependence, cigarettes, uncomplicated: Secondary | ICD-10-CM | POA: Insufficient documentation

## 2017-01-02 LAB — POCT URINALYSIS DIP (DEVICE)
Glucose, UA: NEGATIVE mg/dL
HGB URINE DIPSTICK: NEGATIVE
Ketones, ur: NEGATIVE mg/dL
NITRITE: NEGATIVE
PH: 7 (ref 5.0–8.0)
PROTEIN: 100 mg/dL — AB
Specific Gravity, Urine: 1.02 (ref 1.005–1.030)
UROBILINOGEN UA: 4 mg/dL — AB (ref 0.0–1.0)

## 2017-01-02 MED ORDER — SULFAMETHOXAZOLE-TRIMETHOPRIM 800-160 MG PO TABS: 1.0000 | ORAL_TABLET | Freq: Two times a day (BID) | ORAL | 0 refills | Status: DC

## 2017-01-02 NOTE — ED Triage Notes (Signed)
Pt presents today with urinary sxs. Has dysuria, urinary frequency, urine odor, and abdominal pain that started x2 days ago.

## 2017-01-02 NOTE — ED Triage Notes (Signed)
Patient coming from home  with c/o urinary problem. Patient state her urine been dark and foul smell to it.

## 2017-01-02 NOTE — Discharge Instructions (Signed)
Follow each antibiotic tablet with large glass of water.  Follow up in 7-10 days to make sure the urine infection has cleared

## 2017-01-02 NOTE — ED Provider Notes (Signed)
[facility]    CSN: [id number] Arrival date & time: 01/02/17  1635     History   Chief Complaint Chief Complaint  Patient presents with  . Dysuria    HPI Katrina Cohen is a 51 y.o. female.   This a 51 year old woman who [facility] complaining of possible urinary tract infection.  She was recently seen and evaluated for a urinary tract infection 10 days ago.  She was back to her usual self, in hospice care, until Friday when she began to develop Mallett's. She had one episode of vomiting last night but has not had any nausea since. She has some intermittent low back pain.  She has a history of recurrent UTI, most recently with Escherichia coli. Each of her urine cultures over the last UTI's have shown no significant resistance to any antibiotic.  Patient's problem list consists of . Anxiety  . Bipolar 1 disorder (CMS-HCC)  . Depression  . Diabetes mellitus (CMS-HCC)  . Hyperlipidemia  . Hypertension  . Liver disease  - cirrhosis with portal hypertension       Past Medical History:  Diagnosis Date  . Bipolar disorder (HCC) 1980s   with depression, anxiety  . Cirrhosis (HCC)   . Depression with anxiety 1980s   [facility]    . Diabetes mellitus   . Dysphagia 11/2014   normal esophagram 11/2014. EGD per Dr Dulce Sellarutlaw.   . Hypertension   . Jaundice 04/2015   In setting of infectious mononucleosis. Ultrasound in [place]: hepatosplenomegaly, portal htn, possible cirrhosis  . Liver disease   . Mononucleosis 04/15/2015    Patient Active Problem List   Diagnosis Date Noted  . Right upper quadrant abdominal pain 04/02/2016  . UTI (urinary tract infection) 04/02/2016  . Cholelithiasis 04/02/2016  . Depression with anxiety 04/02/2016  . Chronic pain 04/02/2016  . SOB (shortness of breath) 11/12/2015  . COPD exacerbation (HCC) 11/12/2015  . Acute respiratory failure with hypoxia (HCC) 11/12/2015   . CAP (community acquired pneumonia) 11/07/2015  . Cirrhosis (HCC) 11/07/2015  . Thrombocytopenia (HCC) 11/07/2015  . Alcohol abuse 04/30/2015  . Acute hepatitis 04/30/2015  . Tobacco abuse 04/30/2015  . Malnutrition of moderate degree 04/29/2015  . Hypokalemia 04/29/2015  . Anemia 04/29/2015  . Hyponatremia 04/29/2015  . Coagulopathy (HCC) 04/29/2015  . Hypoalbuminemia 04/29/2015  . Mononucleosis 04/28/2015  . Vitamin D deficiency 06/11/2011  . Major depression (HCC) 06/10/2011    Past Surgical History:  Procedure Laterality Date  . APPENDECTOMY    . CESAREAN SECTION    . COLONOSCOPY    . ESOPHAGOGASTRODUODENOSCOPY     Dr Dulce Sellarutlaw ? 2016.   . IR GENERIC HISTORICAL  04/20/2016   IR PARACENTESIS 04/20/2016 Darrell K Allred, PA-C [facility]    OB History    No data available       Home Medications    Prior to Admission medications   Medication Sig Start Date End Date Taking? Authorizing Provider  albuterol (PROVENTIL HFA;VENTOLIN HFA) 108 (90 Base) MCG/ACT inhaler Inhale 2 puffs into the lungs every 4 (four) hours as needed for wheezing or shortness of breath.  09/02/15  Yes [provider]  albuterol (PROVENTIL) (2.5 MG/3ML) 0.083% nebulizer solution Take 3 mLs (2.5 mg total) by nebulization every 4 (four) hours as needed for wheezing or shortness of breath. 11/13/15  Yes Standley BrookingGoodrich, Daniel P, MD  ARIPiprazole (ABILIFY) 5 MG tablet Take 5 mg by mouth  at bedtime.  04/02/15  Yes [provider]  buPROPion (WELLBUTRIN SR) 150 MG 12 hr tablet Take 150 mg by mouth 2 (two) times daily.  04/20/16 04/20/17 Yes [provider]  citalopram (CELEXA) 20 MG tablet Take 20 mg by mouth at bedtime.    Yes [provider]  glyBURIDE (DIABETA) 2.5 MG tablet Take 2.5 mg by mouth daily with breakfast.  03/04/16 03/04/17 Yes [provider]  lactulose (CHRONULAC) 10 GM/15ML solution Take 15 mLs by mouth daily as needed for mild constipation or moderate  constipation.  08/25/16  Yes [provider]  Liniments (BLUE-EMU SUPER STRENGTH) CREA Apply 1 application topically as needed (For cramping in hands or neck.).   Yes [provider]  LORazepam (ATIVAN) 1 MG tablet Take 1 mg by mouth every 8 (eight) hours as needed for anxiety.    Yes [provider]  methocarbamol (ROBAXIN) 500 MG tablet Take 1 tablet (500 mg total) by mouth 2 (two) times daily. Patient taking differently: Take 500 mg by mouth 2 (two) times daily as needed for muscle spasms.  11/09/16  Yes Audry Pili, PA-C  MILK THISTLE PO Take 1 tablet by mouth 3 (three) times daily.   Yes [provider]  morphine (MS CONTIN) 30 MG 12 hr tablet Take 30 mg by mouth 2 (two) times daily. 11/05/16  Yes [provider]  OVER THE COUNTER MEDICATION Take 2 capsules by mouth 2 (two) times daily. Nettles   Yes [provider]  oxyCODONE (ROXICODONE) 15 MG immediate release tablet Take 15 mg by mouth every 8 (eight) hours as needed for pain.   Yes [provider]  promethazine (PHENERGAN) 25 MG tablet Take 25 mg by mouth every 6 (six) hours as needed for nausea or vomiting.   Yes [provider]  spironolactone (ALDACTONE) 50 MG tablet Take 100 mg by mouth 2 (two) times daily.    Yes [provider]  sulfamethoxazole-trimethoprim (BACTRIM DS,SEPTRA DS) 800-160 MG tablet Take 1 tablet by mouth 2 (two) times daily. 01/02/17 01/09/17  Elvina Sidle, MD    Family History Family History  Problem Relation Age of Onset  . Diabetes Mother   . Cancer Father   . Diabetes Sister   . Diabetes Brother     Social History Social History  Substance Use Topics  . Smoking status: Current Every Day Smoker    Packs/day: 0.00    Types: Cigarettes  . Smokeless tobacco: Never Used  . Alcohol use Yes     Comment: occasionally     Allergies   Tizanidine   Review of Systems Review of Systems  Gastrointestinal: Positive for  abdominal distention and vomiting.  Genitourinary: Positive for frequency.  Musculoskeletal: Positive for back pain.  Neurological: Positive for weakness.  All other systems reviewed and are negative.    Physical Exam Triage Vital Signs ED Triage Vitals  Enc Vitals Group     BP      Pulse      Resp      Temp      Temp src      SpO2      Weight      Height      Head Circumference      Peak Flow      Pain Score      Pain Loc      Pain Edu?      Excl. in GC?    No data found.  Updated Vital Signs BP (!) 127/53 (BP Location: Right Arm)   Pulse 61   Temp 98.4 F (36.9 C) (Oral)   Resp 16   LMP 01/29/2013   SpO2 100%      Physical Exam  Constitutional: She is oriented to person, place, and time. She appears well-developed and well-nourished.  HENT:  Right Ear: External ear normal.  Left Ear: External ear normal.  Mouth/Throat: Oropharynx is clear and moist.  Eyes: Pupils are equal, round, and reactive to light. Conjunctivae are normal.  Neck: Normal range of motion. Neck supple.  Pulmonary/Chest: Effort normal.  Abdominal: Soft. She exhibits distension.  Ascites  Musculoskeletal: Normal range of motion.  Neurological: She is alert and oriented to person, place, and time.  Skin: Skin is warm and dry.  Sallow complexion  Nursing note and vitals reviewed.    UC Treatments / Results  Labs (all labs ordered are listed, but only abnormal results are displayed) Labs Reviewed  POCT URINALYSIS DIP (DEVICE) - Abnormal; Notable for the following:       Result Value   Bilirubin Urine MODERATE (*)    Protein, ur 100 (*)    Urobilinogen, UA 4.0 (*)    Leukocytes, UA LARGE (*)    All other components within normal limits  URINE CULTURE  URINE CYTOLOGY ANCILLARY ONLY    EKG  EKG Interpretation None       Radiology No results found.  Procedures Procedures (including critical care time)  Medications Ordered in UC Medications - No data to  display   Initial Impression / Assessment and Plan / UC Course  I have reviewed the triage vital signs and the nursing notes.  Pertinent labs & imaging results that were available during my care of the patient were reviewed by me and considered in my medical decision making (see chart for details).     Final Clinical Impressions(s) / UC Diagnoses   Final diagnoses:  Lower urinary tract infectious disease    New Prescriptions New Prescriptions   SULFAMETHOXAZOLE-TRIMETHOPRIM (BACTRIM DS,SEPTRA DS) 800-160 MG TABLET    Take 1 tablet by mouth 2 (two) times daily.     Controlled Substance Prescriptions [place] Controlled Substance Registry consulted? Not Applicable   Elvina Sidle, MD 01/02/17 1751

## 2017-01-03 LAB — URINE CYTOLOGY ANCILLARY ONLY
Chlamydia: NEGATIVE
Neisseria Gonorrhea: NEGATIVE
Trichomonas: NEGATIVE

## 2017-01-05 LAB — URINE CULTURE: Culture: >=100000 — AB

## 2017-01-05 LAB — URINE CYTOLOGY ANCILLARY ONLY
Bacterial vaginitis: POSITIVE — AB
Candida vaginitis: NEGATIVE

## 2017-01-07 ENCOUNTER — Encounter (HOSPITAL_COMMUNITY): Payer: Self-pay | Admitting: Family Medicine

## 2017-01-07 ENCOUNTER — Ambulatory Visit (HOSPITAL_COMMUNITY)
Admission: EM | Admit: 2017-01-07 | Discharge: 2017-01-07 | Disposition: A | Discharge status: Home / Self Care | Payer: Medicaid Other | Attending: Family Medicine | Admitting: Family Medicine

## 2017-01-07 DIAGNOSIS — B9689 Other specified bacterial agents as the cause of diseases classified elsewhere: Secondary | ICD-10-CM

## 2017-01-07 DIAGNOSIS — N12 Tubulo-interstitial nephritis, not specified as acute or chronic: Secondary | ICD-10-CM | POA: Diagnosis not present

## 2017-01-07 DIAGNOSIS — N10 Acute tubulo-interstitial nephritis: Secondary | ICD-10-CM | POA: Diagnosis not present

## 2017-01-07 DIAGNOSIS — N760 Acute vaginitis: Secondary | ICD-10-CM | POA: Diagnosis not present

## 2017-01-07 MED ORDER — ONDANSETRON 4 MG PO TBDP: 4.0000 mg | ORAL_TABLET | Freq: Three times a day (TID) | ORAL | 0 refills | Status: AC | PRN

## 2017-01-07 MED ORDER — CEPHALEXIN 500 MG PO CAPS: 500.0000 mg | ORAL_CAPSULE | Freq: Four times a day (QID) | ORAL | 0 refills | Status: DC

## 2017-01-07 MED ORDER — ONDANSETRON 4 MG PO TBDP
8.0000 mg | ORAL_TABLET | Freq: Once | ORAL | Status: AC
  Administered 2017-01-07: 8 mg via ORAL

## 2017-01-07 MED ORDER — METRONIDAZOLE 0.75 % VA GEL: 1.0000 | Freq: Two times a day (BID) | VAGINAL | 0 refills | Status: DC

## 2017-01-07 MED ORDER — CEFTRIAXONE SODIUM 1 G IJ SOLR
INTRAMUSCULAR | Status: AC
  Filled 2017-01-07: qty 10

## 2017-01-07 MED ORDER — PHENAZOPYRIDINE HCL 200 MG PO TABS: 200.0000 mg | ORAL_TABLET | Freq: Three times a day (TID) | ORAL | 0 refills | Status: DC | PRN

## 2017-01-07 MED ORDER — LIDOCAINE HCL 2 % IJ SOLN
INTRAMUSCULAR | Status: AC
  Filled 2017-01-07: qty 20

## 2017-01-07 MED ORDER — ONDANSETRON 4 MG PO TBDP
ORAL_TABLET | ORAL | Status: AC
  Filled 2017-01-07: qty 2

## 2017-01-07 MED ORDER — CEFTRIAXONE SODIUM 1 G IJ SOLR
1.0000 g | Freq: Once | INTRAMUSCULAR | Status: AC
  Administered 2017-01-07: 1 g via INTRAMUSCULAR

## 2017-01-07 NOTE — ED Triage Notes (Signed)
Pt here for UTI with worsening symptoms. sts that today she felt worse and started vomited.

## 2017-01-07 NOTE — Discharge Instructions (Signed)
The tests from your last visit indicate you had a UTI, was Escherichia coli. The sensitivities indicate that this particular germ was sensitive to many different types of antibiotics, however since been throwing up the Bactrim, will switch to Keflex. Also been given a shot of Rocephin in clinic as well, and Zofran for nausea. Additional testing indicated you also had an infection, bacterial vaginosis, I sent him a prescription for metronidazole gel, do 1 application vaginally twice daily for 5 days. If at any time your symptoms persist, or fail to resolve, go to the emergency room

## 2017-01-07 NOTE — ED Provider Notes (Signed)
[facility]ARE [facility]ENTER   [id number] 01/07/17 Arrival Time: 1136   SUBJECTIVE:  Katrina Cohen is a 51 y.o. female who presents to the urgent care  with complaint of dysuria, frequency, flank pain, nausea and vomiting. She was seen here in this clinic on 01/02/2017, diagnosed with the UTI, started on Bactrim and a urine culture was obtained. Urine culture grew Escherichia coli, sensitive to all antibiotics on the panel. The urine cytology also obtained, and was positive for bacterial vaginosis. States she has been unable to take the medication, as she is been vomiting frequently. Denies fever, but has had chills.  ROS: As per HPI, remainder of ROS negative.   OBJECTIVE:  Vitals:   01/07/17 1158  BP: (!) 149/78  Pulse: 80  Resp: 18  Temp: 98.4 F (36.9 C)  SpO2: 100%     General appearance: alert; no distress HEENT: normocephalic; atraumatic; conjunctivae normal;  Lungs: clear to auscultation bilaterally Heart: regular rate and rhythm Abdomen: soft, non-tender; bowel sounds normal; no masses or organomegaly; no guarding or rebound tenderness, right sided CVA tenderness Musculoskeletal/extremities: Pulses +2, grossly symmetrical, no dependent edema Skin: warm and dry Neurologic: Grossly normal Psychological:  alert and cooperative; normal mood and affect     ASSESSMENT & PLAN:  1. Pyelonephritis   2. BV (bacterial vaginosis)     Meds ordered this encounter  Medications  . ondansetron (ZOFRAN-ODT) disintegrating tablet 8 mg  . cefTRIAXone (ROCEPHIN) injection 1 g  . metroNIDAZOLE (METROGEL) 0.75 % vaginal gel    Sig: Place 1 Applicatorful vaginally 2 (two) times daily.    Dispense:  70 g    Refill:  0    Order Specific Question:   Supervising Provider    Answer:   Elvina Sidle [[id number]]  . cephALEXin (KEFLEX) 500 MG capsule    Sig: Take 1 capsule (500 mg total) by mouth 4 (four) times daily.    Dispense:  20 capsule    Refill:  0    Order Specific Question:    Supervising Provider    Answer:   Elvina Sidle [[id number]]  . phenazopyridine (PYRIDIUM) 200 MG tablet    Sig: Take 1 tablet (200 mg total) by mouth 3 (three) times daily as needed for pain.    Dispense:  10 tablet    Refill:  0    Order Specific Question:   Supervising Provider    Answer:   Elvina Sidle [[id number]]  . ondansetron (ZOFRAN ODT) 4 MG disintegrating tablet    Sig: Take 1 tablet (4 mg total) by mouth every 8 (eight) hours as needed for nausea or vomiting.    Dispense:  20 tablet    Refill:  0    Order Specific Question:   Supervising Provider    Answer:   Elvina Sidle [[id number]]    Discussed case with attending, will forego culture and UA, was given injection of Rocephin and Zofran here, and start on Keflex. Strict follow-up guidelines were provided, encouraged to go to the ER symptoms worsen. Also starting on intravaginal metronidazole as well. Reviewed expectations re: course of current medical issues. Questions answered. Outlined signs and symptoms indicating need for more acute intervention. Patient verbalized understanding. After Visit Summary given.    Procedures:     Results for orders placed or performed during the hospital encounter of 01/02/17  Urine culture  Result Value Ref Range   Specimen Description URINE, CLEAN CATCH    Special Requests NONE    Culture >=100,000 COLONIES/mL ESCHERICHIA COLI (  A)    Report Status 01/05/2017 FINAL    Organism ID, Bacteria ESCHERICHIA COLI (A)       Susceptibility   Escherichia coli - MIC*    AMPICILLIN <=2 SENSITIVE Sensitive     CEFAZOLIN <=4 SENSITIVE Sensitive     CEFTRIAXONE <=1 SENSITIVE Sensitive     CIPROFLOXACIN <=0.25 SENSITIVE Sensitive     GENTAMICIN <=1 SENSITIVE Sensitive     IMIPENEM <=0.25 SENSITIVE Sensitive     NITROFURANTOIN <=16 SENSITIVE Sensitive     TRIMETH/SULFA <=20 SENSITIVE Sensitive     AMPICILLIN/SULBACTAM <=2 SENSITIVE Sensitive     PIP/TAZO <=4 SENSITIVE Sensitive     Extended ESBL  NEGATIVE Sensitive     * >=100,000 COLONIES/mL ESCHERICHIA COLI  POCT urinalysis dip (device)  Result Value Ref Range   Glucose, UA NEGATIVE NEGATIVE mg/dL   Bilirubin Urine MODERATE (A) NEGATIVE   Ketones, ur NEGATIVE NEGATIVE mg/dL   Specific Gravity, Urine 1.020 1.005 - 1.030   Hgb urine dipstick NEGATIVE NEGATIVE   pH 7.0 5.0 - 8.0   Protein, ur 100 (A) NEGATIVE mg/dL   Urobilinogen, UA 4.0 (H) 0.0 - 1.0 mg/dL   Nitrite NEGATIVE NEGATIVE   Leukocytes, UA LARGE (A) NEGATIVE  Urine cytology ancillary only  Result Value Ref Range   Chlamydia Negative    Neisseria gonorrhea Negative    Trichomonas Negative   Urine cytology ancillary only  Result Value Ref Range   Bacterial vaginitis **POSITIVE for Atopobium vaginae** (A)    Candida vaginitis Negative for Candida Vaginitis Microorganisms     Labs Reviewed - No data to display  No results found.  Allergies  Allergen Reactions  . Tizanidine Other (See Comments)    Dizzy,falls    PMHx, SurgHx, SocialHx, Medications, and Allergies were reviewed in the Visit Navigator and updated as appropriate.       Dorena Bodo, NP 01/07/17 1217

## 2017-01-08 ENCOUNTER — Emergency Department (HOSPITAL_COMMUNITY)
Admission: EM | Admit: 2017-01-08 | Discharge: 2017-01-08 | Disposition: A | Discharge status: Home / Self Care | Payer: Medicaid Other | Attending: Emergency Medicine | Admitting: Emergency Medicine

## 2017-01-08 ENCOUNTER — Emergency Department (HOSPITAL_COMMUNITY): Payer: Medicaid Other

## 2017-01-08 ENCOUNTER — Encounter (HOSPITAL_COMMUNITY): Payer: Self-pay | Admitting: Emergency Medicine

## 2017-01-08 DIAGNOSIS — I10 Essential (primary) hypertension: Secondary | ICD-10-CM | POA: Insufficient documentation

## 2017-01-08 DIAGNOSIS — Z7984 Long term (current) use of oral hypoglycemic drugs: Secondary | ICD-10-CM | POA: Diagnosis not present

## 2017-01-08 DIAGNOSIS — F17210 Nicotine dependence, cigarettes, uncomplicated: Secondary | ICD-10-CM | POA: Insufficient documentation

## 2017-01-08 DIAGNOSIS — Z79899 Other long term (current) drug therapy: Secondary | ICD-10-CM | POA: Diagnosis not present

## 2017-01-08 DIAGNOSIS — R112 Nausea with vomiting, unspecified: Secondary | ICD-10-CM | POA: Diagnosis not present

## 2017-01-08 DIAGNOSIS — E119 Type 2 diabetes mellitus without complications: Secondary | ICD-10-CM | POA: Insufficient documentation

## 2017-01-08 DIAGNOSIS — K7460 Unspecified cirrhosis of liver: Secondary | ICD-10-CM | POA: Diagnosis not present

## 2017-01-08 DIAGNOSIS — R1031 Right lower quadrant pain: Secondary | ICD-10-CM | POA: Diagnosis not present

## 2017-01-08 DIAGNOSIS — R109 Unspecified abdominal pain: Secondary | ICD-10-CM

## 2017-01-08 LAB — CBC
HCT: 36 % (ref 36.0–46.0)
Hemoglobin: 12.6 g/dL (ref 12.0–15.0)
MCH: 30.1 pg (ref 26.0–34.0)
MCHC: 35 g/dL (ref 30.0–36.0)
MCV: 86.1 fL (ref 78.0–100.0)
PLATELETS: 76 10*3/uL — AB (ref 150–400)
RBC: 4.18 MIL/uL (ref 3.87–5.11)
RDW: 17.7 % — AB (ref 11.5–15.5)
WBC: 8.1 10*3/uL (ref 4.0–10.5)

## 2017-01-08 LAB — URINALYSIS, ROUTINE W REFLEX MICROSCOPIC
BILIRUBIN URINE: NEGATIVE
GLUCOSE, UA: NEGATIVE mg/dL
Hgb urine dipstick: NEGATIVE
KETONES UR: NEGATIVE mg/dL
Leukocytes, UA: NEGATIVE
NITRITE: NEGATIVE
PH: 8 (ref 5.0–8.0)
Protein, ur: NEGATIVE mg/dL
SPECIFIC GRAVITY, URINE: 1.024 (ref 1.005–1.030)

## 2017-01-08 LAB — COMPREHENSIVE METABOLIC PANEL
ALBUMIN: 3.3 g/dL — AB (ref 3.5–5.0)
ALT: 44 U/L (ref 14–54)
ANION GAP: 12 (ref 5–15)
AST: 68 U/L — ABNORMAL HIGH (ref 15–41)
Alkaline Phosphatase: 126 U/L (ref 38–126)
BILIRUBIN TOTAL: 9.1 mg/dL — AB (ref 0.3–1.2)
BUN: 19 mg/dL (ref 6–20)
CALCIUM: 9.1 mg/dL (ref 8.9–10.3)
CO2: 26 mmol/L (ref 22–32)
CREATININE: 1.05 mg/dL — AB (ref 0.44–1.00)
Chloride: 95 mmol/L — ABNORMAL LOW (ref 101–111)
GFR calc non Af Amer: >60 mL/min (ref >60)
GLUCOSE: 152 mg/dL — AB (ref 65–99)
POTASSIUM: 4.1 mmol/L (ref 3.5–5.1)
SODIUM: 133 mmol/L — AB (ref 135–145)
Total Protein: 7.2 g/dL (ref 6.5–8.1)

## 2017-01-08 LAB — LIPASE, BLOOD: Lipase: 184 U/L — ABNORMAL HIGH (ref 11–51)

## 2017-01-08 MED ORDER — ONDANSETRON HCL 4 MG/2ML IJ SOLN
4.0000 mg | Freq: Once | INTRAMUSCULAR | Status: AC | PRN
  Administered 2017-01-08: 4 mg via INTRAVENOUS
  Filled 2017-01-08: qty 2

## 2017-01-08 MED ORDER — IOPAMIDOL (ISOVUE-300) INJECTION 61%
100.0000 mL | Freq: Once | INTRAVENOUS | Status: AC | PRN
  Administered 2017-01-08: 100 mL via INTRAVENOUS

## 2017-01-08 MED ORDER — MORPHINE SULFATE (PF) 2 MG/ML IV SOLN
4.0000 mg | Freq: Once | INTRAVENOUS | Status: AC
  Administered 2017-01-08: 4 mg via INTRAVENOUS
  Filled 2017-01-08: qty 2

## 2017-01-08 MED ORDER — ONDANSETRON 4 MG PO TBDP: 4.0000 mg | ORAL_TABLET | Freq: Once | ORAL | Status: DC | PRN

## 2017-01-08 MED ORDER — ONDANSETRON 4 MG PO TBDP
ORAL_TABLET | ORAL | Status: AC
  Filled 2017-01-08: qty 1

## 2017-01-08 MED ORDER — IOPAMIDOL (ISOVUE-300) INJECTION 61%
INTRAVENOUS | Status: AC
  Filled 2017-01-08: qty 100

## 2017-01-08 MED ORDER — SODIUM CHLORIDE 0.9 % IV BOLUS (SEPSIS)
1000.0000 mL | Freq: Once | INTRAVENOUS | Status: AC
  Administered 2017-01-08: 1000 mL via INTRAVENOUS

## 2017-01-08 NOTE — Discharge Instructions (Signed)
Your lab work is reassuring. Her CT scan does show signs of urinary tract infection. Please continue taking her Keflex. Use the Zofran as needed. Use the pain medicine as needed. Drink plenty of fluids. Follow-up with her primary care doctor or urologist if symptoms are not improving. Return if your symptoms worsen including fevers, worsening pain, worsening vomiting.

## 2017-01-08 NOTE — ED Notes (Signed)
She is awake, alert and comfortable. 

## 2017-01-08 NOTE — ED Notes (Signed)
Asked for urine  

## 2017-01-08 NOTE — ED Provider Notes (Signed)
[facility]-EMERGENCY DEPT Provider Note   CSN: [id number] Arrival date & time: 01/08/17  0043     History   Chief Complaint Chief Complaint  Patient presents with  . Flank Pain  . Emesis    HPI Katrina Cohen is a 51 y.o. female.  HPI 51 year old Caucasian female past medical history significant for cirrhosis, diabetes, diabetes, bipolar disorder, depression that presents to the emergency Department today with complaints of urinary symptoms, emesis, flank pain. Patient states that on 8/12 she was seen for urinary symptoms at urgent care. At that time she was diagnosed with urinary tract infection. She was also diagnosed with bacterial vaginosis. The patient stated that she was prescribed Bactrim and vaginal Flagyl. The patient states that she was unable take the Bactrim pill because "it was too large". She only took one dose. Patient went back to urgent care yesterday to have her antibiotics changed. She was prescribed Keflex. She was also given a dose of Keflex at the urgent care. Patient states that she was undertaken 1 dose of Keflex. States the smell of this medication makes her nauseous. Patient reports no onset low back pain. States allergic care told her if she develops back pain come to the ED for evaluation. Patient denies any fevers. She denies any vaginal bleeding or vaginal discharge. Nothing makes the pain better or worse. She has not tried nothing for her pain. Describes it as the entire low back. Denies any abdominal pain.  Patient does report 2-3 episodes of nonbloody nonbilious emesis. Reports nausea.  Pt denies any fever, chill, ha, vision changes, lightheadedness, dizziness, congestion, neck pain, cp, sob, cough, abd pain, diarhea, change in bowel habits, melena, hematochezia, lower extremity paresthesias.  Past Medical History:  Diagnosis Date  . Bipolar disorder (HCC) 1980s   with depression, anxiety  . Cirrhosis (HCC)   . Depression with anxiety 1980s   [facility] admission  05/2011.    . Diabetes mellitus   . Dysphagia 11/2014   normal esophagram 11/2014. EGD per Dr Dulce Sellar.   . Hypertension   . Jaundice 04/2015   In setting of infectious mononucleosis. Ultrasound in [place]: hepatosplenomegaly, portal htn, possible cirrhosis  . Liver disease   . Mononucleosis 04/15/2015    Patient Active Problem List   Diagnosis Date Noted  . Right upper quadrant abdominal pain 04/02/2016  . UTI (urinary tract infection) 04/02/2016  . Cholelithiasis 04/02/2016  . Depression with anxiety 04/02/2016  . Chronic pain 04/02/2016  . SOB (shortness of breath) 11/12/2015  . COPD exacerbation (HCC) 11/12/2015  . Acute respiratory failure with hypoxia (HCC) 11/12/2015  . CAP (community acquired pneumonia) 11/07/2015  . Cirrhosis (HCC) 11/07/2015  . Thrombocytopenia (HCC) 11/07/2015  . Alcohol abuse 04/30/2015  . Acute hepatitis 04/30/2015  . Tobacco abuse 04/30/2015  . Malnutrition of moderate degree 04/29/2015  . Hypokalemia 04/29/2015  . Anemia 04/29/2015  . Hyponatremia 04/29/2015  . Coagulopathy (HCC) 04/29/2015  . Hypoalbuminemia 04/29/2015  . Mononucleosis 04/28/2015  . Vitamin D deficiency 06/11/2011  . Major depression (HCC) 06/10/2011    Past Surgical History:  Procedure Laterality Date  . APPENDECTOMY    . CESAREAN SECTION    . COLONOSCOPY    . ESOPHAGOGASTRODUODENOSCOPY     Dr Dulce Sellar ? 2016.   Gerald Leitz GENERIC HISTORICAL  04/20/2016   IR PARACENTESIS 04/20/2016 Darrell K Allred, PA-C [facility]-INTERV RAD    OB History    No data available       Home Medications    Prior to  Admission medications   Medication Sig Start Date End Date Taking? Authorizing Provider  albuterol (PROVENTIL HFA;VENTOLIN HFA) 108 (90 Base) MCG/ACT inhaler Inhale 2 puffs into the lungs every 4 (four) hours as needed for wheezing or shortness of breath.  09/02/15  Yes [provider]  albuterol (PROVENTIL) (2.5 MG/3ML) 0.083% nebulizer solution Take 3 mLs (2.5 mg total) by  nebulization every 4 (four) hours as needed for wheezing or shortness of breath. 11/13/15  Yes Standley Brooking, MD  ARIPiprazole (ABILIFY) 5 MG tablet Take 5 mg by mouth at bedtime.  04/02/15  Yes [provider]  buPROPion (WELLBUTRIN SR) 150 MG 12 hr tablet Take 150 mg by mouth 2 (two) times daily.  04/20/16 04/20/17 Yes [provider]  cephALEXin (KEFLEX) 500 MG capsule Take 1 capsule (500 mg total) by mouth 4 (four) times daily. 01/07/17  Yes Dorena Bodo, NP  citalopram (CELEXA) 20 MG tablet Take 20 mg by mouth at bedtime.    Yes [provider]  glyBURIDE (DIABETA) 2.5 MG tablet Take 2.5 mg by mouth daily with breakfast.  03/04/16 03/04/17 Yes [provider]  ibuprofen (ADVIL,MOTRIN) 200 MG tablet Take 200 mg by mouth every 6 (six) hours as needed for headache or mild pain.   Yes [provider]  Liniments (BLUE-EMU SUPER STRENGTH) CREA Apply 1 application topically as needed (For cramping in hands or neck.).   Yes [provider]  LORazepam (ATIVAN) 1 MG tablet Take 1 mg by mouth every 8 (eight) hours as needed for anxiety.    Yes [provider]  methocarbamol (ROBAXIN) 500 MG tablet Take 1 tablet (500 mg total) by mouth 2 (two) times daily. Patient taking differently: Take 500 mg by mouth 2 (two) times daily as needed for muscle spasms.  11/09/16  Yes Audry Pili, PA-C  metroNIDAZOLE (METROGEL) 0.75 % vaginal gel Place 1 Applicatorful vaginally 2 (two) times daily. 01/07/17  Yes Dorena Bodo, NP  MILK THISTLE PO Take 1 tablet by mouth 3 (three) times daily.   Yes [provider]  ondansetron (ZOFRAN ODT) 4 MG disintegrating tablet Take 1 tablet (4 mg total) by mouth every 8 (eight) hours as needed for nausea or vomiting. 01/07/17  Yes Dorena Bodo, NP  OVER THE COUNTER MEDICATION Take 2 capsules by mouth 2 (two) times daily. Nettles   Yes [provider]  oxyCODONE (ROXICODONE) 15 MG immediate  release tablet Take 15 mg by mouth every 8 (eight) hours as needed for pain.   Yes [provider]  phenazopyridine (PYRIDIUM) 200 MG tablet Take 1 tablet (200 mg total) by mouth 3 (three) times daily as needed for pain. 01/07/17  Yes Dorena Bodo, NP  promethazine (PHENERGAN) 25 MG tablet Take 25 mg by mouth every 6 (six) hours as needed for nausea or vomiting.   Yes [provider]  spironolactone (ALDACTONE) 50 MG tablet Take 100 mg by mouth 2 (two) times daily.    Yes [provider]  Vitamin D, Ergocalciferol, (DRISDOL) 50000 units CAPS capsule Take 50,000 Units by mouth 2 (two) times a week. 12/23/16 12/23/17 Yes [provider]  morphine (MS CONTIN) 30 MG 12 hr tablet Take 30 mg by mouth 2 (two) times daily. 11/05/16   [provider]    Family History Family History  Problem Relation Age of Onset  . Diabetes Mother   . Cancer Father   . Diabetes Sister   . Diabetes Brother     Social History Social  History  Substance Use Topics  . Smoking status: Current Every Day Smoker    Packs/day: 0.00    Types: Cigarettes  . Smokeless tobacco: Never Used  . Alcohol use Yes     Comment: occasionally     Allergies   Tizanidine   Review of Systems Review of Systems  Constitutional: Negative for chills and fever.  HENT: Negative for congestion.   Eyes: Negative for visual disturbance.  Respiratory: Negative for cough and shortness of breath.   Cardiovascular: Negative for chest pain.  Gastrointestinal: Positive for nausea and vomiting. Negative for abdominal pain and diarrhea.  Genitourinary: Positive for dysuria, flank pain, frequency and urgency. Negative for hematuria, vaginal bleeding and vaginal discharge.  Musculoskeletal: Negative for arthralgias and myalgias.  Skin: Negative for rash.  Neurological: Negative for dizziness, syncope, weakness, light-headedness, numbness and headaches.  Psychiatric/Behavioral: Negative for sleep  disturbance. The patient is not nervous/anxious.      Physical Exam Updated Vital Signs BP (!) 156/69   Pulse 64   Temp 98.6 F (37 C) (Oral)   Resp 18   Ht 5\' 4"  (1.626 m)   Wt 68.9 kg (152 lb)   LMP 01/29/2013   SpO2 99%   BMI 26.09 kg/m   Physical Exam  Constitutional: She is oriented to person, place, and time. She appears well-developed and well-nourished.  Non-toxic appearance. No distress.  HENT:  Head: Normocephalic and atraumatic.  Nose: Nose normal.  Mouth/Throat: Oropharynx is clear and moist.  Eyes: Pupils are equal, round, and reactive to light. Conjunctivae are normal. Right eye exhibits no discharge. Left eye exhibits no discharge.  Neck: Normal range of motion. Neck supple.  Cardiovascular: Normal rate, regular rhythm, normal heart sounds and intact distal pulses.  Exam reveals no gallop and no friction rub.   No murmur heard. Pulmonary/Chest: Effort normal and breath sounds normal. No respiratory distress. She exhibits no tenderness.  Abdominal: Soft. Bowel sounds are normal. There is no tenderness. There is CVA tenderness (bilatearl). There is no rigidity, no rebound, no guarding, no tenderness at McBurney's point and negative Murphy's sign.  Musculoskeletal: Normal range of motion. She exhibits no tenderness.  Lymphadenopathy:    She has no cervical adenopathy.  Neurological: She is alert and oriented to person, place, and time.  Skin: Skin is warm and dry. Capillary refill takes less than 2 seconds.  Psychiatric: Her behavior is normal. Judgment and thought content normal.  Nursing note and vitals reviewed.    ED Treatments / Results  Labs (all labs ordered are listed, but only abnormal results are displayed) Labs Reviewed  URINALYSIS, ROUTINE W REFLEX MICROSCOPIC - Abnormal; Notable for the following:       Result Value   Color, Urine AMBER (*)    All other components within normal limits  LIPASE, BLOOD - Abnormal; Notable for the following:     Lipase 184 (*)    All other components within normal limits  COMPREHENSIVE METABOLIC PANEL - Abnormal; Notable for the following:    Sodium 133 (*)    Chloride 95 (*)    Glucose, Bld 152 (*)    Creatinine, Ser 1.05 (*)    Albumin 3.3 (*)    AST 68 (*)    Total Bilirubin 9.1 (*)    All other components within normal limits  CBC - Abnormal; Notable for the following:    RDW 17.7 (*)    Platelets 76 (*)    All other components within normal limits  EKG  EKG Interpretation None       Radiology Ct Abdomen Pelvis W Contrast  Result Date: 01/08/2017 CLINICAL DATA:  Right flank pain. Vomiting. Symptoms beginning last week. EXAM: CT ABDOMEN AND PELVIS WITH CONTRAST TECHNIQUE: Multidetector CT imaging of the abdomen and pelvis was performed using the standard protocol following bolus administration of intravenous contrast. CONTRAST:  ISOVUE-300 IOPAMIDOL (ISOVUE-300) INJECTION 61% COMPARISON:  None. FINDINGS: Lower chest: Clear lung bases.  Heart normal size. Hepatobiliary: Liver demonstrates surface nodularity relative enlargement of the caudate lobe and central volume loss. No liver mass or focal lesion. No bile duct dilation. Gallbladder wall appears mildly thickened. There is no pericholecystic inflammation. No bile duct dilation. Pancreas: Unremarkable. No pancreatic ductal dilatation or surrounding inflammatory changes. Spleen: Enlarged measuring 19 x 8.5 x 14 cm. No splenic mass or focal lesion. Adrenals/Urinary Tract: No renal masses, stones or hydronephrosis. Symmetric renal enhancement and excretion. Normal ureters. Bladder wall is mildly thickened. No bladder mass or stone. Stomach/Bowel: Stomach, small bowel and colon are unremarkable. Vascular/Lymphatic: No significant vascular findings are present. No enlarged abdominal or pelvic lymph nodes. Reproductive: Uterus and bilateral adnexa are unremarkable. Other: Trace amount ascites is seen adjacent to the liver. No hernia.  Musculoskeletal: No acute or significant osseous findings. IMPRESSION: 1. Mild bladder wall thickening. This may reflect cystitis in warrants clinical correlation. 2. No other evidence of an acute abnormality. 3. Findings are consistent with cirrhosis and portal venous hypertension, the latter reflected by a trace amount of ascites and splenomegaly. Electronically Signed   By: Amie Portland M.D.   On: 01/08/2017 07:37    Procedures Procedures (including critical care time)  Medications Ordered in ED Medications  ondansetron (ZOFRAN-ODT) disintegrating tablet 4 mg ( Oral Not Given 01/08/17 0606)  iopamidol (ISOVUE-300) 61 % injection (not administered)  ondansetron (ZOFRAN) injection 4 mg (4 mg Intravenous Given 01/08/17 0606)  sodium chloride 0.9 % bolus 1,000 mL (0 mLs Intravenous Stopped 01/08/17 0856)  morphine 2 MG/ML injection 4 mg (4 mg Intravenous Given 01/08/17 0656)  iopamidol (ISOVUE-300) 61 % injection 100 mL (100 mLs Intravenous Contrast Given 01/08/17 0714)     Initial Impression / Assessment and Plan / ED Course  I have reviewed the triage vital signs and the nursing notes.  Pertinent labs & imaging results that were available during my care of the patient were reviewed by me and considered in my medical decision making (see chart for details).     Patient presents to the ED with ongoing urinary symptoms, emesis, bilateral flank pain. Patient has been seen at urgent care twice in the past week for urinary tract infection. Unable to tolerate Bactrim due to the size of the pill. States that she was prescribed Keflex yesterday but the smell of medication makes her vomit. States that she has developed some low back pain which was told to come to the ED if she developed for evaluation.  Patient is overall well-appearing. Vital signs are reassuring. She is afebrile, no tachycardia or hypotension.  Patient with bilateral CVA tenderness. Diffuse abdominal tenderness but no focal area  concerning for acute peritonitis.  No leukocytosis is noted. Mild hyperglycemia 152. Is normal. Doubt DKA. Lipase is mildly elevated at 184 which seems to be baseline for patient. Liver enzymes also elevated which are baseline for patient given history of cirrhosis. Probable pain and noted which is baseline for patient. UA shows no signs of infection at this time.  CT scan was obtained that shows signs  of acute cystitis but no other acute on amount as including pancreatitis or cholecystitis noted.  Patient's pain has been controlled in the ED. She is able tolerate by mouth fluids or any difficulties. Encourage patient to continue taking her Keflex at home. She has pain medicine and nausea medicine as needed. Vital signs again are reassuring without any fever or signs of SEPSIS.  Repeat abdominal exam is benign and shows no signs of peritonitis. No CVA tenderness noted on repeat exam.  Pt is hemodynamically stable, in NAD, & able to ambulate in the ED. Evaluation does not show pathology that would require ongoing emergent intervention or inpatient treatment. I explained the diagnosis to the patient. Pain has been managed & has no complaints prior to dc. Pt is comfortable with above plan and is stable for discharge at this time. All questions were answered prior to disposition. Strict return precautions for f/u to the ED were discussed. Encouraged follow up with PCP.   Final Clinical Impressions(s) / ED Diagnoses   Final diagnoses:  Flank pain  Nausea and vomiting, intractability of vomiting not specified, unspecified vomiting type    New Prescriptions New Prescriptions   No medications on file     Wallace Keller 01/08/17 0920    Rolland Porter, MD 01/16/17 [phone]

## 2017-01-08 NOTE — ED Triage Notes (Signed)
Patient went to urgent care and they told her to come to ER cause they could not do anything else for her per patient. Patient is complaining of right flank. Patient complaining of vomiting. Patient states it started Friday Dec 31, 2016.

## 2017-01-18 ENCOUNTER — Encounter (HOSPITAL_COMMUNITY): Payer: Self-pay | Admitting: Emergency Medicine

## 2017-01-18 ENCOUNTER — Inpatient Hospital Stay (HOSPITAL_COMMUNITY)
Admission: EM | Admit: 2017-01-18 | Discharge: 2017-01-21 | DRG: 442 | Disposition: A | Discharge status: Hospice | Payer: Medicaid Other | Attending: Internal Medicine | Admitting: Internal Medicine

## 2017-01-18 DIAGNOSIS — E119 Type 2 diabetes mellitus without complications: Secondary | ICD-10-CM | POA: Diagnosis present

## 2017-01-18 DIAGNOSIS — F319 Bipolar disorder, unspecified: Secondary | ICD-10-CM | POA: Diagnosis present

## 2017-01-18 DIAGNOSIS — K7031 Alcoholic cirrhosis of liver with ascites: Secondary | ICD-10-CM | POA: Diagnosis present

## 2017-01-18 DIAGNOSIS — K7290 Hepatic failure, unspecified without coma: Secondary | ICD-10-CM | POA: Diagnosis present

## 2017-01-18 DIAGNOSIS — K766 Portal hypertension: Secondary | ICD-10-CM | POA: Diagnosis present

## 2017-01-18 DIAGNOSIS — N3001 Acute cystitis with hematuria: Secondary | ICD-10-CM | POA: Diagnosis present

## 2017-01-18 DIAGNOSIS — Z79891 Long term (current) use of opiate analgesic: Secondary | ICD-10-CM

## 2017-01-18 DIAGNOSIS — Z7189 Other specified counseling: Secondary | ICD-10-CM

## 2017-01-18 DIAGNOSIS — F1020 Alcohol dependence, uncomplicated: Secondary | ICD-10-CM | POA: Diagnosis present

## 2017-01-18 DIAGNOSIS — F419 Anxiety disorder, unspecified: Secondary | ICD-10-CM | POA: Diagnosis present

## 2017-01-18 DIAGNOSIS — G9340 Encephalopathy, unspecified: Secondary | ICD-10-CM | POA: Diagnosis not present

## 2017-01-18 DIAGNOSIS — K7460 Unspecified cirrhosis of liver: Secondary | ICD-10-CM | POA: Diagnosis present

## 2017-01-18 DIAGNOSIS — I10 Essential (primary) hypertension: Secondary | ICD-10-CM | POA: Diagnosis present

## 2017-01-18 DIAGNOSIS — Z7984 Long term (current) use of oral hypoglycemic drugs: Secondary | ICD-10-CM

## 2017-01-18 DIAGNOSIS — Z515 Encounter for palliative care: Secondary | ICD-10-CM | POA: Diagnosis present

## 2017-01-18 DIAGNOSIS — D6959 Other secondary thrombocytopenia: Secondary | ICD-10-CM | POA: Diagnosis present

## 2017-01-18 DIAGNOSIS — Z888 Allergy status to other drugs, medicaments and biological substances status: Secondary | ICD-10-CM

## 2017-01-18 DIAGNOSIS — F17210 Nicotine dependence, cigarettes, uncomplicated: Secondary | ICD-10-CM | POA: Diagnosis present

## 2017-01-18 DIAGNOSIS — K7682 Hepatic encephalopathy: Secondary | ICD-10-CM

## 2017-01-18 DIAGNOSIS — D696 Thrombocytopenia, unspecified: Secondary | ICD-10-CM | POA: Diagnosis present

## 2017-01-18 DIAGNOSIS — Z79899 Other long term (current) drug therapy: Secondary | ICD-10-CM

## 2017-01-18 LAB — CBC
HEMATOCRIT: 31.9 % — AB (ref 36.0–46.0)
Hemoglobin: 10.9 g/dL — ABNORMAL LOW (ref 12.0–15.0)
MCH: 30.5 pg (ref 26.0–34.0)
MCHC: 34.2 g/dL (ref 30.0–36.0)
MCV: 89.4 fL (ref 78.0–100.0)
PLATELETS: 113 10*3/uL — AB (ref 150–400)
RBC: 3.57 MIL/uL — ABNORMAL LOW (ref 3.87–5.11)
RDW: 17.4 % — AB (ref 11.5–15.5)
WBC: 5 10*3/uL (ref 4.0–10.5)

## 2017-01-18 LAB — COMPREHENSIVE METABOLIC PANEL
ALBUMIN: 3.1 g/dL — AB (ref 3.5–5.0)
ALK PHOS: 145 U/L — AB (ref 38–126)
ALT: 30 U/L (ref 14–54)
AST: 64 U/L — AB (ref 15–41)
Anion gap: 7 (ref 5–15)
BILIRUBIN TOTAL: 6.1 mg/dL — AB (ref 0.3–1.2)
BUN: 9 mg/dL (ref 6–20)
CALCIUM: 8.5 mg/dL — AB (ref 8.9–10.3)
CO2: 22 mmol/L (ref 22–32)
CREATININE: 0.54 mg/dL (ref 0.44–1.00)
Chloride: 105 mmol/L (ref 101–111)
GFR calc Af Amer: >60 mL/min (ref >60)
GLUCOSE: 206 mg/dL — AB (ref 65–99)
POTASSIUM: 4.2 mmol/L (ref 3.5–5.1)
Sodium: 134 mmol/L — ABNORMAL LOW (ref 135–145)
TOTAL PROTEIN: 6.3 g/dL — AB (ref 6.5–8.1)

## 2017-01-18 LAB — URINALYSIS, ROUTINE W REFLEX MICROSCOPIC
BILIRUBIN URINE: NEGATIVE
Glucose, UA: NEGATIVE mg/dL
Ketones, ur: NEGATIVE mg/dL
NITRITE: POSITIVE — AB
PH: 5 (ref 5.0–8.0)
Protein, ur: 100 mg/dL — AB
SPECIFIC GRAVITY, URINE: 1.011 (ref 1.005–1.030)

## 2017-01-18 LAB — AMMONIA: AMMONIA: 88 umol/L — AB (ref 9–35)

## 2017-01-18 LAB — LIPASE, BLOOD: Lipase: 61 U/L — ABNORMAL HIGH (ref 11–51)

## 2017-01-18 MED ORDER — CITALOPRAM HYDROBROMIDE 10 MG PO TABS
20.0000 mg | ORAL_TABLET | Freq: Every day | ORAL | Status: DC
  Administered 2017-01-19 – 2017-01-20 (×2): 20 mg via ORAL
  Filled 2017-01-18 (×2): qty 2

## 2017-01-18 MED ORDER — ACETAMINOPHEN 325 MG PO TABS
650.0000 mg | ORAL_TABLET | Freq: Four times a day (QID) | ORAL | Status: DC | PRN
  Filled 2017-01-18: qty 2

## 2017-01-18 MED ORDER — ONDANSETRON HCL 4 MG/2ML IJ SOLN: 4.0000 mg | Freq: Four times a day (QID) | INTRAMUSCULAR | Status: DC | PRN

## 2017-01-18 MED ORDER — LACTULOSE 10 GM/15ML PO SOLN
30.0000 g | Freq: Three times a day (TID) | ORAL | Status: DC
  Administered 2017-01-19 – 2017-01-21 (×5): 30 g via ORAL
  Filled 2017-01-18 (×7): qty 45

## 2017-01-18 MED ORDER — SODIUM CHLORIDE 0.9 % IV BOLUS (SEPSIS)
1000.0000 mL | Freq: Once | INTRAVENOUS | Status: AC
  Administered 2017-01-18: 1000 mL via INTRAVENOUS

## 2017-01-18 MED ORDER — ALBUTEROL SULFATE HFA 108 (90 BASE) MCG/ACT IN AERS: 2.0000 | INHALATION_SPRAY | RESPIRATORY_TRACT | Status: DC | PRN

## 2017-01-18 MED ORDER — LACTULOSE 10 GM/15ML PO SOLN
30.0000 g | Freq: Once | ORAL | Status: AC
  Administered 2017-01-18: 30 g via ORAL
  Filled 2017-01-18: qty 60

## 2017-01-18 MED ORDER — SPIRONOLACTONE 100 MG PO TABS
100.0000 mg | ORAL_TABLET | Freq: Two times a day (BID) | ORAL | Status: DC
  Administered 2017-01-19 – 2017-01-21 (×5): 100 mg via ORAL
  Filled 2017-01-18: qty 4
  Filled 2017-01-18 (×4): qty 1
  Filled 2017-01-18: qty 4
  Filled 2017-01-18: qty 1
  Filled 2017-01-18: qty 4
  Filled 2017-01-18: qty 1
  Filled 2017-01-18 (×2): qty 4

## 2017-01-18 MED ORDER — DEXTROSE 5 % IV SOLN
1.0000 g | INTRAVENOUS | Status: DC
  Administered 2017-01-19 – 2017-01-20 (×2): 1 g via INTRAVENOUS
  Filled 2017-01-18 (×3): qty 10

## 2017-01-18 MED ORDER — BUPROPION HCL ER (SR) 150 MG PO TB12
150.0000 mg | ORAL_TABLET | Freq: Two times a day (BID) | ORAL | Status: DC
  Administered 2017-01-19 – 2017-01-21 (×5): 150 mg via ORAL
  Filled 2017-01-18 (×5): qty 1

## 2017-01-18 MED ORDER — ARIPIPRAZOLE 5 MG PO TABS
5.0000 mg | ORAL_TABLET | Freq: Every day | ORAL | Status: DC
  Administered 2017-01-19 – 2017-01-20 (×2): 5 mg via ORAL
  Filled 2017-01-18 (×2): qty 1

## 2017-01-18 MED ORDER — ACETAMINOPHEN 650 MG RE SUPP: 650.0000 mg | Freq: Four times a day (QID) | RECTAL | Status: DC | PRN

## 2017-01-18 MED ORDER — ADULT MULTIVITAMIN W/MINERALS CH
2.0000 | ORAL_TABLET | Freq: Every day | ORAL | Status: DC
  Administered 2017-01-19 – 2017-01-21 (×3): 2 via ORAL
  Filled 2017-01-18 (×3): qty 2

## 2017-01-18 MED ORDER — CEFTRIAXONE SODIUM 1 G IJ SOLR
1.0000 g | Freq: Once | INTRAMUSCULAR | Status: AC
  Administered 2017-01-18: 1 g via INTRAVENOUS
  Filled 2017-01-18 (×3): qty 10

## 2017-01-18 MED ORDER — ONDANSETRON HCL 4 MG PO TABS: 4.0000 mg | ORAL_TABLET | Freq: Four times a day (QID) | ORAL | Status: DC | PRN

## 2017-01-18 MED ORDER — ALBUTEROL SULFATE (2.5 MG/3ML) 0.083% IN NEBU: 2.5000 mg | INHALATION_SOLUTION | RESPIRATORY_TRACT | Status: DC | PRN

## 2017-01-18 MED ORDER — METHOCARBAMOL 500 MG PO TABS: 500.0000 mg | ORAL_TABLET | Freq: Two times a day (BID) | ORAL | Status: DC | PRN

## 2017-01-18 NOTE — Progress Notes (Signed)
Pharmacy Antibiotic Note  Arshia Nunley is a 51 y.o. female admitted on 01/18/2017 with UTI.  Pharmacy has been consulted for Rocephin dosing.  Plan: Rocephin 1 gm IV q24 Pharmacy to sign off  Height: 5\' 4"  (162.6 cm) Weight: 153 lb (69.4 kg) IBW/kg (Calculated) : 54.7  Temp (24hrs), Avg:97.7 F (36.5 C), Min:97.7 F (36.5 C), Max:97.7 F (36.5 C)   Recent Labs Lab 01/18/17 1923  WBC 5.0  CREATININE 0.54    Estimated Creatinine Clearance: 79.6 mL/min (by C-G formula based on SCr of 0.54 mg/dL).    Allergies  Allergen Reactions  . Tizanidine Other (See Comments)    Dizzy,falls    Thank you for allowing pharmacy to be a part of this patient's care. Herby Abraham, Pharm.D. [phone] 01/18/2017 9:59 PM

## 2017-01-18 NOTE — ED Notes (Signed)
Call report to Denny Peon, RN at 22:15    [phone]

## 2017-01-18 NOTE — ED Provider Notes (Signed)
[facility]-EMERGENCY DEPT Provider Note   CSN: [id number] Arrival date & time: 01/18/17  1750     History   Chief Complaint Chief Complaint  Patient presents with  . Fatigue  . elevated ammonia  . Dysuria    HPI Katrina Cohen is a 51 y.o. female.  CC: fatigue  Onset/Duration: 5 days Timing: constant, worsening Location: generalized Severity: moderate Modifying Factors:  Improved by: nothing  Worsened by: nothing Associated Signs/Symptoms:  Pertinent (+): abd discomfort for 1 weeks, stable and unchange. Dysuria for 2 days.   Pertinent (-): fevers, chills, CP, SOB, nausea, emesis, cough. Context: h/o EtOH cirrhosis on lactulose. No BM today. PCP saw pt on Fri who got labs; pt called yesterday and told that ammonia was elevated. If she was more lethargic, to go to ER.    The history is provided by the patient.    Past Medical History:  Diagnosis Date  . Bipolar disorder (HCC) 1980s   with depression, anxiety  . Cirrhosis (HCC)   . Depression with anxiety 1980s   [facility] admission 05/2011.    . Diabetes mellitus   . Dysphagia 11/2014   normal esophagram 11/2014. EGD per Dr Dulce Sellar.   . Hypertension   . Jaundice 04/2015   In setting of infectious mononucleosis. Ultrasound in [place]: hepatosplenomegaly, portal htn, possible cirrhosis  . Liver disease   . Mononucleosis 04/15/2015    Patient Active Problem List   Diagnosis Date Noted  . Acute encephalopathy 01/18/2017  . Right upper quadrant abdominal pain 04/02/2016  . UTI (urinary tract infection) 04/02/2016  . Cholelithiasis 04/02/2016  . Depression with anxiety 04/02/2016  . Chronic pain 04/02/2016  . SOB (shortness of breath) 11/12/2015  . COPD exacerbation (HCC) 11/12/2015  . Acute respiratory failure with hypoxia (HCC) 11/12/2015  . CAP (community acquired pneumonia) 11/07/2015  . Cirrhosis (HCC) 11/07/2015  . Thrombocytopenia (HCC) 11/07/2015  . Alcohol abuse 04/30/2015  . Acute hepatitis 04/30/2015  .  Tobacco abuse 04/30/2015  . Malnutrition of moderate degree 04/29/2015  . Hypokalemia 04/29/2015  . Anemia 04/29/2015  . Hyponatremia 04/29/2015  . Coagulopathy (HCC) 04/29/2015  . Hypoalbuminemia 04/29/2015  . Mononucleosis 04/28/2015  . Vitamin D deficiency 06/11/2011  . Major depression (HCC) 06/10/2011    Past Surgical History:  Procedure Laterality Date  . APPENDECTOMY    . CESAREAN SECTION    . COLONOSCOPY    . ESOPHAGOGASTRODUODENOSCOPY     Dr Dulce Sellar ? 2016.   . IR GENERIC HISTORICAL  04/20/2016   IR PARACENTESIS 04/20/2016 Darrell K Allred, PA-C [facility]-INTER[facility] RAD    OB History    No data available       Home Medications    Prior to Admission medications   Medication Sig Start Date End Date Taking? Authorizing Provider  ARIPiprazole (ABILIFY) 5 MG tablet Take 5 mg by mouth at bedtime.  04/02/15  Yes [provider]  buPROPion (WELLBUTRIN SR) 150 MG 12 hr tablet Take 150 mg by mouth 2 (two) times daily.  04/20/16 04/20/17 Yes [provider]  citalopram (CELEXA) 20 MG tablet Take 20 mg by mouth at bedtime.    Yes [provider]  glyBURIDE (DIABETA) 2.5 MG tablet Take 2.5 mg by mouth daily as needed (high blood sugar).  03/04/16 03/04/17 Yes [provider]  ibuprofen (ADVIL,MOTRIN) 200 MG tablet Take 200 mg by mouth every 6 (six) hours as needed for headache or mild pain.   Yes [provider]  lactulose (CHRONULAC) 10 GM/15ML solution  TAKE 25 ML BY MOUTH EVERY 1-2 HOURS TO PRODUCE 2 SOFT BOWEL MOVEMENTS. INCREASE DAILY TO PRODUCE 2-3 01/14/17  Yes [provider]  LORazepam (ATIVAN) 1 MG tablet Take 1 mg by mouth every 8 (eight) hours as needed for anxiety.    Yes [provider]  MILK THISTLE PO Take 1 tablet by mouth 3 (three) times daily.   Yes [provider]  morphine (MS CONTIN) 30 MG 12 hr tablet Take 30 mg by mouth 2 (two) times daily. 11/05/16  Yes [provider]  Multiple  Vitamins-Minerals (MULTIVITAMIN ADULTS) TABS Take 2 tablets by mouth daily.   Yes [provider]  OVER THE COUNTER MEDICATION Take 2 capsules by mouth 2 (two) times daily. [place]   Yes [provider]  oxyCODONE (ROXICODONE) 15 MG immediate release tablet Take 15 mg by mouth every 8 (eight) hours as needed for pain.   Yes [provider]  spironolactone (ALDACTONE) 50 MG tablet Take 100 mg by mouth 2 (two) times daily.    Yes [provider]  Vitamin D, Ergocalciferol, (DRISDOL) 50000 units CAPS capsule Take 50,000 Units by mouth 2 (two) times a week. 12/23/16 12/23/17 Yes [provider]  albuterol (PROVENTIL HFA;VENTOLIN HFA) 108 (90 Base) MCG/ACT inhaler Inhale 2 puffs into the lungs every 4 (four) hours as needed for wheezing or shortness of breath.  09/02/15   [provider]  albuterol (PROVENTIL) (2.5 MG/3ML) 0.083% nebulizer solution Take 3 mLs (2.5 mg total) by nebulization every 4 (four) hours as needed for wheezing or shortness of breath. 11/13/15   Standley Brooking, MD  cephALEXin (KEFLEX) 500 MG capsule Take 1 capsule (500 mg total) by mouth 4 (four) times daily. Patient not taking: Reported on 01/18/2017 01/07/17   Dorena Bodo, NP  Liniments (BLUE-EMU SUPER STRENGTH) CREA Apply 1 application topically as needed (For cramping in hands or neck.).    [provider]  methocarbamol (ROBAXIN) 500 MG tablet Take 1 tablet (500 mg total) by mouth 2 (two) times daily. Patient taking differently: Take 500 mg by mouth 2 (two) times daily as needed for muscle spasms.  11/09/16   Audry Pili, PA-C  metroNIDAZOLE (METROGEL) 0.75 % vaginal gel Place 1 Applicatorful vaginally 2 (two) times daily. 01/07/17   Dorena Bodo, NP  ondansetron (ZOFRAN ODT) 4 MG disintegrating tablet Take 1 tablet (4 mg total) by mouth every 8 (eight) hours as needed for nausea or vomiting. 01/07/17   Dorena Bodo, NP  phenazopyridine (PYRIDIUM) 200 MG  tablet Take 1 tablet (200 mg total) by mouth 3 (three) times daily as needed for pain. 01/07/17   Dorena Bodo, NP  promethazine (PHENERGAN) 25 MG tablet Take 25 mg by mouth every 6 (six) hours as needed for nausea or vomiting.    [provider]    Family History Family History  Problem Relation Age of Onset  . Diabetes Mother   . Cancer Father   . Diabetes Sister   . Diabetes Brother     Social History Social History  Substance Use Topics  . Smoking status: Current Every Day Smoker    Packs/day: 0.50    Types: Cigarettes  . Smokeless tobacco: Never Used  . Alcohol use No     Comment: sober for over 1 year     Allergies   Tizanidine   Review of Systems Review of Systems All other systems are reviewed and are negative for acute change except as noted in the HPI  Physical Exam Updated Vital Signs Ht 5\' 4"  (1.626 m)   Wt 69.4 kg (153 lb)   LMP 01/29/2013   BMI 26.26 kg/m   Physical Exam  Constitutional: She is oriented to person, place, and time. She appears well-developed and well-nourished. She appears lethargic. No distress.  HENT:  Head: Normocephalic and atraumatic.  Nose: Nose normal.  Eyes: Pupils are equal, round, and reactive to light. Conjunctivae and EOM are normal. Right eye exhibits no discharge. Left eye exhibits no discharge. No scleral icterus.  Neck: Normal range of motion. Neck supple.  Cardiovascular: Normal rate and regular rhythm.  Exam reveals no gallop and no friction rub.   No murmur heard. Pulmonary/Chest: Effort normal and breath sounds normal. No stridor. No respiratory distress. She has no rales.  Abdominal: Soft. She exhibits distension and ascites. There is no tenderness. There is no rigidity, no rebound and no guarding.  Musculoskeletal: She exhibits no edema or tenderness.  Neurological: She is oriented to person, place, and time. She appears lethargic.  Skin: Skin is warm and dry. No rash noted. She is not  diaphoretic. No erythema.  Psychiatric: She has a normal mood and affect.  Vitals reviewed.    ED Treatments / Results  Labs (all labs ordered are listed, but only abnormal results are displayed) Labs Reviewed  AMMONIA - Abnormal; Notable for the following:       Result Value   Ammonia 88 (*)    All other components within normal limits  LIPASE, BLOOD - Abnormal; Notable for the following:    Lipase 61 (*)    All other components within normal limits  COMPREHENSIVE METABOLIC PANEL - Abnormal; Notable for the following:    Sodium 134 (*)    Glucose, Bld 206 (*)    Calcium 8.5 (*)    Total Protein 6.3 (*)    Albumin 3.1 (*)    AST 64 (*)    Alkaline Phosphatase 145 (*)    Total Bilirubin 6.1 (*)    All other components within normal limits  CBC - Abnormal; Notable for the following:    RBC 3.57 (*)    Hemoglobin 10.9 (*)    HCT 31.9 (*)    RDW 17.4 (*)    Platelets 113 (*)    All other components within normal limits  URINALYSIS, ROUTINE W REFLEX MICROSCOPIC - Abnormal; Notable for the following:    Color, Urine AMBER (*)    APPearance TURBID (*)    Hgb urine dipstick SMALL (*)    Protein, ur 100 (*)    Nitrite POSITIVE (*)    Leukocytes, UA LARGE (*)    Bacteria, UA MANY (*)    Squamous Epithelial / LPF 6-30 (*)    All other components within normal limits  CULTURE, BLOOD (ROUTINE X 2)  CULTURE, BLOOD (ROUTINE X 2)  HIV ANTIBODY (ROUTINE TESTING)  BASIC METABOLIC PANEL  CBC  HEPATIC FUNCTION PANEL  AMMONIA    EKG  EKG Interpretation None       Radiology No results found.  Procedures Procedures (including critical care time)  Medications Ordered in ED Medications  multivitamin with minerals tablet 2 tablet (not administered)  methocarbamol (ROBAXIN) tablet 500 mg (not administered)  albuterol (PROVENTIL) (2.5 MG/3ML) 0.083% nebulizer solution 2.5 mg (not administered)  spironolactone (ALDACTONE) tablet 100 mg (100 mg Oral Not Given 01/18/17 2248)    citalopram (CELEXA) tablet 20 mg (not administered)  buPROPion (WELLBUTRIN SR) 12 hr tablet 150 mg (not administered)  ARIPiprazole (ABILIFY) tablet 5 mg (not administered)  lactulose (CHRONULAC) 10 GM/15ML solution 30 g (not administered)  acetaminophen (TYLENOL) tablet 650 mg (not administered)    Or  acetaminophen (TYLENOL) suppository 650 mg (not administered)  ondansetron (ZOFRAN) tablet 4 mg (not administered)    Or  ondansetron (ZOFRAN) injection 4 mg (not administered)  cefTRIAXone (ROCEPHIN) 1 g in dextrose 5 % 50 mL IVPB (not administered)  sodium chloride 0.9 % bolus 1,000 mL (0 mLs Intravenous Stopped 01/18/17 2213)  cefTRIAXone (ROCEPHIN) 1 g in dextrose 5 % 50 mL IVPB (1 g Intravenous New Bag/Given 01/18/17 2313)  lactulose (CHRONULAC) 10 GM/15ML solution 30 g (30 g Oral Given 01/18/17 2224)     Initial Impression / Assessment and Plan / ED Course  I have reviewed the triage vital signs and the nursing notes.  Pertinent labs & imaging results that were available during my care of the patient were reviewed by me and considered in my medical decision making (see chart for details).     Hepatic encephalopathy with elevated ammonia level. Given a dose of lactulose. Patient also noted to have urinary tract infection without evidence of pyelonephritis. Blood cultures drawn and patient started on Rocephin. No evidence of severe sepsis at this time. Abdomen benign and no sign of SBP.  Patient admitted for further management  Final Clinical Impressions(s) / ED Diagnoses   Final diagnoses:  Encephalopathy, hepatic (HCC)  Acute cystitis with hematuria      Embry Huss, Amadeo Garnet, MD 01/19/17 [phone]

## 2017-01-18 NOTE — ED Notes (Signed)
ED TO INPATIENT HANDOFF REPORT  Name/Age/Gender Katrina Cohen 51 y.o. female  Code Status    Code Status Orders        Start     Ordered   01/18/17 2155  Full code  Continuous     01/18/17 2156    Code Status History    Date Active Date Inactive Code Status Order ID Comments User Context   04/02/2016  5:06 AM 04/03/2016  3:24 PM Full Code [id number]  Vianne Bulls, MD ED   11/12/2015  5:53 PM 11/13/2015  3:02 PM Full Code [id number]  Samuella Cota, MD ED   11/07/2015  6:37 AM 11/08/2015  2:23 PM Full Code [id number]  Lily Kocher, MD Inpatient   04/28/2015  9:11 PM 04/30/2015 11:23 PM Full Code [id number]  Gennaro Africa, MD ED   06/07/2011  1:21 PM 06/08/2011  9:57 PM Full Code [id number]  Delora Fuel, MD ED   05/27/2011  9:12 AM 05/27/2011  6:11 PM Full Code [id number]  Julieta Bellini, NP ED      Home/SNF/Other Home  Chief Complaint Urinary Retention; Fatigue; Dizziness  Level of Care/Admitting Diagnosis ED Disposition    ED Disposition Condition Comment   Admit  Hospital Area: [place] [[id number]]  Level of Care: Telemetry [5]  Admit to tele based on following criteria: Other see comments  Comments: Encephalopathy.  Diagnosis: Acute encephalopathy [443154]  Admitting Physician: Rise Patience [phone]  Attending Physician: Rise Patience [phone]  Estimated length of stay: past midnight tomorrow  Certification:: I certify this patient will need inpatient services for at least 2 midnights  PT Class (Do Not Modify): Inpatient [101]  PT Acc Code (Do Not Modify): Private [1]       Medical History Past Medical History:  Diagnosis Date  . Bipolar disorder ([place]) 1980s   with depression, anxiety  . Cirrhosis ([place])   . Depression with anxiety 1980s   [facility] admission 05/2011.    . Diabetes mellitus   . Dysphagia 11/2014   normal esophagram 11/2014. EGD per Dr Paulita Fujita.   . Hypertension   . Jaundice 04/2015   In setting of infectious mononucleosis.  Ultrasound in [place]: hepatosplenomegaly, portal htn, possible cirrhosis  . Liver disease   . Mononucleosis 04/15/2015    Allergies Allergies  Allergen Reactions  . Tizanidine Other (See Comments)    Dizzy,falls    IV Location/Drains/Wounds Patient Lines/Drains/Airways Status   Active Line/Drains/Airways    Name:   Placement date:   Placement time:   Site:   Days:   Peripheral IV 01/18/17 Right Antecubital  01/18/17    1954    Antecubital    less than 1          Labs/Imaging Results for orders placed or performed during the hospital encounter of 01/18/17 (from the past 48 hour(s))  Ammonia     Status: Abnormal   Collection Time: 01/18/17  7:23 PM  Result Value Ref Range   Ammonia 88 (H) 9 - 35 umol/L  Lipase, blood     Status: Abnormal   Collection Time: 01/18/17  7:23 PM  Result Value Ref Range   Lipase 61 (H) 11 - 51 U/L  Comprehensive metabolic panel     Status: Abnormal   Collection Time: 01/18/17  7:23 PM  Result Value Ref Range   Sodium 134 (L) 135 - 145 mmol/L   Potassium 4.2 3.5 - 5.1 mmol/L   Chloride 105 101 - 111 mmol/L  CO2 22 22 - 32 mmol/L   Glucose, Bld 206 (H) 65 - 99 mg/dL   BUN 9 6 - 20 mg/dL   Creatinine, Ser 0.54 0.44 - 1.00 mg/dL   Calcium 8.5 (L) 8.9 - 10.3 mg/dL   Total Protein 6.3 (L) 6.5 - 8.1 g/dL   Albumin 3.1 (L) 3.5 - 5.0 g/dL   AST 64 (H) 15 - 41 U/L   ALT 30 14 - 54 U/L   Alkaline Phosphatase 145 (H) 38 - 126 U/L   Total Bilirubin 6.1 (H) 0.3 - 1.2 mg/dL   GFR calc non Af Amer >60 >60 mL/min   GFR calc Af Amer >60 >60 mL/min    Comment: (NOTE) The eGFR has been calculated using the CKD EPI equation. This calculation has not been validated in all clinical situations. eGFR's persistently <60 mL/min signify possible Chronic Kidney Disease.    Anion gap 7 5 - 15  CBC     Status: Abnormal   Collection Time: 01/18/17  7:23 PM  Result Value Ref Range   WBC 5.0 4.0 - 10.5 K/uL   RBC 3.57 (L) 3.87 - 5.11 MIL/uL   Hemoglobin  10.9 (L) 12.0 - 15.0 g/dL   HCT 31.9 (L) 36.0 - 46.0 %   MCV 89.4 78.0 - 100.0 fL   MCH 30.5 26.0 - 34.0 pg   MCHC 34.2 30.0 - 36.0 g/dL   RDW 17.4 (H) 11.5 - 15.5 %   Platelets 113 (L) 150 - 400 K/uL    Comment: CONSISTENT WITH PREVIOUS RESULT  Urinalysis, Routine w reflex microscopic     Status: Abnormal   Collection Time: 01/18/17  7:58 PM  Result Value Ref Range   Color, Urine AMBER (A) YELLOW    Comment: BIOCHEMICALS MAY BE AFFECTED BY COLOR   APPearance TURBID (A) CLEAR   Specific Gravity, Urine 1.011 1.005 - 1.030   pH 5.0 5.0 - 8.0   Glucose, UA NEGATIVE NEGATIVE mg/dL   Hgb urine dipstick SMALL (A) NEGATIVE   Bilirubin Urine NEGATIVE NEGATIVE   Ketones, ur NEGATIVE NEGATIVE mg/dL   Protein, ur 100 (A) NEGATIVE mg/dL   Nitrite POSITIVE (A) NEGATIVE   Leukocytes, UA LARGE (A) NEGATIVE   RBC / HPF TOO NUMEROUS TO COUNT 0 - 5 RBC/hpf   WBC, UA TOO NUMEROUS TO COUNT 0 - 5 WBC/hpf   Bacteria, UA MANY (A) NONE SEEN   Squamous Epithelial / LPF 6-30 (A) NONE SEEN   WBC Clumps PRESENT    No results found.  Pending Labs [facility]     Ordered   01/19/17 0500  HIV antibody (Routine Testing)  Tomorrow morning,   R     01/18/17 2156   01/19/17 3016  Basic metabolic panel  Tomorrow morning,   R     01/18/17 2156   01/19/17 0500  CBC  Tomorrow morning,   R     01/18/17 2156   01/19/17 0500  Hepatic function panel  Tomorrow morning,   R     01/18/17 2156   01/19/17 0500  Ammonia  Tomorrow morning,   R     01/18/17 2156   01/18/17 2032  Culture, blood (Routine X 2) w Reflex to ID Panel  BLOOD CULTURE X 2,   R     01/18/17 2031      Vitals/Pain Today's Vitals   01/18/17 2015 01/18/17 2030 01/18/17 2045 01/18/17 2108  BP: 127/64 122/71 130/73 123/66  Pulse:  69  Resp: 11 12 11 14   Temp:    97.7 F (36.5 C)  TempSrc:    Oral  SpO2:      Weight:      Height:      PainSc:        Isolation Precautions No active isolations  Medications Medications   cefTRIAXone (ROCEPHIN) 1 g in dextrose 5 % 50 mL IVPB (not administered)  lactulose (CHRONULAC) 10 GM/15ML solution 30 g (not administered)  MULTIVITAMIN ADULTS TABS 2 tablet (not administered)  methocarbamol (ROBAXIN) tablet 500 mg (not administered)  albuterol (PROVENTIL) (2.5 MG/3ML) 0.083% nebulizer solution 2.5 mg (not administered)  spironolactone (ALDACTONE) tablet 100 mg (not administered)  albuterol (PROVENTIL HFA;VENTOLIN HFA) 108 (90 Base) MCG/ACT inhaler 2 puff (not administered)  citalopram (CELEXA) tablet 20 mg (not administered)  buPROPion (WELLBUTRIN SR) 12 hr tablet 150 mg (not administered)  ARIPiprazole (ABILIFY) tablet 5 mg (not administered)  lactulose (CHRONULAC) 10 GM/15ML solution 30 g (not administered)  acetaminophen (TYLENOL) tablet 650 mg (not administered)    Or  acetaminophen (TYLENOL) suppository 650 mg (not administered)  ondansetron (ZOFRAN) tablet 4 mg (not administered)    Or  ondansetron (ZOFRAN) injection 4 mg (not administered)  cefTRIAXone (ROCEPHIN) 1 g in dextrose 5 % 50 mL IVPB (not administered)  sodium chloride 0.9 % bolus 1,000 mL (1,000 mLs Intravenous New Bag/Given 01/18/17 2012)    Mobility walks with person assist

## 2017-01-18 NOTE — ED Triage Notes (Signed)
Pt from home with c/o right flank pain, pain with urination (pt states this is a chronic problem), and lethargy. Pt has been lethargic since Friday when she was seen by PCP. Pt was told on Friday that her ammonia level was elevated, but she is unsure what the level was. Pt reports her lethargy has increased. Pt is falling asleep mid sentence in triage/

## 2017-01-19 ENCOUNTER — Inpatient Hospital Stay (HOSPITAL_COMMUNITY): Payer: Medicaid Other

## 2017-01-19 ENCOUNTER — Encounter (HOSPITAL_COMMUNITY): Payer: Self-pay

## 2017-01-19 DIAGNOSIS — K7682 Hepatic encephalopathy: Secondary | ICD-10-CM

## 2017-01-19 DIAGNOSIS — K7290 Hepatic failure, unspecified without coma: Principal | ICD-10-CM

## 2017-01-19 LAB — HEPATIC FUNCTION PANEL
ALT: 28 U/L (ref 14–54)
AST: 41 U/L (ref 15–41)
Albumin: 2.7 g/dL — ABNORMAL LOW (ref 3.5–5.0)
Alkaline Phosphatase: 135 U/L — ABNORMAL HIGH (ref 38–126)
BILIRUBIN DIRECT: 2.3 mg/dL — AB (ref 0.1–0.5)
BILIRUBIN TOTAL: 5 mg/dL — AB (ref 0.3–1.2)
Indirect Bilirubin: 2.7 mg/dL — ABNORMAL HIGH (ref 0.3–0.9)
Total Protein: 6 g/dL — ABNORMAL LOW (ref 6.5–8.1)

## 2017-01-19 LAB — RAPID URINE DRUG SCREEN, HOSP PERFORMED
Amphetamines: NOT DETECTED
Barbiturates: NOT DETECTED
Benzodiazepines: POSITIVE — AB
COCAINE: NOT DETECTED
OPIATES: POSITIVE — AB
TETRAHYDROCANNABINOL: NOT DETECTED

## 2017-01-19 LAB — BASIC METABOLIC PANEL
Anion gap: 6 (ref 5–15)
BUN: 8 mg/dL (ref 6–20)
CHLORIDE: 107 mmol/L (ref 101–111)
CO2: 25 mmol/L (ref 22–32)
Calcium: 8.7 mg/dL — ABNORMAL LOW (ref 8.9–10.3)
Creatinine, Ser: 0.78 mg/dL (ref 0.44–1.00)
GFR calc non Af Amer: >60 mL/min (ref >60)
Glucose, Bld: 152 mg/dL — ABNORMAL HIGH (ref 65–99)
POTASSIUM: 3.5 mmol/L (ref 3.5–5.1)
SODIUM: 138 mmol/L (ref 135–145)

## 2017-01-19 LAB — CBC
HEMATOCRIT: 31.9 % — AB (ref 36.0–46.0)
Hemoglobin: 10.7 g/dL — ABNORMAL LOW (ref 12.0–15.0)
MCH: 30.2 pg (ref 26.0–34.0)
MCHC: 33.5 g/dL (ref 30.0–36.0)
MCV: 90.1 fL (ref 78.0–100.0)
PLATELETS: 64 10*3/uL — AB (ref 150–400)
RBC: 3.54 MIL/uL — AB (ref 3.87–5.11)
RDW: 17.4 % — ABNORMAL HIGH (ref 11.5–15.5)
WBC: 3.6 10*3/uL — AB (ref 4.0–10.5)

## 2017-01-19 LAB — BODY FLUID CELL COUNT WITH DIFFERENTIAL
EOS FL: 0 %
Lymphs, Fluid: 24 %
MONOCYTE-MACROPHAGE-SEROUS FLUID: 72 % (ref 50–90)
Neutrophil Count, Fluid: 4 % (ref 0–25)
WBC FLUID: 234 uL (ref 0–1000)

## 2017-01-19 LAB — HIV ANTIBODY (ROUTINE TESTING W REFLEX): HIV Screen 4th Generation wRfx: NONREACTIVE

## 2017-01-19 LAB — AMMONIA: Ammonia: 51 umol/L — ABNORMAL HIGH (ref 9–35)

## 2017-01-19 LAB — ALBUMIN, PLEURAL OR PERITONEAL FLUID

## 2017-01-19 LAB — GLUCOSE, CAPILLARY
GLUCOSE-CAPILLARY: 132 mg/dL — AB (ref 65–99)
GLUCOSE-CAPILLARY: 126 mg/dL — AB (ref 65–99)
GLUCOSE-CAPILLARY: 135 mg/dL — AB (ref 65–99)
GLUCOSE-CAPILLARY: 136 mg/dL — AB (ref 65–99)

## 2017-01-19 LAB — PROTEIN, PLEURAL OR PERITONEAL FLUID: Total protein, fluid: <3 g/dL

## 2017-01-19 MED ORDER — FUROSEMIDE 20 MG PO TABS
20.0000 mg | ORAL_TABLET | Freq: Every day | ORAL | Status: DC
  Administered 2017-01-19 – 2017-01-21 (×3): 20 mg via ORAL
  Filled 2017-01-19 (×3): qty 1

## 2017-01-19 MED ORDER — LORAZEPAM 1 MG PO TABS
1.0000 mg | ORAL_TABLET | Freq: Two times a day (BID) | ORAL | Status: DC | PRN
  Administered 2017-01-19 – 2017-01-20 (×2): 1 mg via ORAL
  Filled 2017-01-19 (×2): qty 1

## 2017-01-19 MED ORDER — OXYCODONE HCL 5 MG PO TABS
15.0000 mg | ORAL_TABLET | Freq: Three times a day (TID) | ORAL | Status: DC | PRN
  Administered 2017-01-20: 15 mg via ORAL
  Filled 2017-01-19: qty 3

## 2017-01-19 MED ORDER — INSULIN ASPART 100 UNIT/ML ~~LOC~~ SOLN
0.0000 [IU] | Freq: Three times a day (TID) | SUBCUTANEOUS | Status: DC
  Administered 2017-01-19 (×3): 1 [IU] via SUBCUTANEOUS
  Administered 2017-01-20 (×2): 2 [IU] via SUBCUTANEOUS

## 2017-01-19 MED ORDER — MORPHINE SULFATE ER 30 MG PO TBCR
30.0000 mg | EXTENDED_RELEASE_TABLET | Freq: Two times a day (BID) | ORAL | Status: DC
  Administered 2017-01-19 – 2017-01-21 (×5): 30 mg via ORAL
  Filled 2017-01-19 (×5): qty 1

## 2017-01-19 MED ORDER — LIDOCAINE HCL 1 % IJ SOLN
INTRAMUSCULAR | Status: AC
  Filled 2017-01-19: qty 10

## 2017-01-19 NOTE — Procedures (Signed)
Ultrasound-guided diagnostic and therapeutic paracentesis performed yielding 1.8 liters of hazy, yellow fluid. No immediate complications. A portion of the fluid was submitted to the lab for preordered studies.

## 2017-01-19 NOTE — Care Management Note (Signed)
Case Management Note  Patient Details  Name: Katrina Cohen MRN: [id number] Date of Birth: [birth year]  Subjective/Objective:  TC [facility] home hospice service-rep Roxanne confirmed active w/home hospice-rep will call patient in rm to f/u on patient's concerns with their services.                  Action/Plan:d/c plan home w/hospice.   Expected Discharge Date:   (unknown)               Expected Discharge Plan:  Home w Hospice Care  In-House Referral:     Discharge planning Services  CM Consult  Post Acute Care Choice:    Choice offered to:     DME Arranged:    DME Agency:     HH Arranged:    HH Agency:     Status of Service:  In process, will continue to follow  If discussed at Long Length of Stay Meetings, dates discussed:    Additional Comments:  Lanier Clam, RN 01/19/2017, 11:24 AM

## 2017-01-19 NOTE — Progress Notes (Signed)
Spoke to patient in rm about concerns-patient says she wants to find out which service would be better for her-hospice which is active with([facility]) or HHC. CM explained that [facility]HC-short term w/goal of within 6 weeks meeting goals for acute medical needs. Recc palliative cons-MD notified.

## 2017-01-19 NOTE — Progress Notes (Signed)
[facility] PLAN OF CARE NOTE Patient: Katrina EeJennifer Laakso ZOX:[id number]   PCP: Deneen Hartsodd, Elizabeth, FNP DOB: 1965/08/23   DOA: 01/18/2017   DOS: 01/19/2017    Patient was admitted by my colleague Dr. Toniann FailKakrakandy  earlier on 01/19/2017. I have reviewed the H&P as well as assessment and plan and agree with the same. Important changes in the plan are listed below.  Plan of care: Active Problems:   Cirrhosis (HCC)   Thrombocytopenia (HCC)   Acute encephalopathy   Encephalopathy, hepatic (HCC) Follow-up on ultrasound paracentesis. Continue IV ceftriaxone for now. Continue lactulose. Recheck ammonia tomorrow. Patient on hospice? Continue outpatient follow-up. Add Lasix to Aldactone regimen.  Author: Lynden OxfordPranav Ashani Pumphrey, MD [facility] Pager: [phone] 01/19/2017 2:56 PM   If 7PM-7AM, please contact night-coverage at [email or web address], password [email or web address]

## 2017-01-19 NOTE — H&P (Addendum)
History and Physical    Katrina Cohen ZOX:[id number] DOB: 06/27/1965 DOA: 01/18/2017  PCP: Deneen Harts, FNP  Patient coming from: Home.  Chief Complaint: Confusion.  HPI: Katrina Cohen is a 51 y.o. 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 Patient stated that 4 days ago patient had gone to her PCP with similar complaints and was told to increase her lactulose dose. Despite taking which patient was treated getting lethargic. Patient denies any fall trauma abdominal pain nausea vomiting or diarrhea. 10 days ago patient was treated for UTI after CAT scan showed features concerning for UTI.   ED Course: In the ER patient is mildly lethargic and ammonia levels were found to be around 88 and patient was given lactulose. Patient's UA is concerning for UTI and patient was placed on ceftriaxone.  Review of Systems: As per HPI, rest all negative.   Past Medical History:  Diagnosis Date  . Bipolar disorder (HCC) 1980s   with depression, anxiety  . Cirrhosis (HCC)   . Depression with anxiety 1980s   [facility] admission 05/2011.    . Diabetes mellitus   . Dysphagia 11/2014   normal esophagram 11/2014. EGD per Dr Dulce Sellar.   . Hypertension   . Jaundice 04/2015   In setting of infectious mononucleosis. Ultrasound in [place]: hepatosplenomegaly, portal htn, possible cirrhosis  . Liver disease   . Mononucleosis 04/15/2015    Past Surgical History:  Procedure Laterality Date  . APPENDECTOMY    . CESAREAN SECTION    . COLONOSCOPY    . ESOPHAGOGASTRODUODENOSCOPY     Dr Dulce Sellar ? 2016.   Gerald Leitz GENERIC HISTORICAL  04/20/2016   IR PARACENTESIS 04/20/2016 Darrell K Allred, PA-C [facility]     reports that she has been smoking Cigarettes.  She has been smoking about 0.50 packs per day. She has never used smokeless tobacco. She reports that  she does not drink alcohol or use drugs.  Allergies  Allergen Reactions  . Tizanidine Other (See Comments)    Dizzy,falls    Family History  Problem Relation Age of Onset  . Diabetes Mother   . Cancer Father   . Diabetes Sister   . Diabetes Brother     Prior to Admission medications   Medication Sig Start Date End Date Taking? Authorizing Provider  ARIPiprazole (ABILIFY) 5 MG tablet Take 5 mg by mouth at bedtime.  04/02/15  Yes [provider]  buPROPion (WELLBUTRIN SR) 150 MG 12 hr tablet Take 150 mg by mouth 2 (two) times daily.  04/20/16 04/20/17 Yes [provider]  citalopram (CELEXA) 20 MG tablet Take 20 mg by mouth at bedtime.    Yes [provider]  glyBURIDE (DIABETA) 2.5 MG tablet Take 2.5 mg by mouth daily as needed (high blood sugar).  03/04/16 03/04/17 Yes [provider]  ibuprofen (ADVIL,MOTRIN) 200 MG tablet Take 200 mg by mouth every 6 (six) hours as needed for headache or mild pain.   Yes [provider]  lactulose (CHRONULAC) 10 GM/15ML solution TAKE 25 ML BY MOUTH EVERY 1-2 HOURS TO PRODUCE 2 SOFT BOWEL MOVEMENTS. INCREASE DAILY TO PRODUCE 2-3 01/14/17  Yes [provider]  LORazepam (ATIVAN) 1 MG tablet Take 1 mg by mouth every 8 (eight) hours as needed for anxiety.    Yes [provider]  MILK THISTLE PO Take 1  tablet by mouth 3 (three) times daily.   Yes [provider]  morphine (MS CONTIN) 30 MG 12 hr tablet Take 30 mg by mouth 2 (two) times daily. 11/05/16  Yes [provider]  Multiple Vitamins-Minerals (MULTIVITAMIN ADULTS) TABS Take 2 tablets by mouth daily.   Yes [provider]  OVER THE COUNTER MEDICATION Take 2 capsules by mouth 2 (two) times daily. [place]   Yes [provider]  oxyCODONE (ROXICODONE) 15 MG immediate release tablet Take 15 mg by mouth every 8 (eight) hours as needed for pain.   Yes [provider]  spironolactone (ALDACTONE) 50 MG  tablet Take 100 mg by mouth 2 (two) times daily.    Yes [provider]  Vitamin D, Ergocalciferol, (DRISDOL) 50000 units CAPS capsule Take 50,000 Units by mouth 2 (two) times a week. 12/23/16 12/23/17 Yes [provider]  albuterol (PROVENTIL HFA;VENTOLIN HFA) 108 (90 Base) MCG/ACT inhaler Inhale 2 puffs into the lungs every 4 (four) hours as needed for wheezing or shortness of breath.  09/02/15   [provider]  albuterol (PROVENTIL) (2.5 MG/3ML) 0.083% nebulizer solution Take 3 mLs (2.5 mg total) by nebulization every 4 (four) hours as needed for wheezing or shortness of breath. 11/13/15   Standley Brooking, MD  cephALEXin (KEFLEX) 500 MG capsule Take 1 capsule (500 mg total) by mouth 4 (four) times daily. Patient not taking: Reported on 01/18/2017 01/07/17   Dorena Bodo, NP  Liniments (BLUE-EMU SUPER STRENGTH) CREA Apply 1 application topically as needed (For cramping in hands or neck.).    [provider]  methocarbamol (ROBAXIN) 500 MG tablet Take 1 tablet (500 mg total) by mouth 2 (two) times daily. Patient taking differently: Take 500 mg by mouth 2 (two) times daily as needed for muscle spasms.  11/09/16   Audry Pili, PA-C  metroNIDAZOLE (METROGEL) 0.75 % vaginal gel Place 1 Applicatorful vaginally 2 (two) times daily. 01/07/17   Dorena Bodo, NP  ondansetron (ZOFRAN ODT) 4 MG disintegrating tablet Take 1 tablet (4 mg total) by mouth every 8 (eight) hours as needed for nausea or vomiting. 01/07/17   Dorena Bodo, NP  phenazopyridine (PYRIDIUM) 200 MG tablet Take 1 tablet (200 mg total) by mouth 3 (three) times daily as needed for pain. 01/07/17   Dorena Bodo, NP  promethazine (PHENERGAN) 25 MG tablet Take 25 mg by mouth every 6 (six) hours as needed for nausea or vomiting.    [provider]    Physical Exam: Vitals:   01/18/17 2145 01/18/17 2200 01/18/17 2215 01/18/17 2251  BP: 120/60 124/81 125/74 121/77  Pulse: 71 65 72 62    Resp: 15 11 11 16   Temp:    97.9 F (36.6 C)  TempSrc:    Oral  SpO2: 100% 98% 98% 100%  Weight:      Height:          Constitutional: Moderately built and nourished. Vitals:   01/18/17 2145 01/18/17 2200 01/18/17 2215 01/18/17 2251  BP: 120/60 124/81 125/74 121/77  Pulse: 71 65 72 62  Resp: 15 11 11 16   Temp:    97.9 F (36.6 C)  TempSrc:    Oral  SpO2: 100% 98% 98% 100%  Weight:      Height:       Eyes: Anicteric no pallor. ENMT: No discharge from the ears eyes nose or mouth. Neck: No mass felt. No neck rigidity. Respiratory: No rhonchi or crepitations. Cardiovascular: S1-S2 and no murmurs appreciated.  Abdomen: Mildly distended nontender bowel sounds present. Musculoskeletal: No edema. Skin: No rash. Neurologic: Alert awake oriented to time place and person. Moves all extremities. Psychiatric: Appears normal.   Labs on Admission: I have personally reviewed following labs and imaging studies  CBC:  Recent Labs Lab 01/18/17 1923 01/19/17 0417  WBC 5.0 3.6*  HGB 10.9* 10.7*  HCT 31.9* 31.9*  MCV 89.4 90.1  PLT 113* 64*   Basic Metabolic Panel:  Recent Labs Lab 01/18/17 1923 01/19/17 0417  NA 134* 138  K 4.2 3.5  CL 105 107  CO2 22 25  GLUCOSE 206* 152*  BUN 9 8  CREATININE 0.54 0.78  CALCIUM 8.5* 8.7*   GFR: Estimated Creatinine Clearance: 79.6 mL/min (by C-G formula based on SCr of 0.78 mg/dL). Liver Function Tests:  Recent Labs Lab 01/18/17 1923 01/19/17 0417  AST 64* 41  ALT 30 28  ALKPHOS 145* 135*  BILITOT 6.1* 5.0*  PROT 6.3* 6.0*  ALBUMIN 3.1* 2.7*    Recent Labs Lab 01/18/17 1923  LIPASE 61*    Recent Labs Lab 01/18/17 1923 01/19/17 0417  AMMONIA 88* 51*   Coagulation Profile: No results for input(s): INR, PROTIME in the last 168 hours. Cardiac Enzymes: No results for input(s): CKTOTAL, CKMB, CKMBINDEX, TROPONINI in the last 168 hours. BNP (last 3 results) No results for input(s): PROBNP in the last 8760  hours. HbA1C: No results for input(s): HGBA1C in the last 72 hours. CBG: No results for input(s): GLUCAP in the last 168 hours. Lipid Profile: No results for input(s): CHOL, HDL, LDLCALC, TRIG, CHOLHDL, LDLDIRECT in the last 72 hours. Thyroid Function Tests: No results for input(s): TSH, T4TOTAL, FREET4, T3FREE, THYROIDAB in the last 72 hours. Anemia Panel: No results for input(s): VITAMINB12, FOLATE, FERRITIN, TIBC, IRON, RETICCTPCT in the last 72 hours. Urine analysis:    Component Value Date/Time   COLORURINE AMBER (A) 01/18/2017 1958   APPEARANCEUR TURBID (A) 01/18/2017 1958   LABSPEC 1.011 01/18/2017 1958   PHURINE 5.0 01/18/2017 1958   GLUCOSEU NEGATIVE 01/18/2017 1958   HGBUR SMALL (A) 01/18/2017 1958   BILIRUBINUR NEGATIVE 01/18/2017 1958   KETONESUR NEGATIVE 01/18/2017 1958   PROTEINUR 100 (A) 01/18/2017 1958   UROBILINOGEN 4.0 (H) 01/02/2017 1735   NITRITE POSITIVE (A) 01/18/2017 1958   LEUKOCYTESUR LARGE (A) 01/18/2017 1958   Sepsis Labs: @LABRCNTIP (procalcitonin:4,lacticidven:4) )No results found for this or any previous visit (from the past 240 hour(s)).   Radiological Exams on Admission: No results found.   Assessment/Plan Active Problems:   Cirrhosis (HCC)   Thrombocytopenia (HCC)   Acute encephalopathy   Encephalopathy, hepatic (HCC)    1. Hepatic encephalopathy - patient's mentation seems to be waxing and waning likely from hepatic encephalopathy. Patient is placed on lactulose 30 g 3 times a day. If patient's lethargy or pneumonia does not improve then may consider Xifaxan. I have ordered ultrasound-guided paracentesis to rule out SBP and patient is on ceftriaxone. Check urine drug screen. Patient is on long-acting pain medications and also when necessary oxycodone. If patient remains encephalopathic then may have to hold these pain medications in the morning for which the next dose is due. 2. UTI - follow urine cultures. Patient on  ceftriaxone. 3. Thrombocytopenia likely from cirrhosis. Follow CBC. 4. Diabetes mellitus type 2 - will place patient on sliding scale coverage. 5. Cirrhosis of the liver on spironolactone. Patient states she has not had any alcohol for long time.  I have reviewed patient's charts.   DVT prophylaxis:  SCDs. Code Status: Full code.  Family Communication: Discussed with patient.  Disposition Plan: Home.  Consults called: None.  Admission status: Inpatient.    Eduard Clos MD [facility] Pager [phone].  If 7PM-7AM, please contact night-coverage [email or web address] Password [email or web address]  01/19/2017, 7:07 AM

## 2017-01-19 NOTE — Care Management Note (Signed)
Case Management Note  Patient Details  Name: Katrina EeJennifer Cohen MRN: [id number] Date of Birth: [birth year]  Subjective/Objective:51 y/o f admitted w/Acute encephalopathy. From home.                    Action/Plan:d/c plan home.   Expected Discharge Date:   (unknown)               Expected Discharge Plan:  Home/Self Care  In-House Referral:     Discharge planning Services  CM Consult  Post Acute Care Choice:    Choice offered to:     DME Arranged:    DME Agency:     HH Arranged:    HH Agency:     Status of Service:  In process, will continue to follow  If discussed at Long Length of Stay Meetings, dates discussed:    Additional Comments:  Lanier ClamMahabir, Felecia Stanfill, RN 01/19/2017, 10:40 AM

## 2017-01-20 ENCOUNTER — Encounter (HOSPITAL_COMMUNITY): Payer: Self-pay

## 2017-01-20 DIAGNOSIS — G9340 Encephalopathy, unspecified: Secondary | ICD-10-CM

## 2017-01-20 LAB — PROTIME-INR
INR: 1.7
Prothrombin Time: 19.8 seconds — ABNORMAL HIGH (ref 11.4–15.2)

## 2017-01-20 LAB — GLUCOSE, CAPILLARY
GLUCOSE-CAPILLARY: 111 mg/dL — AB (ref 65–99)
GLUCOSE-CAPILLARY: 175 mg/dL — AB (ref 65–99)
Glucose-Capillary: 166 mg/dL — ABNORMAL HIGH (ref 65–99)
Glucose-Capillary: 156 mg/dL — ABNORMAL HIGH (ref 65–99)

## 2017-01-20 LAB — CBC WITH DIFFERENTIAL/PLATELET
BASOS ABS: 0 10*3/uL (ref 0.0–0.1)
Basophils Relative: 0 %
EOS PCT: 2 %
Eosinophils Absolute: 0.1 10*3/uL (ref 0.0–0.7)
HCT: 30.9 % — ABNORMAL LOW (ref 36.0–46.0)
HEMOGLOBIN: 10.6 g/dL — AB (ref 12.0–15.0)
LYMPHS PCT: 24 %
Lymphs Abs: 0.8 10*3/uL (ref 0.7–4.0)
MCH: 30.5 pg (ref 26.0–34.0)
MCHC: 34.3 g/dL (ref 30.0–36.0)
MCV: 88.8 fL (ref 78.0–100.0)
Monocytes Absolute: 0.5 10*3/uL (ref 0.1–1.0)
Monocytes Relative: 16 %
NEUTROS ABS: 1.8 10*3/uL (ref 1.7–7.7)
NEUTROS PCT: 58 %
PLATELETS: 57 10*3/uL — AB (ref 150–400)
RBC: 3.48 MIL/uL — AB (ref 3.87–5.11)
RDW: 17.2 % — ABNORMAL HIGH (ref 11.5–15.5)
WBC: 3.1 10*3/uL — AB (ref 4.0–10.5)

## 2017-01-20 LAB — COMPREHENSIVE METABOLIC PANEL
ALT: 25 U/L (ref 14–54)
ANION GAP: 6 (ref 5–15)
AST: 35 U/L (ref 15–41)
Albumin: 2.6 g/dL — ABNORMAL LOW (ref 3.5–5.0)
Alkaline Phosphatase: 113 U/L (ref 38–126)
BILIRUBIN TOTAL: 5.6 mg/dL — AB (ref 0.3–1.2)
BUN: 8 mg/dL (ref 6–20)
CHLORIDE: 103 mmol/L (ref 101–111)
CO2: 26 mmol/L (ref 22–32)
Calcium: 8.7 mg/dL — ABNORMAL LOW (ref 8.9–10.3)
Creatinine, Ser: 0.73 mg/dL (ref 0.44–1.00)
Glucose, Bld: 137 mg/dL — ABNORMAL HIGH (ref 65–99)
POTASSIUM: 3.8 mmol/L (ref 3.5–5.1)
Sodium: 135 mmol/L (ref 135–145)
TOTAL PROTEIN: 5.7 g/dL — AB (ref 6.5–8.1)

## 2017-01-20 LAB — AMMONIA: AMMONIA: 43 umol/L — AB (ref 9–35)

## 2017-01-20 LAB — MAGNESIUM: Magnesium: 1.4 mg/dL — ABNORMAL LOW (ref 1.7–2.4)

## 2017-01-20 MED ORDER — MAGNESIUM SULFATE 2 GM/50ML IV SOLN
2.0000 g | Freq: Once | INTRAVENOUS | Status: AC
  Administered 2017-01-20: 2 g via INTRAVENOUS
  Filled 2017-01-20: qty 50

## 2017-01-20 NOTE — Progress Notes (Signed)
[facility] Progress Note  Patient: Katrina Cohen OZH:[id number]   PCP: Deneen Cohen, Elizabeth, FNP DOB: [birth year]   DOA: 01/18/2017   DOS: 01/20/2017   Date of Service: the patient was seen and examined on 01/20/2017  Subjective: Feeling better, pain is better. Concerned regarding role of hospice in her care. She mentions that she was on hospice as her PCP felt that was better way of managing her pain.  Brief hospital course: Pt. with PMH of alcohol-induced liver cirrhosis, type II DM, depression; admitted on 01/18/2017, presented with complaint of lethargy, was found to have hepatitic encephalopathy. Currently further plan is follow-up on cultures from paracentesis.  Assessment and Plan: 1. Liver cirrhosis. Hepatic encephalopathy. Ascites. Concern for SBP. S/P paracentesis. 1.8 L. Started back on Aldactone. Added Lasix. Continue low-salt diet. Lactulose initially ordered as an [place] now on scheduled low-dose lactulose. Ammonia level significantly better. No asterixis. Mentation back to baseline. We will follow up on culture since her WBCs were borderline elevated on fluid analysis. Continue IV ceftriaxone for now.  2. Concern for UTI. Follow-up on culture continued on ceftriaxone.  3. Type 2 diabetes mellitus. Continue sliding scale coverage.  4. Thrombus cytopenia. Continue to monitor for now.  5. Goals of care discussion. Patient was to remain on full code. She mentions that she was primarily on hospice only for pain management rather than end-of-life care. Mentioned to patient that discuss with PCP regarding continuation or discontinuation of hospice.  Diet: low salt diet DVT Prophylaxis: subcutaneous Heparin  Advance goals of care discussion: full code  Family Communication: family was present at bedside, at the time of interview. The pt provided permission to discuss medical plan with the family. Opportunity was given to ask question and all questions were answered  satisfactorily.   Disposition:  Discharge to homr.  Consultants: none Procedures: paracentesis   Antibiotics: Anti-infectives    Start     Dose/Rate Route Frequency Ordered Stop   01/19/17 2200  cefTRIAXone (ROCEPHIN) 1 g in dextrose 5 % 50 mL IVPB     1 g 100 mL/hr over 30 Minutes Intravenous Every 24 hours 01/18/17 2158     01/18/17 2045  cefTRIAXone (ROCEPHIN) 1 g in dextrose 5 % 50 mL IVPB     1 g 100 mL/hr over 30 Minutes Intravenous  Once 01/18/17 2031 01/18/17 2343       Objective: Physical Exam: Vitals:   01/19/17 1531 01/19/17 2207 01/20/17 0534 01/20/17 1325  BP: (!) 130/57 (!) 113/57 103/65 111/71  Pulse: 71 71 72 73  Resp: 19 18 16 18   Temp: 98.5 F (36.9 C) 97.7 F (36.5 C) 98 F (36.7 C) 98 F (36.7 C)  TempSrc: Oral Oral Oral Oral  SpO2: 100% 100% 100% 97%  Weight:      Height:        Intake/Output Summary (Last 24 hours) at 01/20/17 1648 Last data filed at 01/20/17 0948  Gross per 24 hour  Intake              530 ml  Output              200 ml  Net              330 ml   Filed Weights   01/18/17 1852  Weight: 69.4 kg (153 lb)   General: Alert, Awake and Oriented to Time, Place and Person. Appear in mild distress, affect appropriate Eyes: PERRL, Conjunctiva normal ENT: Oral Mucosa clear moist. Neck: no JVD,  no Abnormal Mass Or lumps Cardiovascular: S1 and S2 Present, no Murmur, Peripheral Pulses Present Respiratory: normal respiratory effort, Bilateral Air entry equal and Decreased, no use of accessory muscle, Clear to Auscultation, no Crackles, no wheezes Abdomen: Bowel Sound present, Soft and no tenderness, no hernia Skin: no redness, no Rash, no induration Extremities: no Pedal edema, no calf tenderness Neurologic: Grossly no focal neuro deficit. Bilaterally Equal motor strength  Data Reviewed: CBC:  Recent Labs Lab 01/18/17 1923 01/19/17 0417 01/20/17 0417  WBC 5.0 3.6* 3.1*  NEUTROABS  --   --  1.8  HGB 10.9* 10.7* 10.6*  HCT  31.9* 31.9* 30.9*  MCV 89.4 90.1 88.8  PLT 113* 64* 57*   Basic Metabolic Panel:  Recent Labs Lab 01/18/17 1923 01/19/17 0417 01/20/17 0417  NA 134* 138 135  K 4.2 3.5 3.8  CL 105 107 103  CO2 22 25 26   GLUCOSE 206* 152* 137*  BUN 9 8 8   CREATININE 0.54 0.78 0.73  CALCIUM 8.5* 8.7* 8.7*  MG  --   --  1.4*    Liver Function Tests:  Recent Labs Lab 01/18/17 1923 01/19/17 0417 01/20/17 0417  AST 64* 41 35  ALT 30 28 25   ALKPHOS 145* 135* 113  BILITOT 6.1* 5.0* 5.6*  PROT 6.3* 6.0* 5.7*  ALBUMIN 3.1* 2.7* 2.6*    Recent Labs Lab 01/18/17 1923  LIPASE 61*    Recent Labs Lab 01/18/17 1923 01/19/17 0417 01/20/17 0455  AMMONIA 88* 51* 43*   Coagulation Profile:  Recent Labs Lab 01/20/17 0417  INR 1.70   Cardiac Enzymes: No results for input(s): CKTOTAL, CKMB, CKMBINDEX, TROPONINI in the last 168 hours. BNP (last 3 results) No results for input(s): PROBNP in the last 8760 hours. CBG:  Recent Labs Lab 01/19/17 1153 01/19/17 1754 01/19/17 2206 01/20/17 0732 01/20/17 1142  GLUCAP 126* 135* 136* 111* 166*   Studies: No results found.  Scheduled Meds: . ARIPiprazole  5 mg Oral QHS  . buPROPion  150 mg Oral BID  . citalopram  20 mg Oral QHS  . furosemide  20 mg Oral Daily  . insulin aspart  0-9 Units Subcutaneous TID WC  . lactulose  30 g Oral TID  . morphine  30 mg Oral Q12H  . multivitamin with minerals  2 tablet Oral Daily  . spironolactone  100 mg Oral BID   Continuous Infusions: . cefTRIAXone (ROCEPHIN)  IV Stopped (01/19/17 2152)   PRN Meds: acetaminophen **OR** acetaminophen, albuterol, LORazepam, methocarbamol, ondansetron **OR** ondansetron (ZOFRAN) IV, oxyCODONE  Time spent: 35 minutes  Author: Lynden Oxford, MD [facility] Pager: [phone] 01/20/2017 4:48 PM  If 7PM-7AM, please contact night-coverage at [email or web address], password [facility]

## 2017-01-21 LAB — CBC WITH DIFFERENTIAL/PLATELET
Basophils Absolute: 0 10*3/uL (ref 0.0–0.1)
Basophils Relative: 0 %
EOS ABS: 0.1 10*3/uL (ref 0.0–0.7)
EOS PCT: 2 %
HCT: 33.2 % — ABNORMAL LOW (ref 36.0–46.0)
HEMOGLOBIN: 11.1 g/dL — AB (ref 12.0–15.0)
LYMPHS ABS: 0.7 10*3/uL (ref 0.7–4.0)
Lymphocytes Relative: 23 %
MCH: 29.9 pg (ref 26.0–34.0)
MCHC: 33.4 g/dL (ref 30.0–36.0)
MCV: 89.5 fL (ref 78.0–100.0)
MONO ABS: 0.4 10*3/uL (ref 0.1–1.0)
MONOS PCT: 13 %
Neutro Abs: 1.9 10*3/uL (ref 1.7–7.7)
Neutrophils Relative %: 62 %
PLATELETS: 58 10*3/uL — AB (ref 150–400)
RBC: 3.71 MIL/uL — ABNORMAL LOW (ref 3.87–5.11)
RDW: 17.2 % — AB (ref 11.5–15.5)
WBC: 3.1 10*3/uL — ABNORMAL LOW (ref 4.0–10.5)

## 2017-01-21 LAB — COMPREHENSIVE METABOLIC PANEL
ALBUMIN: 2.9 g/dL — AB (ref 3.5–5.0)
ALT: 27 U/L (ref 14–54)
ANION GAP: 7 (ref 5–15)
AST: 42 U/L — AB (ref 15–41)
Alkaline Phosphatase: 142 U/L — ABNORMAL HIGH (ref 38–126)
BUN: 9 mg/dL (ref 6–20)
CHLORIDE: 101 mmol/L (ref 101–111)
CO2: 28 mmol/L (ref 22–32)
CREATININE: 0.73 mg/dL (ref 0.44–1.00)
Calcium: 9.1 mg/dL (ref 8.9–10.3)
GFR calc Af Amer: >60 mL/min (ref >60)
GLUCOSE: 130 mg/dL — AB (ref 65–99)
POTASSIUM: 3.9 mmol/L (ref 3.5–5.1)
Sodium: 136 mmol/L (ref 135–145)
Total Bilirubin: 4.5 mg/dL — ABNORMAL HIGH (ref 0.3–1.2)
Total Protein: 6.4 g/dL — ABNORMAL LOW (ref 6.5–8.1)

## 2017-01-21 LAB — GLUCOSE, CAPILLARY
Glucose-Capillary: 110 mg/dL — ABNORMAL HIGH (ref 65–99)
Glucose-Capillary: 155 mg/dL — ABNORMAL HIGH (ref 65–99)

## 2017-01-21 LAB — MAGNESIUM: MAGNESIUM: 1.8 mg/dL (ref 1.7–2.4)

## 2017-01-21 MED ORDER — CIPROFLOXACIN HCL 500 MG PO TABS: 500.0000 mg | ORAL_TABLET | Freq: Two times a day (BID) | ORAL | 0 refills | Status: AC

## 2017-01-21 MED ORDER — FUROSEMIDE 20 MG PO TABS: 20.0000 mg | ORAL_TABLET | Freq: Every day | ORAL | 0 refills | Status: DC

## 2017-01-21 MED ORDER — LACTULOSE 10 GM/15ML PO SOLN: 30.0000 g | Freq: Three times a day (TID) | ORAL | 0 refills | Status: DC

## 2017-01-21 NOTE — Progress Notes (Signed)
Reviewed pt. Discharge instructions with patient, son and brother. Pt. States will follow up with primary care for refills. Taught back antibiotic / med changes and instructions. D/c for home in care with Son.

## 2017-01-21 NOTE — Care Management Note (Signed)
Case Management Note  Patient Details  Name: Katrina Cohen MRN: [id number] Date of Birth: [birth year]  Subjective/Objective: Faxed w/confirmation to [facility].                   Action/Plan:d/c home hospice.   Expected Discharge Date:  01/21/17               Expected Discharge Plan:  Home w Hospice Care  In-House Referral:     Discharge planning Services  CM Consult  Post Acute Care Choice:    Choice offered to:     DME Arranged:    DME Agency:     HH Arranged:    HH Agency:     Status of Service:  Completed, signed off  If discussed at [employer] Length of [employer] Meetings, dates discussed:    Additional Comments:  Lanier ClamMahabir, Avanni Turnbaugh, RN 01/21/2017, 4:56 PM

## 2017-01-21 NOTE — Discharge Summary (Signed)
[facility] Discharge Summary   Patient: Katrina Cohen ZOX:[id number]   PCP: Deneen Harts, FNP DOB: 22-Feb-1966   Date of admission: 01/18/2017   Date of discharge: 01/21/2017    Discharge Diagnoses:  Active Problems:   Cirrhosis (HCC)   Thrombocytopenia (HCC)   Acute encephalopathy   Encephalopathy, hepatic (HCC)   Admitted From: home Disposition:  Home with hospice  Recommendations for Outpatient Follow-up:  1. Follow-up with PCP, discussed regarding hospice options.   Follow-up Information    Deneen Harts, FNP. Schedule an appointment as soon as possible for a visit in 1 week(s).   Specialty:  Nurse Practitioner Contact information: [address] [place] [address] [phone]          Diet recommendation: Cardiac diet  Activity: The patient is advised to gradually reintroduce usual activities.  Discharge Condition: good  Code Status: full code  History of present illness: As per the H and P dictated on admission, "Katrina Cohen is a 51 y.o. 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 Patient stated that 4 days ago patient had gone to her PCP with similar complaints and was told to increase her lactulose dose. Despite taking which patient was treated getting lethargic. Patient denies any fall trauma abdominal pain nausea vomiting or diarrhea. 10 days ago patient was treated for UTI after CAT scan showed features concerning for UTI.   ED Course: In the ER patient is mildly lethargic and ammonia levels were found to be around 88 and patient was given lactulose. Patient's UA is concerning for UTI and patient was placed on ceftriaxone."  Hospital Course:  Summary of her active problems in the hospital is as following. 1. Liver cirrhosis. Hepatic encephalopathy. Ascites. Concern for  SBP. S/P paracentesis. 1.8 L. Started back on Aldactone. Added Lasix. Continue low-salt diet. Lactulose initially ordered as an [place] now on scheduled low-dose lactulose. Ammonia level significantly better. No asterixis. Mentation back to baseline. WBC elevated on the fluid analysis, culture remained negative. We will finish 5 day treatment course with antibiotics.  2. Concern for UTI. Culture negative. On Cipro and discharge.  3. Type 2 diabetes mellitus. Continue home regimen  4. Thrombocytopenia. Continue to monitor for now.  5. Goals of care discussion. Patient wants to remain on full code. She mentions that her PCP recommended hospice as her pain was not well controlled Mentioned to patient that discuss with PCP regarding continuation or discontinuation of hospice  All other chronic medical condition were stable during the hospitalization.  Patient was ambulatory without any assistance. On the day of the discharge the patient's vitals were stable, and no other acute medical condition were reported by patient. the patient was felt safe to be discharge at home with hospice.  Procedures and Results:  Ultrasound paracentesis   Consultations:  none  DISCHARGE MEDICATION: Discharge Medication List as of 01/21/2017  1:15 PM    START taking these medications   Details  ciprofloxacin (CIPRO) 500 MG tablet Take 1 tablet (500 mg total) by mouth 2 (two) times daily., Starting Fri 01/21/2017, Until Sun 01/23/2017, Normal    furosemide (LASIX) 20 MG tablet Take 1 tablet (20 mg total) by mouth daily., Starting Fri 01/21/2017, Normal      CONTINUE these medications which have CHANGED   Details  lactulose (CHRONULAC) 10 GM/15ML solution Take 45 mLs (30  g total) by mouth 3 (three) times daily., Starting Fri 01/21/2017, Normal      CONTINUE these medications which have NOT CHANGED   Details  ARIPiprazole (ABILIFY) 5 MG tablet Take 5 mg by mouth at bedtime. , Starting Wed 04/02/2015,  Historical Med    buPROPion (WELLBUTRIN SR) 150 MG 12 hr tablet Take 150 mg by mouth 2 (two) times daily. , Starting Tue 04/20/2016, Until Wed 04/20/2017, Historical Med    citalopram (CELEXA) 20 MG tablet Take 20 mg by mouth at bedtime. , Historical Med    glyBURIDE (DIABETA) 2.5 MG tablet Take 2.5 mg by mouth daily as needed (high blood sugar). , Starting Thu 03/04/2016, Until Fri 03/04/2017, Historical Med    LORazepam (ATIVAN) 1 MG tablet Take 1 mg by mouth every 8 (eight) hours as needed for anxiety. , Historical Med    MILK THISTLE PO Take 1 tablet by mouth 3 (three) times daily., Historical Med    morphine (MS CONTIN) 30 MG 12 hr tablet Take 30 mg by mouth 2 (two) times daily., Starting Fri 11/05/2016, Historical Med    Multiple Vitamins-Minerals (MULTIVITAMIN ADULTS) TABS Take 2 tablets by mouth daily., Historical Med    OVER THE COUNTER MEDICATION Take 2 capsules by mouth 2 (two) times daily. [place], Historical Med    oxyCODONE (ROXICODONE) 15 MG immediate release tablet Take 15 mg by mouth every 8 (eight) hours as needed for pain., Historical Med    spironolactone (ALDACTONE) 50 MG tablet Take 100 mg by mouth 2 (two) times daily. , Historical Med    Vitamin D, Ergocalciferol, (DRISDOL) 50000 units CAPS capsule Take 50,000 Units by mouth 2 (two) times a week., Starting Thu 12/23/2016, Until Fri 12/23/2017, Historical Med    albuterol (PROVENTIL HFA;VENTOLIN HFA) 108 (90 Base) MCG/ACT inhaler Inhale 2 puffs into the lungs every 4 (four) hours as needed for wheezing or shortness of breath. , Starting 09/02/2015, Until Discontinued, Historical Med    albuterol (PROVENTIL) (2.5 MG/3ML) 0.083% nebulizer solution Take 3 mLs (2.5 mg total) by nebulization every 4 (four) hours as needed for wheezing or shortness of breath., Starting Thu 11/13/2015, Normal    Liniments (BLUE-EMU SUPER STRENGTH) CREA Apply 1 application topically as needed (For cramping in hands or neck.)., Historical Med      methocarbamol (ROBAXIN) 500 MG tablet Take 1 tablet (500 mg total) by mouth 2 (two) times daily., Starting Tue 11/09/2016, Print    metroNIDAZOLE (METROGEL) 0.75 % vaginal gel Place 1 Applicatorful vaginally 2 (two) times daily., Starting Fri 01/07/2017, Normal    ondansetron (ZOFRAN ODT) 4 MG disintegrating tablet Take 1 tablet (4 mg total) by mouth every 8 (eight) hours as needed for nausea or vomiting., Starting Fri 01/07/2017, Normal    phenazopyridine (PYRIDIUM) 200 MG tablet Take 1 tablet (200 mg total) by mouth 3 (three) times daily as needed for pain., Starting Fri 01/07/2017, Normal    promethazine (PHENERGAN) 25 MG tablet Take 25 mg by mouth every 6 (six) hours as needed for nausea or vomiting., Historical Med      STOP taking these medications     cephALEXin (KEFLEX) 500 MG capsule      ibuprofen (ADVIL,MOTRIN) 200 MG tablet        Allergies  Allergen Reactions  . Tizanidine Other (See Comments)    Dizzy,falls   Discharge Instructions    Diet - low sodium heart healthy    Complete by:  As directed    Diet - low sodium heart  healthy    Complete by:  As directed    Discharge instructions    Complete by:  As directed    It is important that you read following instructions as well as go over your medication list with RN to help you understand your care after this hospitalization.  Discharge Instructions: Please follow-up with PCP in one week  Please request your primary care physician to go over all Hospital Tests and Procedure/Radiological results at the follow up,  Please get all Hospital records sent to your PCP by signing hospital release before you go home.   Do not take more than prescribed Pain, Sleep and Anxiety Medications. You were cared for by a hospitalist during your hospital stay. If you have any questions about your discharge medications or the care you received while you were in the hospital after you are discharged, you can call the unit and ask to speak  with the hospitalist on call if the hospitalist that took care of you is not available.  Once you are discharged, your primary care physician will handle any further medical issues. Please note that NO REFILLS for any discharge medications will be authorized once you are discharged, as it is imperative that you return to your primary care physician (or establish a relationship with a primary care physician if you do not have one) for your aftercare needs so that they can reassess your need for medications and monitor your lab values. You Must read complete instructions/literature along with all the possible adverse reactions/side effects for all the Medicines you take and that have been prescribed to you. Take any new Medicines after you have completely understood and accept all the possible adverse reactions/side effects. Wear Seat belts while driving. If you have smoked or chewed Tobacco in the last 2 yrs please stop smoking and/or stop any Recreational drug use.   Discharge instructions    Complete by:  As directed    It is important that you read following instructions as well as go over your medication list with RN to help you understand your care after this hospitalization.  Discharge Instructions: Please follow-up with PCP in one week  Please request your primary care physician to go over all Hospital Tests and Procedure/Radiological results at the follow up,  Please get all Hospital records sent to your PCP by signing hospital release before you go home.   Do not take more than prescribed Pain, Sleep and Anxiety Medications. You were cared for by a hospitalist during your hospital stay. If you have any questions about your discharge medications or the care you received while you were in the hospital after you are discharged, you can call the unit and ask to speak with the hospitalist on call if the hospitalist that took care of you is not available.  Once you are discharged, your primary  care physician will handle any further medical issues. Please note that NO REFILLS for any discharge medications will be authorized once you are discharged, as it is imperative that you return to your primary care physician (or establish a relationship with a primary care physician if you do not have one) for your aftercare needs so that they can reassess your need for medications and monitor your lab values. You Must read complete instructions/literature along with all the possible adverse reactions/side effects for all the Medicines you take and that have been prescribed to you. Take any new Medicines after you have completely understood and accept all the possible adverse  reactions/side effects. Wear Seat belts while driving. If you have smoked or chewed Tobacco in the last 2 yrs please stop smoking and/or stop any Recreational drug use.   Increase activity slowly    Complete by:  As directed    Increase activity slowly    Complete by:  As directed      Discharge Exam: Filed Weights   01/18/17 1852  Weight: 69.4 kg (153 lb)   Vitals:   01/21/17 0424 01/21/17 1018  BP: 120/69 110/60  Pulse: 72 76  Resp: 18   Temp: 98 F (36.7 C)   SpO2: 97%    General: Appear in no distress, no Rash; Oral Mucosa moist. Cardiovascular: S1 and S2 Present, no Murmur, no JVD Respiratory: Bilateral Air entry present and Clear to Auscultation, no Crackles, n wheezes Abdomen: Bowel Sound present, Soft and no tenderness Extremities: no Pedal edema, no calf tenderness Neurology: Grossly no focal neuro deficit.  The results of significant diagnostics from this hospitalization (including imaging, microbiology, ancillary and laboratory) are listed below for reference.    Significant Diagnostic Studies: Ct Abdomen Pelvis W Contrast  Result Date: 01/08/2017 CLINICAL DATA:  Right flank pain. Vomiting. Symptoms beginning last week. EXAM: CT ABDOMEN AND PELVIS WITH CONTRAST TECHNIQUE: Multidetector CT imaging  of the abdomen and pelvis was performed using the standard protocol following bolus administration of intravenous contrast. CONTRAST:  100mL ISOVUE-300 IOPAMIDOL (ISOVUE-300) INJECTION 61% COMPARISON:  None. FINDINGS: Lower chest: Clear lung bases.  Heart normal size. Hepatobiliary: Liver demonstrates surface nodularity relative enlargement of the caudate lobe and central volume loss. No liver mass or focal lesion. No bile duct dilation. Gallbladder wall appears mildly thickened. There is no pericholecystic inflammation. No bile duct dilation. Pancreas: Unremarkable. No pancreatic ductal dilatation or surrounding inflammatory changes. Spleen: Enlarged measuring 19 x 8.5 x 14 cm. No splenic mass or focal lesion. Adrenals/Urinary Tract: No renal masses, stones or hydronephrosis. Symmetric renal enhancement and excretion. Normal ureters. Bladder wall is mildly thickened. No bladder mass or stone. Stomach/Bowel: Stomach, small bowel and colon are unremarkable. Vascular/Lymphatic: No significant vascular findings are present. No enlarged abdominal or pelvic lymph nodes. Reproductive: Uterus and bilateral adnexa are unremarkable. Other: Trace amount ascites is seen adjacent to the liver. No hernia. Musculoskeletal: No acute or significant osseous findings. IMPRESSION: 1. Mild bladder wall thickening. This may reflect cystitis in warrants clinical correlation. 2. No other evidence of an acute abnormality. 3. Findings are consistent with cirrhosis and portal venous hypertension, the latter reflected by a trace amount of ascites and splenomegaly. Electronically Signed   By: Amie Portlandavid  Ormond M.D.   On: 01/08/2017 07:37   [place] Paracentesis  Result Date: 01/19/2017 INDICATION: Alcoholic cirrhosis, ascites. Request made for diagnostic and therapeutic paracentesis . EXAM: ULTRASOUND GUIDED DIAGNOSTIC AND THERAPEUTIC PARACENTESIS MEDICATIONS: None. COMPLICATIONS: None immediate. PROCEDURE: Informed written consent was obtained from  the patient after a discussion of the risks, benefits and alternatives to treatment. A timeout was performed prior to the initiation of the procedure. Initial ultrasound scanning demonstrates a amount of ascites within the left lower abdominal quadrant. The left lower abdomen was prepped and draped in the usual sterile fashion. 1% lidocaine was used for local anesthesia. Following this, a Yueh catheter was introduced. An ultrasound image was saved for documentation purposes. The paracentesis was performed. The catheter was removed and a dressing was applied. The patient tolerated the procedure well without immediate post procedural complication. FINDINGS: A total of approximately 1.8 liters of hazy, yellow fluid was  removed. Samples were sent to the laboratory as requested by the clinical team. IMPRESSION: Successful ultrasound-guided diagnostic and therapeutic paracentesis yielding 1.8 liters of peritoneal fluid. Read by: Jeananne Rama, PA-C Electronically Signed   By: Irish Lack M.D.   On: 01/19/2017 15:01    Microbiology: Recent Results (from the past 240 hour(s))  Culture, blood (Routine X 2) w Reflex to ID Panel     Status: None (Preliminary result)   Collection Time: 01/18/17  8:37 PM  Result Value Ref Range Status   Specimen Description BLOOD RIGHT ANTECUBITAL  Final   Special Requests   Final    BOTTLES DRAWN AEROBIC AND ANAEROBIC Blood Culture adequate volume   Culture   Final    NO GROWTH 1 DAY Performed at [facility], [address] [place], [place] [address]    Report Status PENDING  Incomplete  Culture, blood (Routine X 2) w Reflex to ID Panel     Status: None (Preliminary result)   Collection Time: 01/18/17  9:18 PM  Result Value Ref Range Status   Specimen Description BLOOD BLOOD RIGHT HAND  Final   Special Requests   Final    IN PEDIATRIC BOTTLE Blood Culture results may not be optimal due to an excessive volume of blood received in culture bottles   Culture   Final     NO GROWTH 1 DAY Performed at [facility], [address], [place], [place] [address]    Report Status PENDING  Incomplete  Body fluid culture     Status: None (Preliminary result)   Collection Time: 01/19/17  3:18 PM  Result Value Ref Range Status   Specimen Description PERITONEAL  Final   Special Requests NONE  Final   Gram Stain   Final    MODERATE WBC PRESENT, PREDOMINANTLY MONONUCLEAR NO ORGANISMS SEEN    Culture   Final    NO GROWTH 2 DAYS Performed at [facility], [address], [place], [place] [address]    Report Status PENDING  Incomplete     Labs: CBC:  Recent Labs Lab 01/18/17 1923 01/19/17 0417 01/20/17 0417 01/21/17 0445  WBC 5.0 3.6* 3.1* 3.1*  NEUTROABS  --   --  1.8 1.9  HGB 10.9* 10.7* 10.6* 11.1*  HCT 31.9* 31.9* 30.9* 33.2*  MCV 89.4 90.1 88.8 89.5  PLT 113* 64* 57* 58*   Basic Metabolic Panel:  Recent Labs Lab 01/18/17 1923 01/19/17 0417 01/20/17 0417 01/21/17 0445  NA 134* 138 135 136  K 4.2 3.5 3.8 3.9  CL 105 107 103 101  CO2 22 25 26 28   GLUCOSE 206* 152* 137* 130*  BUN 9 8 8 9   CREATININE 0.54 0.78 0.73 0.73  CALCIUM 8.5* 8.7* 8.7* 9.1  MG  --   --  1.4* 1.8   Liver Function Tests:  Recent Labs Lab 01/18/17 1923 01/19/17 0417 01/20/17 0417 01/21/17 0445  AST 64* 41 35 42*  ALT 30 28 25 27   ALKPHOS 145* 135* 113 142*  BILITOT 6.1* 5.0* 5.6* 4.5*  PROT 6.3* 6.0* 5.7* 6.4*  ALBUMIN 3.1* 2.7* 2.6* 2.9*    Recent Labs Lab 01/18/17 1923  LIPASE 61*    Recent Labs Lab 01/18/17 1923 01/19/17 0417 01/20/17 0455  AMMONIA 88* 51* 43*   Cardiac Enzymes: No results for input(s): CKTOTAL, CKMB, CKMBINDEX, TROPONINI in the last 168 hours. BNP (last 3 results) No results for input(s): BNP in the last 8760 hours. CBG:  Recent Labs Lab 01/20/17 1142 01/20/17 1659 01/20/17 2112 01/21/17 0748 01/21/17 1200  GLUCAP 166* 156* 175* 110* 155*   Time spent: 35 minutes  Signed:  Dianne Bady,  Huck Ashworth  [facility] 01/21/2017 01/21/2017  , 3:14 PM

## 2017-01-21 NOTE — Progress Notes (Signed)
Once d/c summary is placed will fax to #[phone] [facility] service rep Roxanne [email or web address] notified.

## 2017-01-23 LAB — BODY FLUID CULTURE: Culture: NO GROWTH

## 2017-01-24 LAB — CULTURE, BLOOD (ROUTINE X 2)
CULTURE: NO GROWTH
Culture: NO GROWTH
Special Requests: ADEQUATE

## 2017-02-20 ENCOUNTER — Encounter (HOSPITAL_COMMUNITY): Payer: Self-pay | Admitting: Emergency Medicine

## 2017-02-20 ENCOUNTER — Emergency Department (HOSPITAL_COMMUNITY)
Admission: EM | Admit: 2017-02-20 | Discharge: 2017-02-20 | Discharge status: Against Medical Advice | Payer: Medicaid Other | Attending: Emergency Medicine | Admitting: Emergency Medicine

## 2017-02-20 DIAGNOSIS — Z5321 Procedure and treatment not carried out due to patient leaving prior to being seen by health care provider: Secondary | ICD-10-CM | POA: Insufficient documentation

## 2017-02-20 LAB — BASIC METABOLIC PANEL
Anion gap: 7 (ref 5–15)
BUN: 9 mg/dL (ref 6–20)
CO2: 26 mmol/L (ref 22–32)
Calcium: 9.2 mg/dL (ref 8.9–10.3)
Chloride: 102 mmol/L (ref 101–111)
Creatinine, Ser: 0.6 mg/dL (ref 0.44–1.00)
Glucose, Bld: 205 mg/dL — ABNORMAL HIGH (ref 65–99)
POTASSIUM: 3.7 mmol/L (ref 3.5–5.1)
SODIUM: 135 mmol/L (ref 135–145)

## 2017-02-20 LAB — CBC
HEMATOCRIT: 32.4 % — AB (ref 36.0–46.0)
Hemoglobin: 10.9 g/dL — ABNORMAL LOW (ref 12.0–15.0)
MCH: 29.9 pg (ref 26.0–34.0)
MCHC: 33.6 g/dL (ref 30.0–36.0)
MCV: 89 fL (ref 78.0–100.0)
Platelets: 55 10*3/uL — ABNORMAL LOW (ref 150–400)
RBC: 3.64 MIL/uL — AB (ref 3.87–5.11)
RDW: 15.7 % — AB (ref 11.5–15.5)
WBC: 3.5 10*3/uL — AB (ref 4.0–10.5)

## 2017-02-20 LAB — CBG MONITORING, ED: Glucose-Capillary: 212 mg/dL — ABNORMAL HIGH (ref 65–99)

## 2017-02-20 NOTE — ED Notes (Signed)
Pt called for treatment room x 2, no answer.  EMT first states pt is not in the lobby.

## 2017-02-20 NOTE — ED Triage Notes (Signed)
Per EMS, patient from home, c/o hyperglycemia since today. CBG 337 with EMS. Hx cirrhosis. Ambulatory. Denies abdominal pain, N/V/D, chest pain and SOB.   BP 116/72 HR 74 98% RA

## 2017-02-20 NOTE — ED Notes (Signed)
Pt called for a treatment room but no answer. EMT first states pt is not in the lobby.

## 2017-03-05 ENCOUNTER — Encounter (HOSPITAL_COMMUNITY): Payer: Self-pay

## 2017-03-05 ENCOUNTER — Ambulatory Visit (HOSPITAL_COMMUNITY)
Admission: EM | Admit: 2017-03-05 | Discharge: 2017-03-05 | Disposition: A | Discharge status: Home / Self Care | Payer: Medicaid Other

## 2017-03-05 DIAGNOSIS — R350 Frequency of micturition: Secondary | ICD-10-CM | POA: Insufficient documentation

## 2017-03-05 DIAGNOSIS — I10 Essential (primary) hypertension: Secondary | ICD-10-CM | POA: Insufficient documentation

## 2017-03-05 DIAGNOSIS — N3000 Acute cystitis without hematuria: Secondary | ICD-10-CM | POA: Insufficient documentation

## 2017-03-05 DIAGNOSIS — J449 Chronic obstructive pulmonary disease, unspecified: Secondary | ICD-10-CM | POA: Diagnosis not present

## 2017-03-05 DIAGNOSIS — E119 Type 2 diabetes mellitus without complications: Secondary | ICD-10-CM | POA: Insufficient documentation

## 2017-03-05 DIAGNOSIS — R110 Nausea: Secondary | ICD-10-CM | POA: Insufficient documentation

## 2017-03-05 DIAGNOSIS — R1030 Lower abdominal pain, unspecified: Secondary | ICD-10-CM | POA: Diagnosis present

## 2017-03-05 DIAGNOSIS — Z7984 Long term (current) use of oral hypoglycemic drugs: Secondary | ICD-10-CM | POA: Insufficient documentation

## 2017-03-05 DIAGNOSIS — F17210 Nicotine dependence, cigarettes, uncomplicated: Secondary | ICD-10-CM | POA: Insufficient documentation

## 2017-03-05 LAB — CBC
HCT: 35.5 % — ABNORMAL LOW (ref 36.0–46.0)
Hemoglobin: 12 g/dL (ref 12.0–15.0)
MCH: 29.9 pg (ref 26.0–34.0)
MCHC: 33.8 g/dL (ref 30.0–36.0)
MCV: 88.3 fL (ref 78.0–100.0)
PLATELETS: 63 10*3/uL — AB (ref 150–400)
RBC: 4.02 MIL/uL (ref 3.87–5.11)
RDW: 16.1 % — AB (ref 11.5–15.5)
WBC: 5.1 10*3/uL (ref 4.0–10.5)

## 2017-03-05 LAB — COMPREHENSIVE METABOLIC PANEL
ALT: 26 U/L (ref 14–54)
ANION GAP: 9 (ref 5–15)
AST: 37 U/L (ref 15–41)
Albumin: 3.2 g/dL — ABNORMAL LOW (ref 3.5–5.0)
Alkaline Phosphatase: 143 U/L — ABNORMAL HIGH (ref 38–126)
BUN: 10 mg/dL (ref 6–20)
CHLORIDE: 107 mmol/L (ref 101–111)
CO2: 22 mmol/L (ref 22–32)
CREATININE: 0.65 mg/dL (ref 0.44–1.00)
Calcium: 9 mg/dL (ref 8.9–10.3)
GLUCOSE: 121 mg/dL — AB (ref 65–99)
Potassium: 4 mmol/L (ref 3.5–5.1)
Sodium: 138 mmol/L (ref 135–145)
Total Bilirubin: 5 mg/dL — ABNORMAL HIGH (ref 0.3–1.2)
Total Protein: 6.8 g/dL (ref 6.5–8.1)

## 2017-03-05 LAB — URINALYSIS, ROUTINE W REFLEX MICROSCOPIC
Bilirubin Urine: NEGATIVE
Glucose, UA: NEGATIVE mg/dL
Hgb urine dipstick: NEGATIVE
Ketones, ur: NEGATIVE mg/dL
Nitrite: POSITIVE — AB
PROTEIN: NEGATIVE mg/dL
SPECIFIC GRAVITY, URINE: 1.006 (ref 1.005–1.030)
pH: 7 (ref 5.0–8.0)

## 2017-03-05 LAB — LIPASE, BLOOD: LIPASE: 107 U/L — AB (ref 11–51)

## 2017-03-05 MED ORDER — ONDANSETRON 4 MG PO TBDP: 4.0000 mg | ORAL_TABLET | Freq: Once | ORAL | Status: DC | PRN

## 2017-03-05 NOTE — ED Triage Notes (Signed)
Pt reports 8/10 abd pain, n/v, and diarrhea. Hx of Cirrhosis. Ambulatory to triage.

## 2017-03-06 ENCOUNTER — Encounter (HOSPITAL_COMMUNITY): Payer: Self-pay | Admitting: Emergency Medicine

## 2017-03-06 ENCOUNTER — Emergency Department (HOSPITAL_COMMUNITY)
Admission: EM | Admit: 2017-03-06 | Discharge: 2017-03-06 | Disposition: A | Discharge status: Home / Self Care | Payer: Medicaid Other | Attending: Emergency Medicine | Admitting: Emergency Medicine

## 2017-03-06 DIAGNOSIS — N3000 Acute cystitis without hematuria: Secondary | ICD-10-CM

## 2017-03-06 LAB — CBG MONITORING, ED: Glucose-Capillary: 108 mg/dL — ABNORMAL HIGH (ref 65–99)

## 2017-03-06 MED ORDER — DICYCLOMINE HCL 10 MG PO CAPS
10.0000 mg | ORAL_CAPSULE | Freq: Once | ORAL | Status: AC
  Administered 2017-03-06: 10 mg via ORAL
  Filled 2017-03-06: qty 1

## 2017-03-06 MED ORDER — NITROFURANTOIN MONOHYD MACRO 100 MG PO CAPS: 100.0000 mg | ORAL_CAPSULE | Freq: Two times a day (BID) | ORAL | 0 refills | Status: DC

## 2017-03-06 MED ORDER — NITROFURANTOIN MONOHYD MACRO 100 MG PO CAPS
100.0000 mg | ORAL_CAPSULE | Freq: Once | ORAL | Status: AC
  Administered 2017-03-06: 100 mg via ORAL
  Filled 2017-03-06: qty 1

## 2017-03-06 NOTE — ED Notes (Signed)
ED Provider at bedside. 

## 2017-03-06 NOTE — ED Provider Notes (Signed)
[facility]-EMERGENCY DEPT Provider Note   CSN: [id number] Arrival date & time: 03/05/17  1834     History   Chief Complaint Chief Complaint  Patient presents with  . Abdominal Pain    HPI Katrina Cohen is a 51 y.o. female.  The history is provided by the patient.  Abdominal Pain   This is a new problem. The current episode started more than 2 days ago. The problem occurs constantly. The problem has not changed since onset.The pain is associated with an unknown factor. The pain is located in the suprapubic region. The pain is moderate. Associated symptoms include nausea and frequency. Pertinent negatives include fever. Associated symptoms comments: Smelling urine. Nothing aggravates the symptoms. Nothing relieves the symptoms. Past workup does not include barium enema. Her past medical history does not include ulcerative colitis, Crohn's disease or irritable bowel syndrome.    Past Medical History:  Diagnosis Date  . Bipolar disorder (HCC) 1980s   with depression, anxiety  . Cirrhosis (HCC)   . Depression with anxiety 1980s   [facility] admission 05/2011.    . Diabetes mellitus   . Dysphagia 11/2014   normal esophagram 11/2014. EGD per Dr Dulce Sellar.   . Hypertension   . Jaundice 04/2015   In setting of infectious mononucleosis. Ultrasound in [place]: hepatosplenomegaly, portal htn, possible cirrhosis  . Liver disease   . Mononucleosis 04/15/2015    Patient Active Problem List   Diagnosis Date Noted  . Encephalopathy, hepatic (HCC)   . Acute encephalopathy 01/18/2017  . Right upper quadrant abdominal pain 04/02/2016  . UTI (urinary tract infection) 04/02/2016  . Cholelithiasis 04/02/2016  . Depression with anxiety 04/02/2016  . Chronic pain 04/02/2016  . SOB (shortness of breath) 11/12/2015  . COPD exacerbation (HCC) 11/12/2015  . Acute respiratory failure with hypoxia (HCC) 11/12/2015  . CAP (community acquired pneumonia) 11/07/2015  . Cirrhosis (HCC) 11/07/2015  .  Thrombocytopenia (HCC) 11/07/2015  . Alcohol abuse 04/30/2015  . Acute hepatitis 04/30/2015  . Tobacco abuse 04/30/2015  . Malnutrition of moderate degree 04/29/2015  . Hypokalemia 04/29/2015  . Anemia 04/29/2015  . Hyponatremia 04/29/2015  . Coagulopathy (HCC) 04/29/2015  . Hypoalbuminemia 04/29/2015  . Mononucleosis 04/28/2015  . Vitamin D deficiency 06/11/2011  . Major depression (HCC) 06/10/2011    Past Surgical History:  Procedure Laterality Date  . APPENDECTOMY    . CESAREAN SECTION    . COLONOSCOPY    . ESOPHAGOGASTRODUODENOSCOPY     Dr Dulce Sellar ? 2016.   . IR GENERIC HISTORICAL  04/20/2016   IR PARACENTESIS 04/20/2016 Darrell K Allred, PA-C [facility]-INTER[facility] RAD    OB History    No data available       Home Medications    Prior to Admission medications   Medication Sig Start Date End Date Taking? Authorizing Provider  albuterol (PROVENTIL HFA;VENTOLIN HFA) 108 (90 Base) MCG/ACT inhaler Inhale 2 puffs into the lungs every 4 (four) hours as needed for wheezing or shortness of breath.  09/02/15   [provider]  albuterol (PROVENTIL) (2.5 MG/3ML) 0.083% nebulizer solution Take 3 mLs (2.5 mg total) by nebulization every 4 (four) hours as needed for wheezing or shortness of breath. 11/13/15   Standley Brooking, MD  ARIPiprazole (ABILIFY) 5 MG tablet Take 5 mg by mouth at bedtime.  04/02/15   [provider]  buPROPion (WELLBUTRIN SR) 150 MG 12 hr tablet Take 150 mg by mouth 2 (two) times daily.  04/20/16 04/20/17  [provider]  citalopram (CELEXA)  20 MG tablet Take 20 mg by mouth at bedtime.     [provider]  furosemide (LASIX) 20 MG tablet Take 1 tablet (20 mg total) by mouth daily. 01/21/17   Rolly Salter, MD  glyBURIDE (DIABETA) 2.5 MG tablet Take 2.5 mg by mouth daily as needed (high blood sugar).  03/04/16 03/04/17  [provider]  lactulose (CHRONULAC) 10 GM/15ML solution Take 45 mLs (30 g total) by mouth 3 (three) times  daily. 01/21/17   Rolly Salter, MD  Liniments (BLUE-EMU SUPER STRENGTH) CREA Apply 1 application topically as needed (For cramping in hands or neck.).    [provider]  LORazepam (ATIVAN) 1 MG tablet Take 1 mg by mouth every 8 (eight) hours as needed for anxiety.     [provider]  methocarbamol (ROBAXIN) 500 MG tablet Take 1 tablet (500 mg total) by mouth 2 (two) times daily. Patient taking differently: Take 500 mg by mouth 2 (two) times daily as needed for muscle spasms.  11/09/16   Audry Pili, PA-C  metroNIDAZOLE (METROGEL) 0.75 % vaginal gel Place 1 Applicatorful vaginally 2 (two) times daily. 01/07/17   Dorena Bodo, NP  MILK THISTLE PO Take 1 tablet by mouth 3 (three) times daily.    [provider]  morphine (MS CONTIN) 30 MG 12 hr tablet Take 30 mg by mouth 2 (two) times daily. 11/05/16   [provider]  Multiple Vitamins-Minerals (MULTIVITAMIN ADULTS) TABS Take 2 tablets by mouth daily.    [provider]  nitrofurantoin, macrocrystal-monohydrate, (MACROBID) 100 MG capsule Take 1 capsule (100 mg total) by mouth 2 (two) times daily. X 7 days 03/06/17   Rulon Abdalla, MD  ondansetron (ZOFRAN ODT) 4 MG disintegrating tablet Take 1 tablet (4 mg total) by mouth every 8 (eight) hours as needed for nausea or vomiting. 01/07/17   Dorena Bodo, NP  OVER THE COUNTER MEDICATION Take 2 capsules by mouth 2 (two) times daily. Nettles    [provider]  oxyCODONE (ROXICODONE) 15 MG immediate release tablet Take 15 mg by mouth every 8 (eight) hours as needed for pain.    [provider]  phenazopyridine (PYRIDIUM) 200 MG tablet Take 1 tablet (200 mg total) by mouth 3 (three) times daily as needed for pain. 01/07/17   Dorena Bodo, NP  promethazine (PHENERGAN) 25 MG tablet Take 25 mg by mouth every 6 (six) hours as needed for nausea or vomiting.    [provider]  spironolactone (ALDACTONE) 50 MG tablet Take 100 mg by  mouth 2 (two) times daily.     [provider]  Vitamin D, Ergocalciferol, (DRISDOL) 50000 units CAPS capsule Take 50,000 Units by mouth 2 (two) times a week. 12/23/16 12/23/17  [provider]    Family History Family History  Problem Relation Age of Onset  . Diabetes Mother   . Cancer Father   . Diabetes Sister   . Diabetes Brother     Social History Social History  Substance Use Topics  . Smoking status: Current Every Day Smoker    Packs/day: 0.50    Types: Cigarettes  . Smokeless tobacco: Never Used  . Alcohol use No     Comment: sober for over 1 year     Allergies   Tizanidine   Review of Systems Review of Systems  Constitutional: Negative for fever.  Gastrointestinal: Positive for abdominal pain and nausea.  Genitourinary: Positive for frequency.  All other systems reviewed and are negative.  Physical Exam Updated Vital Signs BP 122/60 (BP Location: Left Arm)   Pulse 71   Temp 97.9 F (36.6 C) (Oral)   Resp 18   Ht  (1.626 m)   Wt 67.6 kg (149 lb)   LMP 01/29/2013   SpO2 100%   BMI 25.58 kg/m   Physical Exam  Constitutional: She is oriented to person, place, and time. She appears well-developed and well-nourished. No distress.  HENT:  Head: Normocephalic and atraumatic.  Mouth/Throat: No oropharyngeal exudate.  Eyes: Pupils are equal, round, and reactive to light. Conjunctivae are normal.  Neck: Normal range of motion. Neck supple.  Cardiovascular: Normal rate, regular rhythm, normal heart sounds and intact distal pulses.   Pulmonary/Chest: Effort normal and breath sounds normal. She has no wheezes. She has no rales.  Abdominal: Soft. Bowel sounds are normal. She exhibits no mass. There is no tenderness. There is no rebound and no guarding.  Musculoskeletal: Normal range of motion.  Neurological: She is alert and oriented to person, place, and time. She displays normal reflexes.  Skin: Skin is warm and dry. Capillary refill  takes less than 2 seconds.  Psychiatric: She has a normal mood and affect.     ED Treatments / Results  Labs (all labs ordered are listed, but only abnormal results are displayed) Results for orders placed or performed during the hospital encounter of 03/06/17  Lipase, blood  Result Value Ref Range   Lipase 107 (H) 11 - 51 U/L  Comprehensive metabolic panel  Result Value Ref Range   Sodium 138 135 - 145 mmol/L   Potassium 4.0 3.5 - 5.1 mmol/L   Chloride 107 101 - 111 mmol/L   CO2 22 22 - 32 mmol/L   Glucose, Bld 121 (H) 65 - 99 mg/dL   BUN 10 6 - 20 mg/dL   Creatinine, Ser 4.09 0.44 - 1.00 mg/dL   Calcium 9.0 8.9 - 81.1 mg/dL   Total Protein 6.8 6.5 - 8.1 g/dL   Albumin 3.2 (L) 3.5 - 5.0 g/dL   AST 37 15 - 41 U/L   ALT 26 14 - 54 U/L   Alkaline Phosphatase 143 (H) 38 - 126 U/L   Total Bilirubin 5.0 (H) 0.3 - 1.2 mg/dL   GFR calc non Af Amer >60 >60 mL/min   GFR calc Af Amer >60 >60 mL/min   Anion gap 9 5 - 15  CBC  Result Value Ref Range   WBC 5.1 4.0 - 10.5 K/uL   RBC 4.02 3.87 - 5.11 MIL/uL   Hemoglobin 12.0 12.0 - 15.0 g/dL   HCT 91.4 (L) 78.2 - 95.6 %   MCV 88.3 78.0 - 100.0 fL   MCH 29.9 26.0 - 34.0 pg   MCHC 33.8 30.0 - 36.0 g/dL   RDW 21.3 (H) 08.6 - 57.8 %   Platelets 63 (L) 150 - 400 K/uL  Urinalysis, Routine w reflex microscopic  Result Value Ref Range   Color, Urine YELLOW YELLOW   APPearance CLOUDY (A) CLEAR   Specific Gravity, Urine 1.006 1.005 - 1.030   pH 7.0 5.0 - 8.0   Glucose, UA NEGATIVE NEGATIVE mg/dL   Hgb urine dipstick NEGATIVE NEGATIVE   Bilirubin Urine NEGATIVE NEGATIVE   Ketones, ur NEGATIVE NEGATIVE mg/dL   Protein, ur NEGATIVE NEGATIVE mg/dL   Nitrite POSITIVE (A) NEGATIVE   Leukocytes, UA LARGE (A) NEGATIVE   RBC / HPF 0-5 0 - 5 RBC/hpf   WBC, UA TOO NUMEROUS TO COUNT 0 -  5 WBC/hpf   Bacteria, UA RARE (A) NONE SEEN   Squamous Epithelial / LPF 0-5 (A) NONE SEEN   WBC Clumps PRESENT    Budding Yeast PRESENT   CBG monitoring, ED    Result Value Ref Range   Glucose-Capillary 108 (H) 65 - 99 mg/dL   No results found.   Radiology No results found.  Procedures Procedures (including critical care time)  Medications Ordered in ED Medications  ondansetron (ZOFRAN-ODT) disintegrating tablet 4 mg (not administered)  nitrofurantoin (macrocrystal-monohydrate) (MACROBID) capsule 100 mg (100 mg Oral Given 03/06/17 0545)  dicyclomine (BENTYL) capsule 10 mg (10 mg Oral Given 03/06/17 0545)      Final Clinical Impressions(s) / ED Diagnoses   Final diagnoses:  Acute cystitis without hematuria   Symptoms consistent a UTI.  Exam and vitals are benign and reassuring.    Strict return precautions given for  Shortness of breath, swelling or the lips or tongue, chest pain, dyspnea on exertion, new weakness or numbness changes in vision or speech,  Inability to tolerate liquids or food, changes in voice cough, altered mental status or any concerns. No signs of systemic illness or infection. The patient is nontoxic-appearing on exam and vital signs are within normal limits.    I have reviewed the triage vital signs and the nursing notes. Pertinent labs &imaging results that were available during my care of the patient were reviewed by me and considered in my medical decision making (see chart for details).  After history, exam, and medical workup I feel the patient has been appropriately medically screened and is safe for discharge home. Pertinent diagnoses were discussed with the patient. Patient was given return precautions.   New Prescriptions New Prescriptions   NITROFURANTOIN, MACROCRYSTAL-MONOHYDRATE, (MACROBID) 100 MG CAPSULE    Take 1 capsule (100 mg total) by mouth 2 (two) times daily. X 7 days     Cy Blamer, MD 03/06/17 [phone]

## 2017-03-22 ENCOUNTER — Encounter (HOSPITAL_COMMUNITY): Payer: Self-pay | Admitting: Emergency Medicine

## 2017-03-22 ENCOUNTER — Emergency Department (HOSPITAL_COMMUNITY)
Admission: EM | Admit: 2017-03-22 | Discharge: 2017-03-23 | Disposition: A | Discharge status: Home / Self Care | Payer: Medicaid Other | Attending: Emergency Medicine | Admitting: Emergency Medicine

## 2017-03-22 DIAGNOSIS — K7031 Alcoholic cirrhosis of liver with ascites: Secondary | ICD-10-CM

## 2017-03-22 DIAGNOSIS — E119 Type 2 diabetes mellitus without complications: Secondary | ICD-10-CM | POA: Diagnosis not present

## 2017-03-22 DIAGNOSIS — J449 Chronic obstructive pulmonary disease, unspecified: Secondary | ICD-10-CM | POA: Insufficient documentation

## 2017-03-22 DIAGNOSIS — F17210 Nicotine dependence, cigarettes, uncomplicated: Secondary | ICD-10-CM | POA: Diagnosis not present

## 2017-03-22 DIAGNOSIS — I10 Essential (primary) hypertension: Secondary | ICD-10-CM | POA: Insufficient documentation

## 2017-03-22 DIAGNOSIS — R188 Other ascites: Secondary | ICD-10-CM

## 2017-03-22 DIAGNOSIS — Z7984 Long term (current) use of oral hypoglycemic drugs: Secondary | ICD-10-CM | POA: Diagnosis not present

## 2017-03-22 DIAGNOSIS — Z79899 Other long term (current) drug therapy: Secondary | ICD-10-CM | POA: Insufficient documentation

## 2017-03-22 DIAGNOSIS — R109 Unspecified abdominal pain: Secondary | ICD-10-CM | POA: Diagnosis present

## 2017-03-22 LAB — CBC WITH DIFFERENTIAL/PLATELET
Basophils Absolute: 0 10*3/uL (ref 0.0–0.1)
Basophils Relative: 0 %
Eosinophils Absolute: 0.1 10*3/uL (ref 0.0–0.7)
Eosinophils Relative: 1 %
HCT: 33.5 % — ABNORMAL LOW (ref 36.0–46.0)
HEMOGLOBIN: 11.1 g/dL — AB (ref 12.0–15.0)
LYMPHS ABS: 0.9 10*3/uL (ref 0.7–4.0)
LYMPHS PCT: 19 %
MCH: 29.4 pg (ref 26.0–34.0)
MCHC: 33.1 g/dL (ref 30.0–36.0)
MCV: 88.9 fL (ref 78.0–100.0)
Monocytes Absolute: 0.4 10*3/uL (ref 0.1–1.0)
Monocytes Relative: 9 %
NEUTROS PCT: 71 %
Neutro Abs: 3.2 10*3/uL (ref 1.7–7.7)
Platelets: 56 10*3/uL — ABNORMAL LOW (ref 150–400)
RBC: 3.77 MIL/uL — AB (ref 3.87–5.11)
RDW: 16.4 % — ABNORMAL HIGH (ref 11.5–15.5)
WBC: 4.5 10*3/uL (ref 4.0–10.5)

## 2017-03-22 LAB — COMPREHENSIVE METABOLIC PANEL
ALT: 23 U/L (ref 14–54)
AST: 40 U/L (ref 15–41)
Albumin: 2.9 g/dL — ABNORMAL LOW (ref 3.5–5.0)
Alkaline Phosphatase: 131 U/L — ABNORMAL HIGH (ref 38–126)
Anion gap: 12 (ref 5–15)
BUN: 8 mg/dL (ref 6–20)
CHLORIDE: 102 mmol/L (ref 101–111)
CO2: 23 mmol/L (ref 22–32)
Calcium: 8.8 mg/dL — ABNORMAL LOW (ref 8.9–10.3)
Creatinine, Ser: 0.85 mg/dL (ref 0.44–1.00)
Glucose, Bld: 213 mg/dL — ABNORMAL HIGH (ref 65–99)
POTASSIUM: 3.6 mmol/L (ref 3.5–5.1)
Sodium: 137 mmol/L (ref 135–145)
Total Bilirubin: 4.4 mg/dL — ABNORMAL HIGH (ref 0.3–1.2)
Total Protein: 6.5 g/dL (ref 6.5–8.1)

## 2017-03-22 LAB — PROTIME-INR
INR: 1.65
Prothrombin Time: 19.3 seconds — ABNORMAL HIGH (ref 11.4–15.2)

## 2017-03-22 LAB — LIPASE, BLOOD: Lipase: 91 U/L — ABNORMAL HIGH (ref 11–51)

## 2017-03-22 NOTE — Discharge Instructions (Signed)
Radiology will call you tomorrow to set up your ultrasound paracentesis to drain your abdomen. Return if you develop increased pain, or vomiting/fevers or other symptoms.

## 2017-03-22 NOTE — ED Triage Notes (Signed)
Brought in by EMS from home with c/o abdominal pain and back pain, onset 2 days ago.  Pt reported that she has gained "16 lbs within 2 days"----- has hx of liver cirrhosis and ascites.  Pt's abdomen grossly distended.  Pt denies N/V/D.

## 2017-03-22 NOTE — ED Notes (Signed)
Bed: [id number] Expected date:  Expected time:  Means of arrival:  Comments: 51 yo F  abd pain, back pain

## 2017-03-22 NOTE — ED Provider Notes (Signed)
[facility]-EMERGENCY DEPT Provider Note   CSN: [id number] Arrival date & time: 03/22/17  2126     History   Chief Complaint Chief Complaint  Patient presents with  . Abdominal Pain    HPI Katrina Cohen is a 51 y.o. female.  HPI  51 year old female with a history of alcoholic cirrhosis presents with abdominal pain and distention.  This is been going on for about 3 days.  She states over the last 3 days she has gained 15 pounds.  She has had very similar symptoms before with ascites.  She is never had SBP despite multiple Paracentesis.  She has not had any fevers or vomiting.  She states that every time her abdomen swells with fluid it feels like this level of discomfort in her abdomen.  It sometimes hurts to breathe but no shortness of breath.  No cough.  No urinary symptoms.  She recently got over a UTI and those symptoms have resolved.  Past Medical History:  Diagnosis Date  . Bipolar disorder (HCC) 1980s   with depression, anxiety  . Cirrhosis (HCC)   . Depression with anxiety 1980s   [facility] admission 05/2011.    . Diabetes mellitus   . Dysphagia 11/2014   normal esophagram 11/2014. EGD per Dr Dulce Sellar.   . Hypertension   . Jaundice 04/2015   In setting of infectious mononucleosis. Ultrasound in [place]: hepatosplenomegaly, portal htn, possible cirrhosis  . Liver disease   . Mononucleosis 04/15/2015    Patient Active Problem List   Diagnosis Date Noted  . Encephalopathy, hepatic (HCC)   . Acute encephalopathy 01/18/2017  . Right upper quadrant abdominal pain 04/02/2016  . UTI (urinary tract infection) 04/02/2016  . Cholelithiasis 04/02/2016  . Depression with anxiety 04/02/2016  . Chronic pain 04/02/2016  . SOB (shortness of breath) 11/12/2015  . COPD exacerbation (HCC) 11/12/2015  . Acute respiratory failure with hypoxia (HCC) 11/12/2015  . CAP (community acquired pneumonia) 11/07/2015  . Cirrhosis (HCC) 11/07/2015  . Thrombocytopenia (HCC)  11/07/2015  . Alcohol abuse 04/30/2015  . Acute hepatitis 04/30/2015  . Tobacco abuse 04/30/2015  . Malnutrition of moderate degree 04/29/2015  . Hypokalemia 04/29/2015  . Anemia 04/29/2015  . Hyponatremia 04/29/2015  . Coagulopathy (HCC) 04/29/2015  . Hypoalbuminemia 04/29/2015  . Mononucleosis 04/28/2015  . Vitamin D deficiency 06/11/2011  . Major depression (HCC) 06/10/2011    Past Surgical History:  Procedure Laterality Date  . APPENDECTOMY    . CESAREAN SECTION    . COLONOSCOPY    . ESOPHAGOGASTRODUODENOSCOPY     Dr Dulce Sellar ? 2016.   . IR GENERIC HISTORICAL  04/20/2016   IR PARACENTESIS 04/20/2016 Darrell K Allred, PA-C [facility]    OB History    No data available       Home Medications    Prior to Admission medications   Medication Sig Start Date End Date Taking? Authorizing Provider  albuterol (PROAIR HFA) 108 (90 Base) MCG/ACT inhaler INHALE 2 PUFFS INTO THE LUNGS EVERY 6 HOURS AS NEEDED FOR SHORTNESS OF BREATH 12/12/16  Yes [provider]  albuterol (PROVENTIL) (2.5 MG/3ML) 0.083% nebulizer solution Take 3 mLs (2.5 mg total) by nebulization every 4 (four) hours as needed for wheezing or shortness of breath. 11/13/15  Yes Standley Brooking, MD  ARIPiprazole (ABILIFY) 5 MG tablet Take 5 mg by mouth at bedtime.  04/02/15  Yes [provider]  citalopram (CELEXA) 20 MG tablet TAKE 1 TABLET BY MOUTH EVERY NIGHT 12/20/16  Yes [provider]  glyBURIDE (DIABETA) 2.5 MG tablet TAKE 1 TABLET BY MOUTH DAILY WITH BREAKFAST 02/12/17  Yes [provider]  LORazepam (ATIVAN) 1 MG tablet TAKE 1 TABLET BY MOUTH AT BEDTIME 12/20/16  Yes [provider]  methocarbamol (ROBAXIN) 500 MG tablet Take 1 tablet (500 mg total) by mouth 2 (two) times daily. Patient taking differently: Take 500 mg by mouth 2 (two) times daily as needed for muscle spasms.  11/09/16  Yes Audry Pili, PA-C  Milk Thistle Extract 175 MG TABS Take 1 tablet by mouth  daily.    Yes [provider]  morphine (KADIAN) 20 MG 24 hr capsule Take 20 mg by mouth 2 (two) times daily.   Yes [provider]  Multiple Vitamins-Minerals (MULTIVITAMIN ADULTS) TABS Take 2 tablets by mouth daily.   Yes [provider]  OVER THE COUNTER MEDICATION Take 2 capsules by mouth 2 (two) times daily. Nettles   Yes [provider]  spironolactone (ALDACTONE) 50 MG tablet Take 100 mg by mouth 2 (two) times daily.    Yes [provider]  Vitamin D, Ergocalciferol, (DRISDOL) 50000 units CAPS capsule Take 50,000 Units by mouth 2 (two) times a week. Take on Sun & Wed. 12/23/16 12/23/17 Yes [provider]  furosemide (LASIX) 20 MG tablet Take 1 tablet (20 mg total) by mouth daily. Patient not taking: Reported on 03/22/2017 01/21/17   Rolly Salter, MD  lactulose (CHRONULAC) 10 GM/15ML solution Take 45 mLs (30 g total) by mouth 3 (three) times daily. Patient taking differently: Take 30 g by mouth 3 (three) times daily as needed for moderate constipation.  01/21/17   Rolly Salter, MD  Liniments (BLUE-EMU SUPER STRENGTH) CREA Apply 1 application topically as needed (For cramping in hands or neck.).    [provider]  metroNIDAZOLE (METROGEL) 0.75 % vaginal gel Place 1 Applicatorful vaginally 2 (two) times daily. Patient not taking: Reported on 03/22/2017 01/07/17   Dorena Bodo, NP  nitrofurantoin, macrocrystal-monohydrate, (MACROBID) 100 MG capsule Take 1 capsule (100 mg total) by mouth 2 (two) times daily. X 7 days Patient not taking: Reported on 03/22/2017 03/06/17   Palumbo, April, MD  ondansetron (ZOFRAN ODT) 4 MG disintegrating tablet Take 1 tablet (4 mg total) by mouth every 8 (eight) hours as needed for nausea or vomiting. 01/07/17   Dorena Bodo, NP  phenazopyridine (PYRIDIUM) 200 MG tablet Take 1 tablet (200 mg total) by mouth 3 (three) times daily as needed for pain. Patient not taking: Reported on 03/22/2017  01/07/17   Dorena Bodo, NP  promethazine (PHENERGAN) 25 MG tablet Take 25 mg by mouth every 6 (six) hours as needed for nausea or vomiting.    [provider]  terbinafine (LAMISIL) 1 % cream Apply 1 application topically daily as needed (flareup).  06/25/16 06/25/17  [provider]    Family History Family History  Problem Relation Age of Onset  . Diabetes Mother   . Cancer Father   . Diabetes Sister   . Diabetes Brother     Social History Social History  Substance Use Topics  . Smoking status: Current Every Day Smoker    Packs/day: 0.50    Types: Cigarettes  . Smokeless tobacco: Never Used  . Alcohol use No     Comment: sober for over 1 year     Allergies   Tizanidine   Review of Systems Review of Systems  Constitutional: Negative for fever.  Cardiovascular: Negative for chest  pain.  Gastrointestinal: Positive for abdominal distention and abdominal pain. Negative for nausea and vomiting.  Genitourinary: Negative for dysuria.  All other systems reviewed and are negative.    Physical Exam Updated Vital Signs BP 116/80 (BP Location: Left Arm)   Pulse 80   Temp 98.6 F (37 C) (Oral)   Resp 18   LMP 01/29/2013   SpO2 100%   Physical Exam  Constitutional: She is oriented to person, place, and time. She appears well-developed and well-nourished.  HENT:  Head: Normocephalic and atraumatic.  Right Ear: External ear normal.  Left Ear: External ear normal.  Nose: Nose normal.  Eyes: Right eye exhibits no discharge. Left eye exhibits no discharge.  Cardiovascular: Normal rate, regular rhythm and normal heart sounds.   Pulmonary/Chest: Effort normal and breath sounds normal. She has no wheezes. She has no rales.  Abdominal: Soft. She exhibits distension. There is tenderness (mild, diffuse).  Neurological: She is alert and oriented to person, place, and time.  Skin: Skin is warm and dry.  Nursing note and vitals reviewed.    ED Treatments /  Results  Labs (all labs ordered are listed, but only abnormal results are displayed) Labs Reviewed  COMPREHENSIVE METABOLIC PANEL - Abnormal; Notable for the following:       Result Value   Glucose, Bld 213 (*)    Calcium 8.8 (*)    Albumin 2.9 (*)    Alkaline Phosphatase 131 (*)    Total Bilirubin 4.4 (*)    All other components within normal limits  CBC WITH DIFFERENTIAL/PLATELET - Abnormal; Notable for the following:    RBC 3.77 (*)    Hemoglobin 11.1 (*)    HCT 33.5 (*)    RDW 16.4 (*)    Platelets 56 (*)    All other components within normal limits  LIPASE, BLOOD - Abnormal; Notable for the following:    Lipase 91 (*)    All other components within normal limits  PROTIME-INR - Abnormal; Notable for the following:    Prothrombin Time 19.3 (*)    All other components within normal limits    EKG  EKG Interpretation None       Radiology No results found.  Procedures Procedures (including critical care time)  Medications Ordered in ED Medications - No data to display   Initial Impression / Assessment and Plan / ED Course  I have reviewed the triage vital signs and the nursing notes.  Pertinent labs & imaging results that were available during my care of the patient were reviewed by me and considered in my medical decision making (see chart for details).     Patient does have some abdominal discomfort but she states it is always this way whenever she has ascites.  She is afebrile, normal white blood cell count, and her other labs are at baseline.  I discussed options with patient and at this time I do not have a radiologist for a therapeutic ultrasound paracentesis.  I offered her a diagnostic paracentesis though my suspicion of SBP is pretty low.  She declines and wants to get a paracentesis tomorrow.  I have arranged this through radiology and they are to call her tomorrow morning to help set up the appointment.  Otherwise discuss strict return precautions and  patient verbalized understanding.  Final Clinical Impressions(s) / ED Diagnoses   Final diagnoses:  Ascites due to alcoholic cirrhosis [facility](HCC)    New Prescriptions Discharge Medication List as of 03/22/2017 11:56 PM  Pricilla Loveless, MD 03/23/17 [phone]

## 2017-03-23 ENCOUNTER — Ambulatory Visit (HOSPITAL_COMMUNITY)
Admission: RE | Admit: 2017-03-23 | Discharge: 2017-03-23 | Disposition: A | Discharge status: Home / Self Care | Payer: Medicaid Other | Source: Ambulatory Visit | Attending: Emergency Medicine | Admitting: Emergency Medicine

## 2017-03-23 ENCOUNTER — Encounter (HOSPITAL_COMMUNITY): Payer: Self-pay | Admitting: Student

## 2017-03-23 DIAGNOSIS — K7460 Unspecified cirrhosis of liver: Secondary | ICD-10-CM | POA: Diagnosis not present

## 2017-03-23 DIAGNOSIS — R188 Other ascites: Secondary | ICD-10-CM | POA: Diagnosis not present

## 2017-03-23 HISTORY — PX: IR PARACENTESIS: IMG2679

## 2017-03-23 MED ORDER — LIDOCAINE HCL 2 % IJ SOLN
INTRAMUSCULAR | Status: DC | PRN
Start: 2017-03-23 — End: 2017-03-24
  Administered 2017-03-23: 10 mL

## 2017-03-23 MED ORDER — LIDOCAINE 2% (20 MG/ML) 5 ML SYRINGE
INTRAMUSCULAR | Status: AC
  Filled 2017-03-23: qty 10

## 2017-03-23 NOTE — Procedures (Addendum)
PROCEDURE SUMMARY:  Successful US guided paracentesis from right lateral abdomen.  Yielded 3.0 liters of hazy, yellow fluid.  No immediate complications.  Pt tolerated well.   Specimen was not sent for labs.  Hoyt KochKacie Sue-Ellen Alline Pio PA-C 03/23/2017 11:08 AM

## 2017-04-07 ENCOUNTER — Other Ambulatory Visit: Payer: Self-pay | Admitting: Internal Medicine

## 2017-04-07 DIAGNOSIS — K7031 Alcoholic cirrhosis of liver with ascites: Secondary | ICD-10-CM

## 2017-04-08 ENCOUNTER — Ambulatory Visit (HOSPITAL_COMMUNITY)
Admission: RE | Admit: 2017-04-08 | Discharge: 2017-04-08 | Disposition: A | Discharge status: Home / Self Care | Payer: Medicaid Other | Source: Ambulatory Visit | Attending: Internal Medicine | Admitting: Internal Medicine

## 2017-04-08 ENCOUNTER — Encounter (HOSPITAL_COMMUNITY): Payer: Self-pay | Admitting: Student

## 2017-04-08 DIAGNOSIS — R188 Other ascites: Secondary | ICD-10-CM | POA: Insufficient documentation

## 2017-04-08 DIAGNOSIS — K7031 Alcoholic cirrhosis of liver with ascites: Secondary | ICD-10-CM

## 2017-04-08 HISTORY — PX: IR PARACENTESIS: IMG2679

## 2017-04-08 MED ORDER — LIDOCAINE HCL (PF) 2 % IJ SOLN
INTRAMUSCULAR | Status: DC | PRN
  Administered 2017-04-08: 10 mL

## 2017-04-08 MED ORDER — LIDOCAINE 2% (20 MG/ML) 5 ML SYRINGE
INTRAMUSCULAR | Status: AC
  Filled 2017-04-08: qty 10

## 2017-04-08 MED ORDER — ALBUMIN HUMAN 25 % IV SOLN
25.0000 g | Freq: Once | INTRAVENOUS | Status: AC
  Administered 2017-04-08: 25 g via INTRAVENOUS
  Filled 2017-04-08: qty 100

## 2017-04-08 NOTE — Procedures (Signed)
PROCEDURE SUMMARY:  Successful US guided paracentesis from right lateral abdomen.  Yielded 5.0 liters of yellow fluid.  No immediate complications.  Pt tolerated well.   Specimen was not sent for labs.  Hoyt KochKacie Sue-Ellen Matthews PA-C 04/08/2017 4:08 PM

## 2017-04-28 ENCOUNTER — Encounter: Payer: Self-pay | Admitting: Nurse Practitioner

## 2017-06-29 ENCOUNTER — Emergency Department (HOSPITAL_COMMUNITY): Payer: Medicaid Other

## 2017-06-29 ENCOUNTER — Encounter (HOSPITAL_COMMUNITY): Payer: Self-pay

## 2017-06-29 ENCOUNTER — Emergency Department (HOSPITAL_COMMUNITY)
Admission: EM | Admit: 2017-06-29 | Discharge: 2017-06-29 | Disposition: A | Discharge status: Home / Self Care | Payer: Medicaid Other | Attending: Emergency Medicine | Admitting: Emergency Medicine

## 2017-06-29 DIAGNOSIS — K7460 Unspecified cirrhosis of liver: Secondary | ICD-10-CM | POA: Diagnosis not present

## 2017-06-29 DIAGNOSIS — Z79899 Other long term (current) drug therapy: Secondary | ICD-10-CM | POA: Insufficient documentation

## 2017-06-29 DIAGNOSIS — R140 Abdominal distension (gaseous): Secondary | ICD-10-CM | POA: Diagnosis not present

## 2017-06-29 DIAGNOSIS — F17210 Nicotine dependence, cigarettes, uncomplicated: Secondary | ICD-10-CM | POA: Insufficient documentation

## 2017-06-29 DIAGNOSIS — E119 Type 2 diabetes mellitus without complications: Secondary | ICD-10-CM | POA: Diagnosis not present

## 2017-06-29 DIAGNOSIS — I10 Essential (primary) hypertension: Secondary | ICD-10-CM | POA: Insufficient documentation

## 2017-06-29 DIAGNOSIS — R188 Other ascites: Secondary | ICD-10-CM

## 2017-06-29 DIAGNOSIS — R109 Unspecified abdominal pain: Secondary | ICD-10-CM | POA: Diagnosis present

## 2017-06-29 LAB — BODY FLUID CELL COUNT WITH DIFFERENTIAL
LYMPHS FL: 25 %
Monocyte-Macrophage-Serous Fluid: 69 % (ref 50–90)
Neutrophil Count, Fluid: 6 % (ref 0–25)
Total Nucleated Cell Count, Fluid: 270 cu mm (ref 0–1000)

## 2017-06-29 LAB — CBC WITH DIFFERENTIAL/PLATELET
BASOS ABS: 0 10*3/uL (ref 0.0–0.1)
BASOS PCT: 0 %
EOS PCT: 1 %
Eosinophils Absolute: 0 10*3/uL (ref 0.0–0.7)
HCT: 36.4 % (ref 36.0–46.0)
Hemoglobin: 12.6 g/dL (ref 12.0–15.0)
LYMPHS PCT: 13 %
Lymphs Abs: 0.9 10*3/uL (ref 0.7–4.0)
MCH: 30.1 pg (ref 26.0–34.0)
MCHC: 34.6 g/dL (ref 30.0–36.0)
MCV: 86.9 fL (ref 78.0–100.0)
MONO ABS: 0.6 10*3/uL (ref 0.1–1.0)
Monocytes Relative: 9 %
Neutro Abs: 5.3 10*3/uL (ref 1.7–7.7)
Neutrophils Relative %: 77 %
PLATELETS: 71 10*3/uL — AB (ref 150–400)
RBC: 4.19 MIL/uL (ref 3.87–5.11)
RDW: 18.4 % — AB (ref 11.5–15.5)
WBC: 6.8 10*3/uL (ref 4.0–10.5)

## 2017-06-29 LAB — COMPREHENSIVE METABOLIC PANEL
ALT: 20 U/L (ref 14–54)
AST: 37 U/L (ref 15–41)
Albumin: 3.4 g/dL — ABNORMAL LOW (ref 3.5–5.0)
Alkaline Phosphatase: 118 U/L (ref 38–126)
Anion gap: 10 (ref 5–15)
BUN: 10 mg/dL (ref 6–20)
CALCIUM: 9.4 mg/dL (ref 8.9–10.3)
CHLORIDE: 105 mmol/L (ref 101–111)
CO2: 25 mmol/L (ref 22–32)
Creatinine, Ser: 0.6 mg/dL (ref 0.44–1.00)
Glucose, Bld: 197 mg/dL — ABNORMAL HIGH (ref 65–99)
Potassium: 4.2 mmol/L (ref 3.5–5.1)
SODIUM: 140 mmol/L (ref 135–145)
Total Bilirubin: 6.1 mg/dL — ABNORMAL HIGH (ref 0.3–1.2)
Total Protein: 6.9 g/dL (ref 6.5–8.1)

## 2017-06-29 LAB — PROTIME-INR
INR: 1.91
PROTHROMBIN TIME: 21.7 s — AB (ref 11.4–15.2)

## 2017-06-29 LAB — PATHOLOGIST SMEAR REVIEW

## 2017-06-29 LAB — APTT: APTT: 37 s — AB (ref 24–36)

## 2017-06-29 MED ORDER — LIDOCAINE HCL 1 % IJ SOLN
INTRAMUSCULAR | Status: AC
  Filled 2017-06-29: qty 10

## 2017-06-29 MED ORDER — MORPHINE SULFATE (PF) 4 MG/ML IV SOLN
4.0000 mg | Freq: Once | INTRAVENOUS | Status: AC
  Administered 2017-06-29: 4 mg via INTRAMUSCULAR
  Filled 2017-06-29: qty 1

## 2017-06-29 NOTE — ED Notes (Signed)
Patient to IR for paracentesis.

## 2017-06-29 NOTE — ED Notes (Signed)
Pt given sandwich and water at her request, she is aware we are waiting for cell counts to come back.

## 2017-06-29 NOTE — Procedures (Signed)
Ultrasound-guided diagnostic and therapeutic paracentesis performed yielding 2.7  liters of turbid, amber fluid. No immediate complications. A portion of the fluid was submitted to the lab for preordered studies.

## 2017-06-29 NOTE — ED Notes (Signed)
Patient ambulated to the bathroom with no assistance. 

## 2017-06-29 NOTE — ED Provider Notes (Signed)
[facility]-EMERGENCY DEPT Provider Note   CSN: [id number] Arrival date & time: 06/29/17  0535     History   Chief Complaint Chief Complaint  Patient presents with  . pain  . Cirrhosis    HPI Katrina Cohen is a 52 y.o. female.  HPI   Patient is a 52 year old female with a history of cirrhosis of the liver, bipolar disorder, dysphasia, and hypertension presenting for abdominal pain and distention.  Patient is followed by Dr. Norma Fredrickson of gastroenterology in the [place] [facility] of [place].  Patient reports that this episode is consistent with prior episodes of abdominal pain when she has ascites of the abdomen.  Patient reports that her last therapeutic paracentesis was approximately 3-4 months ago.  Patient reports that the greatest amount ever drained out of her abdomen was 6 L.  Patient denies any history of spontaneous bacterial peritonitis.  Patient denies any recent increase in fluid intake, recent infections, recent use.  Patient has been vomiting approximately 3 times since 12 AM, but denies any fevers, chills, focal abdominal pain, or diarrhea.  Patient reports that she has had head and neck pain with vomiting only, however it resolves in between the episodes of vomiting.  No neck stiffness or rigidity.  Past Medical History:  Diagnosis Date  . Bipolar disorder (HCC) 1980s   with depression, anxiety  . Cirrhosis (HCC)   . Depression with anxiety 1980s   [facility] admission 05/2011.    . Diabetes mellitus   . Dysphagia 11/2014   normal esophagram 11/2014. EGD per Dr Dulce Sellar.   . Hypertension   . Jaundice 04/2015   In setting of infectious mononucleosis. Ultrasound in [place]: hepatosplenomegaly, portal htn, possible cirrhosis  . Liver disease   . Mononucleosis 04/15/2015    Patient Active Problem List   Diagnosis Date Noted  . Encephalopathy, hepatic (HCC)   . Acute encephalopathy 01/18/2017  . Right upper quadrant abdominal pain 04/02/2016  . UTI  (urinary tract infection) 04/02/2016  . Cholelithiasis 04/02/2016  . Depression with anxiety 04/02/2016  . Chronic pain 04/02/2016  . SOB (shortness of breath) 11/12/2015  . COPD exacerbation (HCC) 11/12/2015  . Acute respiratory failure with hypoxia (HCC) 11/12/2015  . CAP (community acquired pneumonia) 11/07/2015  . Cirrhosis (HCC) 11/07/2015  . Thrombocytopenia (HCC) 11/07/2015  . Alcohol abuse 04/30/2015  . Acute hepatitis 04/30/2015  . Tobacco abuse 04/30/2015  . Malnutrition of moderate degree 04/29/2015  . Hypokalemia 04/29/2015  . Anemia 04/29/2015  . Hyponatremia 04/29/2015  . Coagulopathy (HCC) 04/29/2015  . Hypoalbuminemia 04/29/2015  . Mononucleosis 04/28/2015  . Vitamin D deficiency 06/11/2011  . Major depression (HCC) 06/10/2011    Past Surgical History:  Procedure Laterality Date  . APPENDECTOMY    . CESAREAN SECTION    . COLONOSCOPY    . ESOPHAGOGASTRODUODENOSCOPY     Dr Dulce Sellar ? 2016.   Gerald Leitz GENERIC HISTORICAL  04/20/2016   IR PARACENTESIS 04/20/2016 Darrell K Allred, PA-C [facility]-INTERV RAD  . IR PARACENTESIS  03/23/2017  . IR PARACENTESIS  04/08/2017    OB History    No data available       Home Medications    Prior to Admission medications   Medication Sig Start Date End Date Taking? Authorizing Provider  ARIPiprazole (ABILIFY) 5 MG tablet Take 5 mg by mouth at bedtime.  04/02/15  Yes [provider]  citalopram (CELEXA) 20 MG tablet TAKE 1 TABLET BY MOUTH EVERY NIGHT 12/20/16  Yes [provider]  glyBURIDE (DIABETA) 2.5 MG tablet TAKE 1 TABLET BY MOUTH DAILY WITH BREAKFAST 02/12/17  Yes [provider]  Milk Thistle Extract 175 MG TABS Take 1 tablet by mouth daily.    Yes [provider]  morphine (MSIR) 30 MG tablet Take 30 mg by mouth daily. 03/22/17  Yes [provider]  Multiple Vitamins-Minerals (MULTIVITAMIN ADULTS) TABS Take 2 tablets by mouth daily.   Yes [provider]  OVER THE COUNTER  MEDICATION Take 2 capsules by mouth 2 (two) times daily. [place]   Yes [provider]  spironolactone (ALDACTONE) 50 MG tablet Take 100 mg by mouth 2 (two) times daily.    Yes [provider]  XIFAXAN 550 MG TABS tablet Take 1 tablet by mouth 2 (two) times daily. 06/10/17  Yes [provider]  albuterol (PROVENTIL) (2.5 MG/3ML) 0.083% nebulizer solution Take 3 mLs (2.5 mg total) by nebulization every 4 (four) hours as needed for wheezing or shortness of breath. Patient not taking: Reported on 06/29/2017 11/13/15   Standley Brooking, MD  furosemide (LASIX) 20 MG tablet Take 1 tablet (20 mg total) by mouth daily. Patient not taking: Reported on 03/22/2017 01/21/17   Rolly Salter, MD  lactulose (CHRONULAC) 10 GM/15ML solution Take 45 mLs (30 g total) by mouth 3 (three) times daily. Patient not taking: Reported on 06/29/2017 01/21/17   Rolly Salter, MD  LORazepam (ATIVAN) 1 MG tablet TAKE 1 TABLET BY MOUTH AT BEDTIME 12/20/16   [provider]  methocarbamol (ROBAXIN) 500 MG tablet Take 1 tablet (500 mg total) by mouth 2 (two) times daily. Patient not taking: Reported on 06/29/2017 11/09/16   Audry Pili, PA-C  metroNIDAZOLE (METROGEL) 0.75 % vaginal gel Place 1 Applicatorful vaginally 2 (two) times daily. Patient not taking: Reported on 03/22/2017 01/07/17   Dorena Bodo, NP  morphine (KADIAN) 20 MG 24 hr capsule Take 20 mg by mouth 2 (two) times daily.    [provider]  nitrofurantoin, macrocrystal-monohydrate, (MACROBID) 100 MG capsule Take 1 capsule (100 mg total) by mouth 2 (two) times daily. X 7 days Patient not taking: Reported on 03/22/2017 03/06/17   Palumbo, April, MD  ondansetron (ZOFRAN ODT) 4 MG disintegrating tablet Take 1 tablet (4 mg total) by mouth every 8 (eight) hours as needed for nausea or vomiting. Patient not taking: Reported on 06/29/2017 01/07/17   Dorena Bodo, NP  phenazopyridine (PYRIDIUM) 200 MG tablet Take 1 tablet (200 mg  total) by mouth 3 (three) times daily as needed for pain. Patient not taking: Reported on 03/22/2017 01/07/17   Dorena Bodo, NP    Family History Family History  Problem Relation Age of Onset  . Diabetes Mother   . Cancer Father   . Diabetes Sister   . Diabetes Brother     Social History Social History   Tobacco Use  . Smoking status: Current Every Day Smoker    Packs/day: 0.50    Types: Cigarettes  . Smokeless tobacco: Never Used  Substance Use Topics  . Alcohol use: No    Comment: sober for over 1 year  . Drug use: No     Allergies   Tizanidine   Review of Systems Review of Systems  Constitutional: Negative for chills and fever.  HENT: Negative for congestion and rhinorrhea.   Eyes: Negative for visual disturbance.  Respiratory: Negative for chest tightness and shortness of breath.   Cardiovascular: Negative for chest pain.  Gastrointestinal: Positive for abdominal distention, abdominal pain, nausea  and vomiting. Negative for diarrhea.  Genitourinary: Negative for dysuria.  Musculoskeletal: Positive for arthralgias.  Skin: Negative for rash.  Neurological: Positive for headaches.  Psychiatric/Behavioral: Negative for confusion.     Physical Exam Updated Vital Signs BP (!) 143/80   Pulse 79   Temp 98.6 F (37 C) (Oral)   Resp 19   LMP 01/29/2013   SpO2 100%   Physical Exam  Constitutional: She appears well-developed and well-nourished. No distress.  HENT:  Head: Normocephalic and atraumatic.  Mouth/Throat: Oropharynx is clear and moist.  Eyes: Conjunctivae and EOM are normal. Pupils are equal, round, and reactive to light.  Neck: Normal range of motion. Neck supple.  Cardiovascular: Normal rate, regular rhythm, S1 normal and S2 normal.  No murmur heard. Pulmonary/Chest: Effort normal and breath sounds normal. She has no wheezes. She has no rales.  Abdominal: Soft. She exhibits distension. There is tenderness. There is no guarding.  Diffusely  distended abdomen.  Patient is diffusely tender to palpation.  Tenderness to palpation is more pronounced in the right side of the abdomen, which is slightly more extended than left. Abdomen is nontympanitic to percussion.  Musculoskeletal: Normal range of motion. She exhibits no edema or deformity.  Lymphadenopathy:    She has no cervical adenopathy.  Neurological: She is alert.  Cranial nerves grossly intact. Patient moves extremities symmetrically and with good coordination.  Skin: Skin is warm and dry. No rash noted. No erythema.  Psychiatric: She has a normal mood and affect. Her behavior is normal. Judgment and thought content normal.  Nursing note and vitals reviewed.    ED Treatments / Results  Labs (all labs ordered are listed, but only abnormal results are displayed) Labs Reviewed  COMPREHENSIVE METABOLIC PANEL - Abnormal; Notable for the following components:      Result Value   Glucose, Bld 197 (*)    Albumin 3.4 (*)    Total Bilirubin 6.1 (*)    All other components within normal limits  PROTIME-INR - Abnormal; Notable for the following components:   Prothrombin Time 21.7 (*)    All other components within normal limits  CBC WITH DIFFERENTIAL/PLATELET - Abnormal; Notable for the following components:   RDW 18.4 (*)    Platelets 71 (*)    All other components within normal limits  APTT - Abnormal; Notable for the following components:   aPTT 37 (*)    All other components within normal limits    EKG  EKG Interpretation None       Radiology No results found.  Procedures Procedures (including critical care time)  Medications Ordered in ED Medications  morphine 4 MG/ML injection 4 mg (4 mg Intramuscular Given 06/29/17 0735)     Initial Impression / Assessment and Plan / ED Course  I have reviewed the triage vital signs and the nursing notes.  Pertinent labs & imaging results that were available during my care of the patient were reviewed by me and  considered in my medical decision making (see chart for details).      Final Clinical Impressions(s) / ED Diagnoses   Final diagnoses:  Abdominal distension   Patient is nontoxic-appearing, afebrile, and in no acute distress in between episodes of emesis.  Patient exhibiting identical presentation of ascites secondary to cirrhosis, consistent with prior per patient's report.  Feel that patient needs therapeutic paracentesis at this time.  Patient's albumin is 3.4.  Coagulation studies per patient's baseline.  Will consult IR.  8:58 AM Spoke to Dr.  Casimiro Needle of interventional radiology, who reports that physician assistant on his team will complete the therapeutic paracentesis when ultrasound is available.  Patient reassessed after paracentesis, and had complete resolution of abdominal pain and nausea following therapeutic paracentesis.  Paracentesis fluid assessed and demonstrates neutrophils of 6%.  This is within normal limits and not felt to be indicative of infection.  Patient deemed stable for discharge.  Patient given return precautions for any fevers, chills, intractable nausea or vomiting, or abdominal pain.  Patient to follow-up with her gastroenterologist, Dr. Norma Fredrickson, in [place].  Patient is in understanding and agrees with the plan of care.  This is a shared visit with Dr. Derwood Kaplan. Patient was independently evaluated by this attending physician. Attending physician consulted in evaluation and discharge management.  ED Discharge Orders    None       Delia Chimes 06/29/17 1840    Derwood Kaplan, MD 06/30/17 0500

## 2017-06-29 NOTE — Discharge Instructions (Signed)
Please see the information and instructions below regarding your visit.  Your diagnoses today include:  1. Cirrhosis of liver with ascites, unspecified hepatic cirrhosis type (HCC)   2. Abdominal distension    Today interventional radiology drain fluid off of your abdomen.  We are sending this for cell count.  Tests performed today include: See side panel of your discharge paperwork for testing performed today. Vital signs are listed at the bottom of these instructions.   Medications prescribed:    Take any prescribed medications only as prescribed, and any over the counter medications only as directed on the packaging.  Please resume all of your home medications.  Home care instructions:  Please follow any educational materials contained in this packet.   Follow-up instructions: Please follow-up with your primary care provider as soon as possible for further evaluation of your symptoms if they are not completely improved.   Please follow up with Dr. Norma Fredricksonoledo as soon as possible.  Return instructions:  Please return to the Emergency Department if you experience worsening symptoms.  Please return to the emergency department if you develop any fevers, chills, increasing abdominal pain, dizziness, lightheadedness, chest pain, shortness of breath. Please return if you have any other emergent concerns.  Additional Information:   Your vital signs today were: BP (!) 143/66 (BP Location: Right Arm)    Pulse 65    Temp 98.2 F (36.8 C) (Oral)    Resp 18    LMP 01/29/2013    SpO2 98%  If your blood pressure (BP) was elevated on multiple readings during this visit above 130 for the top number or above 80 for the bottom number, please have this repeated by your primary care provider within one month. --------------  Thank you for allowing us to participate in your care today.

## 2017-06-29 NOTE — ED Triage Notes (Signed)
Pt complains of abdominal distention  Pt has cirrhosis of the liver and needs a paracentesis per her Dr who advised her to come tot he ED

## 2017-07-02 LAB — BODY FLUID CULTURE: CULTURE: NO GROWTH

## 2017-07-12 ENCOUNTER — Other Ambulatory Visit: Payer: Self-pay | Admitting: Internal Medicine

## 2017-07-12 DIAGNOSIS — K7031 Alcoholic cirrhosis of liver with ascites: Secondary | ICD-10-CM

## 2017-07-15 ENCOUNTER — Ambulatory Visit (HOSPITAL_COMMUNITY): Payer: Medicaid Other

## 2017-07-18 ENCOUNTER — Encounter (HOSPITAL_COMMUNITY): Payer: Self-pay | Admitting: Radiology

## 2017-07-18 ENCOUNTER — Ambulatory Visit (HOSPITAL_COMMUNITY)
Admission: RE | Admit: 2017-07-18 | Discharge: 2017-07-18 | Disposition: A | Discharge status: Home / Self Care | Payer: Medicaid Other | Source: Ambulatory Visit | Attending: Internal Medicine | Admitting: Internal Medicine

## 2017-07-18 DIAGNOSIS — R188 Other ascites: Secondary | ICD-10-CM | POA: Insufficient documentation

## 2017-07-18 DIAGNOSIS — K7031 Alcoholic cirrhosis of liver with ascites: Secondary | ICD-10-CM

## 2017-07-18 HISTORY — PX: IR PARACENTESIS: IMG2679

## 2017-07-18 MED ORDER — LIDOCAINE 2% (20 MG/ML) 5 ML SYRINGE
INTRAMUSCULAR | Status: AC
  Filled 2017-07-18: qty 10

## 2017-07-18 MED ORDER — LIDOCAINE HCL (PF) 1 % IJ SOLN
INTRAMUSCULAR | Status: DC | PRN
  Administered 2017-07-18: 10 mL

## 2017-07-18 NOTE — Procedures (Signed)
PROCEDURE SUMMARY:  Successful US guided paracentesis from LLQ.  Yielded 3 L of clear yellow fluid.  No immediate complications.  Pt tolerated well.   Specimen was not sent for labs.  Brayton ElBRUNING, Marigny Borre PA-C 07/18/2017 2:58 PM

## 2017-08-10 ENCOUNTER — Ambulatory Visit: Admission: RE | Admit: 2017-08-10 | Payer: Medicaid Other | Source: Ambulatory Visit | Admitting: Internal Medicine

## 2017-08-10 ENCOUNTER — Encounter: Admission: RE | Payer: Self-pay | Source: Ambulatory Visit

## 2017-08-10 ENCOUNTER — Encounter: Payer: Self-pay | Admitting: Certified Registered"

## 2017-08-10 SURGERY — COLONOSCOPY WITH PROPOFOL
Anesthesia: General

## 2017-09-05 ENCOUNTER — Other Ambulatory Visit: Payer: Self-pay | Admitting: Internal Medicine

## 2017-09-05 DIAGNOSIS — K7031 Alcoholic cirrhosis of liver with ascites: Secondary | ICD-10-CM

## 2017-09-07 ENCOUNTER — Encounter (HOSPITAL_COMMUNITY): Payer: Self-pay | Admitting: Student

## 2017-09-07 ENCOUNTER — Ambulatory Visit (HOSPITAL_COMMUNITY)
Admission: RE | Admit: 2017-09-07 | Discharge: 2017-09-07 | Disposition: A | Discharge status: Home / Self Care | Payer: Medicaid Other | Source: Ambulatory Visit | Attending: Internal Medicine | Admitting: Internal Medicine

## 2017-09-07 DIAGNOSIS — R188 Other ascites: Secondary | ICD-10-CM | POA: Insufficient documentation

## 2017-09-07 DIAGNOSIS — K7031 Alcoholic cirrhosis of liver with ascites: Secondary | ICD-10-CM | POA: Diagnosis present

## 2017-09-07 HISTORY — PX: IR PARACENTESIS: IMG2679

## 2017-09-07 LAB — BODY FLUID CELL COUNT WITH DIFFERENTIAL
EOS FL: 0 %
LYMPHS FL: 35 %
Monocyte-Macrophage-Serous Fluid: 63 % (ref 50–90)
NEUTROPHIL FLUID: 2 % (ref 0–25)
WBC FLUID: 261 uL (ref 0–1000)

## 2017-09-07 MED ORDER — LIDOCAINE HCL 1 % IJ SOLN
INTRAMUSCULAR | Status: DC | PRN
  Administered 2017-09-07: 10 mL

## 2017-09-07 MED ORDER — LIDOCAINE HCL (PF) 2 % IJ SOLN
INTRAMUSCULAR | Status: AC
  Filled 2017-09-07: qty 20

## 2017-09-07 NOTE — Procedures (Signed)
PROCEDURE SUMMARY:  Successful US guided paracentesis from left lateral abdomen.  Yielded 5.2 liters of pink, cloudy fluid.  No immediate complications.  Pt tolerated well.   Specimen was sent for labs.  Hoyt KochKacie Sue-Ellen Matthews PA-C 09/07/2017 11:21 AM

## 2017-09-08 LAB — PATHOLOGIST SMEAR REVIEW

## 2017-09-12 ENCOUNTER — Other Ambulatory Visit: Payer: Self-pay | Admitting: Internal Medicine

## 2017-09-12 DIAGNOSIS — K7031 Alcoholic cirrhosis of liver with ascites: Secondary | ICD-10-CM

## 2017-09-14 ENCOUNTER — Encounter (HOSPITAL_COMMUNITY): Payer: Self-pay | Admitting: Radiology

## 2017-09-14 ENCOUNTER — Ambulatory Visit (HOSPITAL_COMMUNITY)
Admission: RE | Admit: 2017-09-14 | Discharge: 2017-09-14 | Disposition: A | Discharge status: Home / Self Care | Payer: Medicaid Other | Source: Ambulatory Visit | Attending: Internal Medicine | Admitting: Internal Medicine

## 2017-09-14 DIAGNOSIS — K7031 Alcoholic cirrhosis of liver with ascites: Secondary | ICD-10-CM | POA: Diagnosis present

## 2017-09-14 HISTORY — PX: IR PARACENTESIS: IMG2679

## 2017-09-14 MED ORDER — LIDOCAINE HCL (PF) 2 % IJ SOLN
INTRAMUSCULAR | Status: AC
  Filled 2017-09-14: qty 20

## 2017-09-14 MED ORDER — ALBUMIN HUMAN 25 % IV SOLN
25.0000 g | Freq: Once | INTRAVENOUS | Status: AC
  Administered 2017-09-14: 25 g via INTRAVENOUS
  Filled 2017-09-14: qty 100

## 2017-09-14 MED ORDER — LIDOCAINE HCL (PF) 1 % IJ SOLN
INTRAMUSCULAR | Status: DC | PRN
  Administered 2017-09-14: 10 mL

## 2017-09-14 NOTE — Procedures (Signed)
   US guided LLQ paracentesis  5 L maximum cloudy pink fluid  Tolerated well  cyto per MD

## 2017-09-22 ENCOUNTER — Other Ambulatory Visit: Payer: Self-pay | Admitting: Internal Medicine

## 2017-09-22 DIAGNOSIS — K7031 Alcoholic cirrhosis of liver with ascites: Secondary | ICD-10-CM

## 2017-09-23 ENCOUNTER — Ambulatory Visit (HOSPITAL_COMMUNITY)
Admission: RE | Admit: 2017-09-23 | Discharge: 2017-09-23 | Disposition: A | Discharge status: Home / Self Care | Payer: Medicaid Other | Source: Ambulatory Visit | Attending: Internal Medicine | Admitting: Internal Medicine

## 2017-09-23 ENCOUNTER — Encounter (HOSPITAL_COMMUNITY): Payer: Self-pay | Admitting: Student

## 2017-09-23 DIAGNOSIS — K7031 Alcoholic cirrhosis of liver with ascites: Secondary | ICD-10-CM

## 2017-09-23 DIAGNOSIS — R188 Other ascites: Secondary | ICD-10-CM | POA: Insufficient documentation

## 2017-09-23 HISTORY — PX: IR PARACENTESIS: IMG2679

## 2017-09-23 LAB — BODY FLUID CELL COUNT WITH DIFFERENTIAL
Eos, Fluid: 0 %
LYMPHS FL: 70 %
MONOCYTE-MACROPHAGE-SEROUS FLUID: 29 % — AB (ref 50–90)
Neutrophil Count, Fluid: 1 % (ref 0–25)
WBC FLUID: 128 uL (ref 0–1000)

## 2017-09-23 LAB — ALBUMIN, PLEURAL OR PERITONEAL FLUID

## 2017-09-23 MED ORDER — LIDOCAINE HCL (PF) 2 % IJ SOLN
INTRAMUSCULAR | Status: DC | PRN
  Administered 2017-09-23: 10 mL

## 2017-09-23 MED ORDER — LIDOCAINE HCL (PF) 2 % IJ SOLN
INTRAMUSCULAR | Status: AC
  Filled 2017-09-23: qty 20

## 2017-09-23 NOTE — Procedures (Signed)
PROCEDURE SUMMARY:  Successful US guided paracentesis from right lateral abdomen.  Yielded 3.7 liters of blood-tinged, cloudy fluid.  No immediate complications.  Pt tolerated well.   Specimen was sent for labs.  Hoyt Koch PA-C 09/23/2017 4:32 PM

## 2017-09-26 LAB — BODY FLUID CULTURE: Culture: NO GROWTH

## 2017-09-27 ENCOUNTER — Ambulatory Visit (HOSPITAL_COMMUNITY)
Admission: RE | Admit: 2017-09-27 | Discharge: 2017-09-27 | Disposition: A | Discharge status: Home / Self Care | Payer: Medicaid Other | Source: Ambulatory Visit | Attending: Internal Medicine | Admitting: Internal Medicine

## 2017-09-27 DIAGNOSIS — K7031 Alcoholic cirrhosis of liver with ascites: Secondary | ICD-10-CM | POA: Insufficient documentation

## 2017-10-03 ENCOUNTER — Other Ambulatory Visit: Payer: Self-pay | Admitting: Internal Medicine

## 2017-10-03 DIAGNOSIS — R188 Other ascites: Secondary | ICD-10-CM

## 2017-10-04 ENCOUNTER — Encounter: Payer: Self-pay | Admitting: *Deleted

## 2017-10-05 ENCOUNTER — Encounter: Admission: RE | Payer: Self-pay | Source: Ambulatory Visit

## 2017-10-05 ENCOUNTER — Ambulatory Visit: Admission: RE | Admit: 2017-10-05 | Payer: Medicaid Other | Source: Ambulatory Visit | Admitting: Internal Medicine

## 2017-10-05 HISTORY — DX: Inflammatory liver disease, unspecified: K75.9

## 2017-10-05 SURGERY — COLONOSCOPY WITH PROPOFOL
Anesthesia: General

## 2017-10-06 ENCOUNTER — Ambulatory Visit (HOSPITAL_COMMUNITY)
Admission: RE | Admit: 2017-10-06 | Discharge: 2017-10-06 | Disposition: A | Discharge status: Home / Self Care | Payer: Medicaid Other | Source: Ambulatory Visit | Attending: Internal Medicine | Admitting: Internal Medicine

## 2017-10-06 ENCOUNTER — Encounter (HOSPITAL_COMMUNITY): Payer: Self-pay | Admitting: Radiology

## 2017-10-06 DIAGNOSIS — R188 Other ascites: Secondary | ICD-10-CM | POA: Diagnosis not present

## 2017-10-06 HISTORY — PX: IR PARACENTESIS: IMG2679

## 2017-10-06 LAB — BODY FLUID CELL COUNT WITH DIFFERENTIAL
Eos, Fluid: NONE SEEN %
Lymphs, Fluid: 24 %
Monocyte-Macrophage-Serous Fluid: 74 % (ref 50–90)
NEUTROPHIL FLUID: 2 % (ref 0–25)
Total Nucleated Cell Count, Fluid: 122 cu mm (ref 0–1000)

## 2017-10-06 LAB — BASIC METABOLIC PANEL
Anion gap: 9 (ref 5–15)
BUN: 11 mg/dL (ref 6–20)
CO2: 25 mmol/L (ref 22–32)
CREATININE: 0.91 mg/dL (ref 0.44–1.00)
Calcium: 9 mg/dL (ref 8.9–10.3)
Chloride: 93 mmol/L — ABNORMAL LOW (ref 101–111)
GFR calc Af Amer: >60 mL/min (ref >60)
GFR calc non Af Amer: >60 mL/min (ref >60)
Glucose, Bld: 149 mg/dL — ABNORMAL HIGH (ref 65–99)
Potassium: 4.8 mmol/L (ref 3.5–5.1)
SODIUM: 127 mmol/L — AB (ref 135–145)

## 2017-10-06 LAB — SODIUM: Sodium: 129 mmol/L — ABNORMAL LOW (ref 135–145)

## 2017-10-06 LAB — ALBUMIN, PLEURAL OR PERITONEAL FLUID

## 2017-10-06 LAB — ALBUMIN: ALBUMIN: 3.1 g/dL — AB (ref 3.5–5.0)

## 2017-10-06 MED ORDER — LIDOCAINE HCL (PF) 2 % IJ SOLN
INTRAMUSCULAR | Status: AC
  Filled 2017-10-06: qty 10

## 2017-10-06 MED ORDER — ALBUMIN HUMAN 25 % IV SOLN
25.0000 g | Freq: Once | INTRAVENOUS | Status: AC
  Administered 2017-10-06: 25 g via INTRAVENOUS
  Filled 2017-10-06: qty 100

## 2017-10-06 MED ORDER — LIDOCAINE HCL (PF) 1 % IJ SOLN
INTRAMUSCULAR | Status: AC | PRN
  Administered 2017-10-06: 10 mL

## 2017-10-06 NOTE — Procedures (Signed)
PROCEDURE SUMMARY:  Successful US guided paracentesis from LLQ.  Yielded 6 L of hazy, amber fluid.  No immediate complications.  Pt tolerated well.   Specimen was sent for labs.  Brayton El PA-C 10/06/2017 11:58 AM

## 2017-10-09 LAB — BODY FLUID CULTURE: CULTURE: NO GROWTH

## 2017-10-11 ENCOUNTER — Other Ambulatory Visit: Payer: Self-pay | Admitting: Internal Medicine

## 2017-10-11 DIAGNOSIS — K7031 Alcoholic cirrhosis of liver with ascites: Secondary | ICD-10-CM

## 2017-10-14 ENCOUNTER — Ambulatory Visit (HOSPITAL_COMMUNITY)
Admission: RE | Admit: 2017-10-14 | Discharge: 2017-10-14 | Disposition: A | Discharge status: Home / Self Care | Payer: Medicaid Other | Source: Ambulatory Visit | Attending: Internal Medicine | Admitting: Internal Medicine

## 2017-10-14 ENCOUNTER — Encounter (HOSPITAL_COMMUNITY): Payer: Self-pay

## 2017-10-14 ENCOUNTER — Encounter: Payer: Self-pay | Admitting: *Deleted

## 2017-10-14 DIAGNOSIS — K759 Inflammatory liver disease, unspecified: Secondary | ICD-10-CM | POA: Insufficient documentation

## 2017-10-14 DIAGNOSIS — R188 Other ascites: Secondary | ICD-10-CM | POA: Diagnosis not present

## 2017-10-14 DIAGNOSIS — K7460 Unspecified cirrhosis of liver: Secondary | ICD-10-CM | POA: Insufficient documentation

## 2017-10-14 DIAGNOSIS — K7031 Alcoholic cirrhosis of liver with ascites: Secondary | ICD-10-CM

## 2017-10-14 HISTORY — PX: IR PARACENTESIS: IMG2679

## 2017-10-14 LAB — BODY FLUID CELL COUNT WITH DIFFERENTIAL
Eos, Fluid: 0 %
LYMPHS FL: 53 %
MONOCYTE-MACROPHAGE-SEROUS FLUID: 43 % — AB (ref 50–90)
NEUTROPHIL FLUID: 4 % (ref 0–25)
Total Nucleated Cell Count, Fluid: 46 cu mm (ref 0–1000)

## 2017-10-14 LAB — GRAM STAIN

## 2017-10-14 LAB — ALBUMIN, PLEURAL OR PERITONEAL FLUID

## 2017-10-14 MED ORDER — ALBUMIN HUMAN 25 % IV SOLN
25.0000 g | Freq: Once | INTRAVENOUS | Status: DC
  Filled 2017-10-14 (×2): qty 100

## 2017-10-14 MED ORDER — LIDOCAINE HCL (PF) 2 % IJ SOLN
INTRAMUSCULAR | Status: AC
  Filled 2017-10-14: qty 20

## 2017-10-14 NOTE — Procedures (Signed)
PROCEDURE SUMMARY:  Successful US guided paracentesis from LLQ.  Yielded 5.2 L of hazy, amber colored fluid.  No immediate complications.  Pt tolerated well.   Specimen was sent for labs.  Brayton El PA-C 10/14/2017 10:51 AM

## 2017-10-15 ENCOUNTER — Other Ambulatory Visit: Payer: Self-pay

## 2017-10-15 ENCOUNTER — Emergency Department (HOSPITAL_COMMUNITY)
Admission: EM | Admit: 2017-10-15 | Discharge: 2017-10-15 | Disposition: A | Discharge status: Home / Self Care | Payer: Medicaid Other | Attending: Emergency Medicine | Admitting: Emergency Medicine

## 2017-10-15 ENCOUNTER — Encounter (HOSPITAL_COMMUNITY): Payer: Self-pay | Admitting: Emergency Medicine

## 2017-10-15 ENCOUNTER — Emergency Department (HOSPITAL_COMMUNITY)
Admission: EM | Admit: 2017-10-15 | Discharge: 2017-10-16 | Disposition: A | Discharge status: Home / Self Care | Payer: Medicaid Other | Source: Home / Self Care | Attending: Emergency Medicine | Admitting: Emergency Medicine

## 2017-10-15 DIAGNOSIS — J449 Chronic obstructive pulmonary disease, unspecified: Secondary | ICD-10-CM | POA: Diagnosis not present

## 2017-10-15 DIAGNOSIS — F17210 Nicotine dependence, cigarettes, uncomplicated: Secondary | ICD-10-CM | POA: Diagnosis not present

## 2017-10-15 DIAGNOSIS — Z9889 Other specified postprocedural states: Secondary | ICD-10-CM | POA: Diagnosis not present

## 2017-10-15 DIAGNOSIS — Z79899 Other long term (current) drug therapy: Secondary | ICD-10-CM | POA: Diagnosis not present

## 2017-10-15 DIAGNOSIS — R5383 Other fatigue: Secondary | ICD-10-CM | POA: Diagnosis not present

## 2017-10-15 DIAGNOSIS — I10 Essential (primary) hypertension: Secondary | ICD-10-CM | POA: Insufficient documentation

## 2017-10-15 NOTE — Discharge Instructions (Signed)
Try to leave the pressure dressing on for a while longer to try to get the wound to seal off. Can change bandages with supplies we have given you. Return here for any new/acute changes-- high fever, bleeding from site, etc.

## 2017-10-15 NOTE — ED Notes (Signed)
Dressing saturated in triage.  Removed and replaced with 4X4's and abd pad.

## 2017-10-15 NOTE — ED Notes (Signed)
Dressed wound site in pressure dressing utilizing quikclot gauze under an abd pad, secured with tape and large ace wrap.

## 2017-10-15 NOTE — ED Triage Notes (Signed)
Pt presents with continued "leaking" from paracentesis site, pt had procedure yesterday at this facility. Pt reports having never having this issue in the past. Pt states she is saturating gauze every 10 minutes, shirt and shorts noted to have leakage on them. Denies pain, only normal soreness.

## 2017-10-15 NOTE — ED Notes (Signed)
ED Provider at bedside. 

## 2017-10-15 NOTE — ED Provider Notes (Signed)
Katrina Cohen [facility] EMERGENCY DEPARTMENT Provider Note   CSN: [id number] Arrival date & time: 10/15/17  0219     History   Chief Complaint Chief Complaint  Patient presents with  . Post-op Problem    HPI Josephyne Tarter is a 52 y.o. female.  The history is provided by the patient and medical records.     52 y.o. F with hx of bipolar disorder, cirrhosis w/chronic jaundice, depression, DM, hepatitis, HTN, presenting to the ED for leaking from paracentesis site.  This was performed yesterday in this facility with IR, 5.7 L taken off  Procedure went well, no noted complications.  States once returning home she noticed some continued oozing of fluid from puncture site.  At first there was a tiny but of blood mixed in, fluid is now yellow/clear.  She denies any pain.  No fever/chills.  State she has been soaking through bandages throughout the night.  States she called and wast old "it would take some time to seal up" but she is concerned that it isn't happening.  Past Medical History:  Diagnosis Date  . Bipolar 2 disorder (HCC)   . Bipolar disorder (HCC) 1980s   with depression, anxiety  . Cirrhosis (HCC)   . Depression   . Depression with anxiety 1980s   [facility] admission 05/2011.    . Diabetes mellitus   . Dysphagia 11/2014   normal esophagram 11/2014. EGD per Dr Dulce Sellar.   . Hepatitis   . Hypertension   . Jaundice 04/2015   In setting of infectious mononucleosis. Ultrasound in [place]: hepatosplenomegaly, portal htn, possible cirrhosis  . Liver disease   . Mononucleosis 04/15/2015    Patient Active Problem List   Diagnosis Date Noted  . Encephalopathy, hepatic (HCC)   . Acute encephalopathy 01/18/2017  . Right upper quadrant abdominal pain 04/02/2016  . UTI (urinary tract infection) 04/02/2016  . Cholelithiasis 04/02/2016  . Depression with anxiety 04/02/2016  . Chronic pain 04/02/2016  . SOB (shortness of breath) 11/12/2015  . COPD exacerbation (HCC)  11/12/2015  . Acute respiratory failure with hypoxia (HCC) 11/12/2015  . CAP (community acquired pneumonia) 11/07/2015  . Cirrhosis (HCC) 11/07/2015  . Thrombocytopenia (HCC) 11/07/2015  . Alcohol abuse 04/30/2015  . Acute hepatitis 04/30/2015  . Tobacco abuse 04/30/2015  . Malnutrition of moderate degree 04/29/2015  . Hypokalemia 04/29/2015  . Anemia 04/29/2015  . Hyponatremia 04/29/2015  . Coagulopathy (HCC) 04/29/2015  . Hypoalbuminemia 04/29/2015  . Mononucleosis 04/28/2015  . Vitamin D deficiency 06/11/2011  . Major depression (HCC) 06/10/2011    Past Surgical History:  Procedure Laterality Date  . APPENDECTOMY    . CESAREAN SECTION    . COLONOSCOPY    . ESOPHAGOGASTRODUODENOSCOPY     Dr Dulce Sellar ? 2016.   Gerald Leitz GENERIC HISTORICAL  04/20/2016   IR PARACENTESIS 04/20/2016 Darrell K Allred, PA-C [facility]INTERV RAD  . IR PARACENTESIS  03/23/2017  . IR PARACENTESIS  04/08/2017  . IR PARACENTESIS  07/18/2017  . IR PARACENTESIS  09/07/2017  . IR PARACENTESIS  09/14/2017  . IR PARACENTESIS  09/23/2017  . IR PARACENTESIS  10/06/2017  . IR PARACENTESIS  10/14/2017     OB History   None      Home Medications    Prior to Admission medications   Medication Sig Start Date End Date Taking? Authorizing Provider  albuterol (PROVENTIL) (2.5 MG/3ML) 0.083% nebulizer solution Take 3 mLs (2.5 mg total) by nebulization every 4 (four) hours as needed for wheezing  or shortness of breath. Patient not taking: Reported on 06/29/2017 11/13/15   Standley Brooking, MD  ARIPiprazole (ABILIFY) 5 MG tablet Take 5 mg by mouth at bedtime.  04/02/15   [provider]  citalopram (CELEXA) 20 MG tablet TAKE 1 TABLET BY MOUTH EVERY NIGHT 12/20/16   [provider]  furosemide (LASIX) 40 MG tablet Take 40 mg by mouth daily.    [provider]  glyBURIDE (DIABETA) 2.5 MG tablet TAKE 1 TABLET BY MOUTH DAILY WITH BREAKFAST 02/12/17   [provider]  LORazepam (ATIVAN) 1 MG tablet  TAKE 1 TABLET BY MOUTH AT BEDTIME 12/20/16   [provider]  methocarbamol (ROBAXIN) 500 MG tablet Take 1 tablet (500 mg total) by mouth 2 (two) times daily. Patient not taking: Reported on 06/29/2017 11/09/16   Audry Pili, PA-C  metroNIDAZOLE (METROGEL) 0.75 % vaginal gel Place 1 Applicatorful vaginally 2 (two) times daily. Patient not taking: Reported on 03/22/2017 01/07/17   Dorena Bodo, NP  Milk Thistle Extract 175 MG TABS Take 1 tablet by mouth daily.     [provider]  morphine (KADIAN) 20 MG 24 hr capsule Take 20 mg by mouth 2 (two) times daily.    [provider]  morphine (MSIR) 30 MG tablet Take 30 mg by mouth daily. 03/22/17   [provider]  Multiple Vitamins-Minerals (MULTIVITAMIN ADULTS) TABS Take 2 tablets by mouth daily.    [provider]  Nettle, Urtica Dioica, (NETTLE LEAF PO) Take 1 capsule by mouth 2 (two) times daily.    [provider]  ondansetron (ZOFRAN ODT) 4 MG disintegrating tablet Take 1 tablet (4 mg total) by mouth every 8 (eight) hours as needed for nausea or vomiting. Patient not taking: Reported on 06/29/2017 01/07/17   Dorena Bodo, NP  OVER THE COUNTER MEDICATION Take 2 capsules by mouth 2 (two) times daily. Nettles    [provider]  oxyCODONE (ROXICODONE) 15 MG immediate release tablet Take 15 mg by mouth 3 (three) times daily as needed for pain.    [provider]  phenazopyridine (PYRIDIUM) 200 MG tablet Take 100 mg by mouth 3 (three) times daily as needed for pain.    [provider]  pioglitazone (ACTOS) 30 MG tablet Take 30 mg by mouth daily.    [provider]  promethazine (PHENERGAN) 25 MG tablet Take 25 mg by mouth every 6 (six) hours as needed for nausea or vomiting.    [provider]  spironolactone (ALDACTONE) 50 MG tablet Take 100 mg by mouth 2 (two) times daily.     [provider]  traZODone (DESYREL) 50 MG tablet Take 50 mg by  mouth at bedtime.    [provider]  Vitamin D, Ergocalciferol, (DRISDOL) 50000 units CAPS capsule Take 50,000 Units by mouth 2 (two) times a week.    [provider]  XIFAXAN 550 MG TABS tablet Take 1 tablet by mouth 2 (two) times daily. 06/10/17   [provider]    Family History Family History  Problem Relation Age of Onset  . Diabetes Mother   . Cancer Father   . Diabetes Sister   . Diabetes Brother     Social History Social History   Tobacco Use  . Smoking status: Current Every Day Smoker    Packs/day: 0.50    Types: Cigarettes  . Smokeless tobacco: Never Used  Substance Use Topics  . Alcohol use: Not Currently    Comment: sober for  over 1 year  . Drug use: No     Allergies   Tizanidine   Review of Systems Review of Systems  Skin: Positive for wound.  All other systems reviewed and are negative.    Physical Exam Updated Vital Signs BP (!) 126/49 (BP Location: Right Arm)   Pulse 71   Temp 99 F (37.2 C) (Oral)   Resp 16   Wt 67.6 kg (149 lb)   LMP 01/29/2013 Comment: Pregnacy test:  SpO2 100%   BMI 25.58 kg/m   Physical Exam  Constitutional: She is oriented to person, place, and time. She appears well-developed and well-nourished.  HENT:  Head: Normocephalic and atraumatic.  Mouth/Throat: Oropharynx is clear and moist.  Eyes: Pupils are equal, round, and reactive to light. Conjunctivae and EOM are normal. Scleral icterus (mild) is present.  Neck: Normal range of motion.  Cardiovascular: Normal rate, regular rhythm and normal heart sounds.  Pulmonary/Chest: Effort normal and breath sounds normal. No stridor. No respiratory distress.  Abdominal: Soft. Bowel sounds are normal. She exhibits distension. There is no tenderness. There is no rebound.  Distended with some mild ascites, puncture wound to left lower abdomen from site of paracentesis appears clean, no surrounding erythema, induration, or signs of cellulitis,  clear/yellow fluid slowly oozing from wound  Musculoskeletal: Normal range of motion.  Neurological: She is alert and oriented to person, place, and time.  Skin: Skin is warm and dry.  Psychiatric: She has a normal mood and affect.  Nursing note and vitals reviewed.    ED Treatments / Results  Labs (all labs ordered are listed, but only abnormal results are displayed) Labs Reviewed - No data to display  EKG None  Radiology Ir Paracentesis  Result Date: 10/14/2017 INDICATION: History of hepatitis and cirrhosis. Recurrent ascites. Request for diagnostic and therapeutic paracentesis. EXAM: ULTRASOUND GUIDED LEFT LOWER QUADRANT PARACENTESIS MEDICATIONS: None. COMPLICATIONS: None immediate. PROCEDURE: Informed written consent was obtained from the patient after a discussion of the risks, benefits and alternatives to treatment. A timeout was performed prior to the initiation of the procedure. Initial ultrasound scanning demonstrates a large amount of ascites within the left lower abdominal quadrant. The left lower abdomen was prepped and draped in the usual sterile fashion. 1% lidocaine with epinephrine was used for local anesthesia. Following this, a 19 gauge, 7-cm, Yueh catheter was introduced. An ultrasound image was saved for documentation purposes. The paracentesis was performed. The catheter was removed and a dressing was applied. The patient tolerated the procedure well without immediate post procedural complication. FINDINGS: A total of approximately 5.2 L of hazy, amber colored fluid was removed. Samples were sent to the laboratory as requested by the clinical team. IMPRESSION: Successful ultrasound-guided paracentesis yielding 5.2 liters of peritoneal fluid. Read by: Brayton El PA-C Electronically Signed   By: Malachy Moan M.D.   On: 10/14/2017 10:53    Procedures Procedures (including critical care time)  Medications Ordered in ED Medications - No data to display   Initial  Impression / Assessment and Plan / ED Course  I have reviewed the triage vital signs and the nursing notes.  Pertinent labs & imaging results that were available during my care of the patient were reviewed by me and considered in my medical decision making (see chart for details).  52 y.o. F here due to fluid oozing from site of her paracentesis yesterday.  Had about 6L drawn off, no complications.  Fluid analysis appears clear on chart review.  No fever/chills.  Denies abdominal pain.  States she called and was told the site may not have completely sealed off yet but is concerned due to continued oozing.  On exam, site appears clean without any erythema, redness, induration, or purulent drainage.  She does have very slow ooze of clear/yellow fluid from the puncture site.  Will place pressure dressing and monitor.  5:42 AM Patient has been observed here, no longer oozing from the wound.  Dressing remains dry and intact here.  Will d/c home.  Given extra supplies to change dressings at home.  Follow-up closely with PCP.  She understands to return here for any new/acute changes-- redness of abdomen, abdominal pain, fever, bleeding from site, etc.  Final Clinical Impressions(s) / ED Diagnoses   Final diagnoses:  Status post abdominal paracentesis    ED Discharge Orders    None       Garlon Hatchet, PA-C 10/15/17 [id number]    Palumbo, April, MD 10/15/17 [id number]

## 2017-10-15 NOTE — ED Triage Notes (Signed)
Reports having paracentesis on Friday and having leakage from the site.  Was seen Friday for the same.  Shirt noted to be wet from leakage.

## 2017-10-16 ENCOUNTER — Encounter (HOSPITAL_COMMUNITY): Payer: Self-pay

## 2017-10-16 ENCOUNTER — Emergency Department (HOSPITAL_COMMUNITY)
Admission: EM | Admit: 2017-10-16 | Discharge: 2017-10-16 | Disposition: A | Discharge status: Home / Self Care | Payer: Medicaid Other | Source: Home / Self Care | Attending: Emergency Medicine | Admitting: Emergency Medicine

## 2017-10-16 DIAGNOSIS — Z5189 Encounter for other specified aftercare: Secondary | ICD-10-CM

## 2017-10-16 LAB — BASIC METABOLIC PANEL
Anion gap: 9 (ref 5–15)
BUN: 13 mg/dL (ref 6–20)
CALCIUM: 9 mg/dL (ref 8.9–10.3)
CO2: 27 mmol/L (ref 22–32)
Chloride: 94 mmol/L — ABNORMAL LOW (ref 101–111)
Creatinine, Ser: 1 mg/dL (ref 0.44–1.00)
GFR calc Af Amer: >60 mL/min (ref >60)
Glucose, Bld: 151 mg/dL — ABNORMAL HIGH (ref 65–99)
POTASSIUM: 4.1 mmol/L (ref 3.5–5.1)
Sodium: 130 mmol/L — ABNORMAL LOW (ref 135–145)

## 2017-10-16 LAB — CBC
HEMATOCRIT: 36.9 % (ref 36.0–46.0)
HEMOGLOBIN: 12.3 g/dL (ref 12.0–15.0)
MCH: 29.9 pg (ref 26.0–34.0)
MCHC: 33.3 g/dL (ref 30.0–36.0)
MCV: 89.8 fL (ref 78.0–100.0)
Platelets: 61 10*3/uL — ABNORMAL LOW (ref 150–400)
RBC: 4.11 MIL/uL (ref 3.87–5.11)
RDW: 17.1 % — AB (ref 11.5–15.5)
WBC: 3.5 10*3/uL — ABNORMAL LOW (ref 4.0–10.5)

## 2017-10-16 MED ORDER — LIDOCAINE HCL (PF) 1 % IJ SOLN
30.0000 mL | Freq: Once | INTRAMUSCULAR | Status: AC
  Administered 2017-10-16: 30 mL
  Filled 2017-10-16: qty 30

## 2017-10-16 NOTE — ED Provider Notes (Signed)
[facility] EMERGENCY DEPARTMENT Provider Note   CSN: [id number] Arrival date & time: 10/15/17  2214     History   Chief Complaint Chief Complaint  Patient presents with  . leaking from paracentesis site    HPI Katrina Cohen is a 52 y.o. female.  The history is provided by the patient.  Illness  This is a recurrent problem. The problem has been gradually worsening. Nothing aggravates the symptoms. Nothing relieves the symptoms.   Patient with history of cirrhosis who recently underwent a paracentesis.  Since that time she has had leaking from the wound.  She was seen on previous ER visit for this, it improved at that time.  However it began to leak again tonight She has no other new complaints Past Medical History:  Diagnosis Date  . Bipolar 2 disorder (HCC)   . Bipolar disorder (HCC) 1980s   with depression, anxiety  . Cirrhosis (HCC)   . Depression   . Depression with anxiety 1980s   [facility] admission 05/2011.    . Diabetes mellitus   . Dysphagia 11/2014   normal esophagram 11/2014. EGD per Dr Dulce Sellar.   . Hepatitis   . Hypertension   . Jaundice 04/2015   In setting of infectious mononucleosis. Ultrasound in [place]: hepatosplenomegaly, portal htn, possible cirrhosis  . Liver disease   . Mononucleosis 04/15/2015    Patient Active Problem List   Diagnosis Date Noted  . Encephalopathy, hepatic (HCC)   . Acute encephalopathy 01/18/2017  . Right upper quadrant abdominal pain 04/02/2016  . UTI (urinary tract infection) 04/02/2016  . Cholelithiasis 04/02/2016  . Depression with anxiety 04/02/2016  . Chronic pain 04/02/2016  . SOB (shortness of breath) 11/12/2015  . COPD exacerbation (HCC) 11/12/2015  . Acute respiratory failure with hypoxia (HCC) 11/12/2015  . CAP (community acquired pneumonia) 11/07/2015  . Cirrhosis (HCC) 11/07/2015  . Thrombocytopenia (HCC) 11/07/2015  . Alcohol abuse 04/30/2015  . Acute hepatitis 04/30/2015  . Tobacco  abuse 04/30/2015  . Malnutrition of moderate degree 04/29/2015  . Hypokalemia 04/29/2015  . Anemia 04/29/2015  . Hyponatremia 04/29/2015  . Coagulopathy (HCC) 04/29/2015  . Hypoalbuminemia 04/29/2015  . Mononucleosis 04/28/2015  . Vitamin D deficiency 06/11/2011  . Major depression (HCC) 06/10/2011    Past Surgical History:  Procedure Laterality Date  . APPENDECTOMY    . CESAREAN SECTION    . COLONOSCOPY    . ESOPHAGOGASTRODUODENOSCOPY     Dr Dulce Sellar ? 2016.   Gerald Leitz GENERIC HISTORICAL  04/20/2016   IR PARACENTESIS 04/20/2016 Darrell K Allred, PA-C [facility]INTER[facility] RAD  . IR PARACENTESIS  03/23/2017  . IR PARACENTESIS  04/08/2017  . IR PARACENTESIS  07/18/2017  . IR PARACENTESIS  09/07/2017  . IR PARACENTESIS  09/14/2017  . IR PARACENTESIS  09/23/2017  . IR PARACENTESIS  10/06/2017  . IR PARACENTESIS  10/14/2017     OB History   None      Home Medications    Prior to Admission medications   Medication Sig Start Date End Date Taking? Authorizing Provider  albuterol (PROVENTIL) (2.5 MG/3ML) 0.083% nebulizer solution Take 3 mLs (2.5 mg total) by nebulization every 4 (four) hours as needed for wheezing or shortness of breath. Patient not taking: Reported on 06/29/2017 11/13/15   Standley Brooking, MD  ARIPiprazole (ABILIFY) 5 MG tablet Take 5 mg by mouth at bedtime.  04/02/15   [provider]  citalopram (CELEXA) 20 MG tablet TAKE 1 TABLET BY MOUTH EVERY NIGHT  12/20/16   [provider]  furosemide (LASIX) 40 MG tablet Take 40 mg by mouth daily.    [provider]  glyBURIDE (DIABETA) 2.5 MG tablet TAKE 1 TABLET BY MOUTH DAILY WITH BREAKFAST 02/12/17   [provider]  methocarbamol (ROBAXIN) 500 MG tablet Take 1 tablet (500 mg total) by mouth 2 (two) times daily. Patient not taking: Reported on 06/29/2017 11/09/16   Audry Pili, PA-C  Milk Thistle Extract 175 MG TABS Take 1 tablet by mouth daily.     [provider]  morphine (MSIR) 30 MG tablet  Take 30 mg by mouth daily. 03/22/17   [provider]  Multiple Vitamins-Minerals (MULTIVITAMIN ADULTS) TABS Take 2 tablets by mouth daily.    [provider]  Nettle, Urtica Dioica, (NETTLE LEAF PO) Take 1 capsule by mouth 2 (two) times daily.    [provider]  ondansetron (ZOFRAN ODT) 4 MG disintegrating tablet Take 1 tablet (4 mg total) by mouth every 8 (eight) hours as needed for nausea or vomiting. 01/07/17   Dorena Bodo, NP  phenazopyridine (PYRIDIUM) 200 MG tablet Take 100 mg by mouth 3 (three) times daily as needed for pain.    [provider]  pioglitazone (ACTOS) 30 MG tablet Take 30 mg by mouth daily.    [provider]  spironolactone (ALDACTONE) 50 MG tablet Take 100 mg by mouth 2 (two) times daily.     [provider]  traZODone (DESYREL) 50 MG tablet Take 50 mg by mouth at bedtime.    [provider]  XIFAXAN 550 MG TABS tablet Take 1 tablet by mouth 2 (two) times daily. 06/10/17   [provider]    Family History Family History  Problem Relation Age of Onset  . Diabetes Mother   . Cancer Father   . Diabetes Sister   . Diabetes Brother     Social History Social History   Tobacco Use  . Smoking status: Current Every Day Smoker    Packs/day: 0.50    Types: Cigarettes  . Smokeless tobacco: Never Used  Substance Use Topics  . Alcohol use: Not Currently    Comment: sober for over 1 year  . Drug use: No     Allergies   Tizanidine   Review of Systems Review of Systems  Constitutional: Negative for fever.  Gastrointestinal: Negative for vomiting.  Skin: Positive for wound.     Physical Exam Updated Vital Signs BP 108/85 (BP Location: Right Arm)   Pulse 70   Temp 98.1 F (36.7 C) (Oral)   Resp 15   Ht 1.626 m ( )   Wt 77.6 kg (171 lb)   LMP 01/29/2013 Comment: Pregnacy test:  SpO2 100%   BMI 29.35 kg/m   Physical Exam CONSTITUTIONAL: Chronically ill-appearing HEAD:  Normocephalic/atraumatic EYES: EOMI ENMT: Mucous membranes moist NECK: supple no meningeal signs LUNGS:  no apparent distress ABDOMEN: soft, obese, distention noted, no focal tenderness.  Left lower abdominal wall there is a small wound draining fluid NEURO: Pt is awake/alert/appropriate, moves all extremitiesx4.  She is conversant EXTREMITIES:  full ROM SKIN: warm, color normal PSYCH: no abnormalities of mood noted, alert and oriented to situation   ED Treatments / Results  Labs (all labs ordered are listed, but only abnormal results are displayed) Labs Reviewed - No data to display  EKG None  Radiology Ir Paracentesis  Result Date: 10/14/2017 INDICATION: History of hepatitis and cirrhosis. Recurrent ascites. Request for diagnostic and therapeutic paracentesis.  EXAM: ULTRASOUND GUIDED LEFT LOWER QUADRANT PARACENTESIS MEDICATIONS: None. COMPLICATIONS: None immediate. PROCEDURE: Informed written consent was obtained from the patient after a discussion of the risks, benefits and alternatives to treatment. A timeout was performed prior to the initiation of the procedure. Initial ultrasound scanning demonstrates a large amount of ascites within the left lower abdominal quadrant. The left lower abdomen was prepped and draped in the usual sterile fashion. 1% lidocaine with epinephrine was used for local anesthesia. Following this, a 19 gauge, 7-cm, Yueh catheter was introduced. An ultrasound image was saved for documentation purposes. The paracentesis was performed. The catheter was removed and a dressing was applied. The patient tolerated the procedure well without immediate post procedural complication. FINDINGS: A total of approximately 5.2 L of hazy, amber colored fluid was removed. Samples were sent to the laboratory as requested by the clinical team. IMPRESSION: Successful ultrasound-guided paracentesis yielding 5.2 liters of peritoneal fluid. Read by: Brayton El PA-C Electronically Signed    By: Malachy Moan M.D.   On: 10/14/2017 10:53    Procedures .Marland KitchenLaceration Repair Date/Time: 10/16/2017 5:00 AM Performed by: Zadie Rhine, MD Authorized by: Zadie Rhine, MD   Consent:    Consent obtained:  Verbal   Consent given by:  Patient Anesthesia (see MAR for exact dosages):    Anesthesia method:  Local infiltration   Local anesthetic:  Lidocaine 1% w/o epi Repair type:    Repair type:  Simple Treatment:    Area cleansed with:  Betadine Skin repair:    Repair method:  Sutures   Suture size:  5-0   Suture material:  Prolene   Number of sutures:  3 Approximation:    Approximation:  Close Post-procedure details:    Patient tolerance of procedure:  Tolerated well, no immediate complications Comments:     I placed 1 suture to close the wound, and one suture on each side.    Medications Ordered in ED Medications  lidocaine (PF) (XYLOCAINE) 1 % injection 30 mL (30 mLs Other Given 10/16/17 0430)     Initial Impression / Assessment and Plan / ED Course  I have reviewed the triage vital signs and the nursing notes.   Patient presents to the emergency department for repeat visit due to leaking fluid from her paracentesis wound.  There is no signs of surrounding infection.  There is no localized tenderness.  The rest of her abdominal exam is nontender I placed 3 sutures about the wound.  The fluid leak appeared to improve.  Also had patient hold pressure for several minutes and then I instructed nursing to bandage the wound. I discussed at length with patient and her son who is at bedside about this wound.  We discussed wound care precautions and when to have sutures removed.  I did tell her that although this should work, she may still leak some fluid. Advise follow-up with her gastroenterologist Dr. Norma Fredrickson  Final Clinical Impressions(s) / ED Diagnoses   Final diagnoses:  History of abdominal paracentesis    ED Discharge Orders    None       Zadie Rhine, MD 10/16/17 [id number]

## 2017-10-16 NOTE — ED Provider Notes (Signed)
[facility] EMERGENCY DEPARTMENT Provider Note   CSN: [id number] Arrival date & time: 10/16/17  1259     History   Chief Complaint Chief Complaint  Patient presents with  . Wound Check    HPI Katrina Cohen is a 52 y.o. female.  HPI Patient is a 52 year old female with complex medical history as below who presents due to leaking of clear yellow fluid from her paracentesis site.  She underwent IR guided paracentesis on 10/14/2017.  She has been leaking fluid since that time.  She was last seen in the emergency department earlier this morning where the site was sutured.  She also endorses generalized fatigue.  She denies any fever or chills.  She denies any abdominal pain.  He continues to have abdominal distention.  Past Medical History:  Diagnosis Date  . Bipolar 2 disorder (HCC)   . Bipolar disorder (HCC) 1980s   with depression, anxiety  . Cirrhosis (HCC)   . Depression   . Depression with anxiety 1980s   [facility] admission 05/2011.    . Diabetes mellitus   . Dysphagia 11/2014   normal esophagram 11/2014. EGD per Dr Dulce Sellar.   . Hepatitis   . Hypertension   . Jaundice 04/2015   In setting of infectious mononucleosis. Ultrasound in [place]: hepatosplenomegaly, portal htn, possible cirrhosis  . Liver disease   . Mononucleosis 04/15/2015    Patient Active Problem List   Diagnosis Date Noted  . Encephalopathy, hepatic (HCC)   . Acute encephalopathy 01/18/2017  . Right upper quadrant abdominal pain 04/02/2016  . UTI (urinary tract infection) 04/02/2016  . Cholelithiasis 04/02/2016  . Depression with anxiety 04/02/2016  . Chronic pain 04/02/2016  . SOB (shortness of breath) 11/12/2015  . COPD exacerbation (HCC) 11/12/2015  . Acute respiratory failure with hypoxia (HCC) 11/12/2015  . CAP (community acquired pneumonia) 11/07/2015  . Cirrhosis (HCC) 11/07/2015  . Thrombocytopenia (HCC) 11/07/2015  . Alcohol abuse 04/30/2015  . Acute hepatitis  04/30/2015  . Tobacco abuse 04/30/2015  . Malnutrition of moderate degree 04/29/2015  . Hypokalemia 04/29/2015  . Anemia 04/29/2015  . Hyponatremia 04/29/2015  . Coagulopathy (HCC) 04/29/2015  . Hypoalbuminemia 04/29/2015  . Mononucleosis 04/28/2015  . Vitamin D deficiency 06/11/2011  . Major depression (HCC) 06/10/2011    Past Surgical History:  Procedure Laterality Date  . APPENDECTOMY    . CESAREAN SECTION    . COLONOSCOPY    . ESOPHAGOGASTRODUODENOSCOPY     Dr Dulce Sellar ? 2016.   Gerald Leitz GENERIC HISTORICAL  04/20/2016   IR PARACENTESIS 04/20/2016 Darrell K Allred, PA-C [facility]  . IR PARACENTESIS  03/23/2017  . IR PARACENTESIS  04/08/2017  . IR PARACENTESIS  07/18/2017  . IR PARACENTESIS  09/07/2017  . IR PARACENTESIS  09/14/2017  . IR PARACENTESIS  09/23/2017  . IR PARACENTESIS  10/06/2017  . IR PARACENTESIS  10/14/2017     OB History   None      Home Medications    Prior to Admission medications   Medication Sig Start Date End Date Taking? Authorizing Provider  albuterol (PROVENTIL) (2.5 MG/3ML) 0.083% nebulizer solution Take 3 mLs (2.5 mg total) by nebulization every 4 (four) hours as needed for wheezing or shortness of breath. Patient not taking: Reported on 06/29/2017 11/13/15   Standley Brooking, MD  ARIPiprazole (ABILIFY) 5 MG tablet Take 5 mg by mouth at bedtime.  04/02/15   [provider]  citalopram (CELEXA) 20 MG tablet TAKE 1 TABLET BY  MOUTH EVERY NIGHT 12/20/16   [provider]  furosemide (LASIX) 40 MG tablet Take 40 mg by mouth daily.    [provider]  glyBURIDE (DIABETA) 2.5 MG tablet TAKE 1 TABLET BY MOUTH DAILY WITH BREAKFAST 02/12/17   [provider]  methocarbamol (ROBAXIN) 500 MG tablet Take 1 tablet (500 mg total) by mouth 2 (two) times daily. Patient not taking: Reported on 06/29/2017 11/09/16   Audry Pili, PA-C  Milk Thistle Extract 175 MG TABS Take 1 tablet by mouth daily.     [provider]  morphine  (MSIR) 30 MG tablet Take 30 mg by mouth daily. 03/22/17   [provider]  Multiple Vitamins-Minerals (MULTIVITAMIN ADULTS) TABS Take 2 tablets by mouth daily.    [provider]  Nettle, Urtica Dioica, (NETTLE LEAF PO) Take 1 capsule by mouth 2 (two) times daily.    [provider]  ondansetron (ZOFRAN ODT) 4 MG disintegrating tablet Take 1 tablet (4 mg total) by mouth every 8 (eight) hours as needed for nausea or vomiting. 01/07/17   Dorena Bodo, NP  phenazopyridine (PYRIDIUM) 200 MG tablet Take 100 mg by mouth 3 (three) times daily as needed for pain.    [provider]  pioglitazone (ACTOS) 30 MG tablet Take 30 mg by mouth daily.    [provider]  spironolactone (ALDACTONE) 50 MG tablet Take 100 mg by mouth 2 (two) times daily.     [provider]  traZODone (DESYREL) 50 MG tablet Take 50 mg by mouth at bedtime.    [provider]  XIFAXAN 550 MG TABS tablet Take 1 tablet by mouth 2 (two) times daily. 06/10/17   [provider]    Family History Family History  Problem Relation Age of Onset  . Diabetes Mother   . Cancer Father   . Diabetes Sister   . Diabetes Brother     Social History Social History   Tobacco Use  . Smoking status: Current Every Day Smoker    Packs/day: 0.50    Types: Cigarettes  . Smokeless tobacco: Never Used  Substance Use Topics  . Alcohol use: Not Currently    Comment: sober for over 1 year  . Drug use: No     Allergies   Tizanidine   Review of Systems Review of Systems  Constitutional: Negative for chills and fever.  HENT: Negative for ear pain and sore throat.   Eyes: Negative for pain and visual disturbance.  Respiratory: Negative for cough and shortness of breath.   Cardiovascular: Negative for chest pain and palpitations.  Gastrointestinal: Positive for abdominal distention. Negative for abdominal pain and vomiting.  Genitourinary: Negative for dysuria and  hematuria.  Musculoskeletal: Negative for arthralgias and back pain.  Skin: Positive for wound. Negative for color change and rash.  Neurological: Negative for seizures and syncope.  All other systems reviewed and are negative.    Physical Exam Updated Vital Signs BP 129/60 (BP Location: Left Arm)   Pulse 69   Temp 98.2 F (36.8 C) (Oral)   Resp (!) 185   LMP 01/29/2013 Comment: Pregnacy test:  SpO2 100%   Physical Exam  Constitutional: She appears well-developed and well-nourished. No distress.  HENT:  Head: Normocephalic and atraumatic.  Eyes: Conjunctivae are normal.  Neck: Neck supple.  Cardiovascular: Normal rate and regular rhythm.  No murmur heard. Pulmonary/Chest: Effort normal and breath sounds normal. No respiratory distress.  Abdominal: Soft. She exhibits distension. There is no tenderness.  Small wound in her left lower quadrant with sutures in place.  There is a very small amount of clear yellow fluid oozing from the site.  The surrounding tissue is normal appearing skin with no concerning findings.  Musculoskeletal: She exhibits no edema.  Neurological: She is alert.  Skin: Skin is warm and dry.  Psychiatric: She has a normal mood and affect.  Nursing note and vitals reviewed.    ED Treatments / Results  Labs (all labs ordered are listed, but only abnormal results are displayed) Labs Reviewed  BASIC METABOLIC PANEL - Abnormal; Notable for the following components:      Result Value   Sodium 130 (*)    Chloride 94 (*)    Glucose, Bld 151 (*)    All other components within normal limits  CBC - Abnormal; Notable for the following components:   WBC 3.5 (*)    RDW 17.1 (*)    Platelets 61 (*)    All other components within normal limits    EKG None  Radiology No results found.  Procedures Procedures (including critical care time)  Medications Ordered in ED Medications - No data to display   Initial Impression / Assessment and Plan / ED  Course  I have reviewed the triage vital signs and the nursing notes.  Pertinent labs & imaging results that were available during my care of the patient were reviewed by me and considered in my medical decision making (see chart for details).    Patient is a 52 year old female with history as above who presents due to ongoing leak from her recent paracentesis site.  She was seen here earlier this morning where her site was sutured.  The skin around the site is well-appearing.  There are no signs of cellulitis.  Her abdomen is soft.  She does continue to have abdominal distention.  Her labs were reviewed which appear to be consistent with her baseline.  I applied Dermabond over the wound and this stopped the leakage.  Return precautions were discussed.  She does not have any signs of infection at this time.  Patient discharged in stable condition.  Final Clinical Impressions(s) / ED Diagnoses   Final diagnoses:  Visit for wound check    ED Discharge Orders    None       Lennette Bihari, MD 10/16/17 [id number]    Derwood Kaplan, MD 10/17/17 [phone]

## 2017-10-16 NOTE — Discharge Instructions (Addendum)
Please keep bandage on until tonight You can have sutures removed in one week

## 2017-10-16 NOTE — ED Notes (Signed)
Dressing to abdomen changed.

## 2017-10-16 NOTE — ED Triage Notes (Signed)
Pt presents for reassessment of leakage from paracentesis site. States had procedure on Friday and was seen last night for same. Continues to have serosanguinous fluid leakage. Reports increased fatigue and family is concerned about anemia and drowsiness.

## 2017-10-18 ENCOUNTER — Encounter: Admission: RE | Payer: Self-pay | Source: Ambulatory Visit

## 2017-10-18 ENCOUNTER — Ambulatory Visit: Admission: RE | Admit: 2017-10-18 | Payer: Medicaid Other | Source: Ambulatory Visit | Admitting: Internal Medicine

## 2017-10-18 HISTORY — DX: Bipolar II disorder: F31.81

## 2017-10-18 SURGERY — COLONOSCOPY WITH PROPOFOL
Anesthesia: General

## 2017-10-19 LAB — CULTURE, BODY FLUID W GRAM STAIN -BOTTLE: Culture: NO GROWTH

## 2017-10-20 ENCOUNTER — Other Ambulatory Visit: Payer: Self-pay | Admitting: Internal Medicine

## 2017-10-20 DIAGNOSIS — K7031 Alcoholic cirrhosis of liver with ascites: Secondary | ICD-10-CM

## 2017-10-21 ENCOUNTER — Ambulatory Visit (HOSPITAL_COMMUNITY)
Admission: RE | Admit: 2017-10-21 | Discharge: 2017-10-21 | Disposition: A | Discharge status: Home / Self Care | Payer: Medicaid Other | Source: Ambulatory Visit | Attending: Internal Medicine | Admitting: Internal Medicine

## 2017-10-21 ENCOUNTER — Encounter (HOSPITAL_COMMUNITY): Payer: Self-pay | Admitting: Student

## 2017-10-21 DIAGNOSIS — R188 Other ascites: Secondary | ICD-10-CM | POA: Insufficient documentation

## 2017-10-21 DIAGNOSIS — K7031 Alcoholic cirrhosis of liver with ascites: Secondary | ICD-10-CM

## 2017-10-21 HISTORY — PX: IR PARACENTESIS: IMG2679

## 2017-10-21 LAB — SODIUM: Sodium: 129 mmol/L — ABNORMAL LOW (ref 135–145)

## 2017-10-21 LAB — ALBUMIN, PLEURAL OR PERITONEAL FLUID

## 2017-10-21 LAB — ALBUMIN: ALBUMIN: 3 g/dL — AB (ref 3.5–5.0)

## 2017-10-21 LAB — BODY FLUID CELL COUNT WITH DIFFERENTIAL
Lymphs, Fluid: 45 %
MONOCYTE-MACROPHAGE-SEROUS FLUID: 50 % (ref 50–90)
Neutrophil Count, Fluid: 5 % (ref 0–25)
Total Nucleated Cell Count, Fluid: 81 cu mm (ref 0–1000)

## 2017-10-21 MED ORDER — LIDOCAINE HCL 1 % IJ SOLN
INTRAMUSCULAR | Status: AC
  Filled 2017-10-21: qty 20

## 2017-10-21 MED ORDER — LIDOCAINE HCL (PF) 2 % IJ SOLN
INTRAMUSCULAR | Status: DC | PRN
  Administered 2017-10-21: 10 mL

## 2017-10-21 MED ORDER — ALBUMIN HUMAN 25 % IV SOLN
25.0000 g | Freq: Once | INTRAVENOUS | Status: AC
  Administered 2017-10-21: 25 g via INTRAVENOUS
  Filled 2017-10-21: qty 100

## 2017-10-21 NOTE — Procedures (Signed)
PROCEDURE SUMMARY:  Successful US guided paracentesis from left lateral abdomen.  Yielded 5.8 liters of cloudy fluid.  No immediate complications.  Pt tolerated well.   Specimen was sent for labs.  Hoyt KochKacie Sue-Ellen Matthews PA-C 10/21/2017 3:47 PM

## 2017-10-24 LAB — PATHOLOGIST SMEAR REVIEW

## 2017-10-27 ENCOUNTER — Observation Stay: Payer: Medicaid Other

## 2017-10-27 ENCOUNTER — Other Ambulatory Visit: Payer: Self-pay

## 2017-10-27 ENCOUNTER — Emergency Department: Payer: Medicaid Other

## 2017-10-27 ENCOUNTER — Observation Stay
Admission: EM | Admit: 2017-10-27 | Discharge: 2017-10-28 | Disposition: A | Discharge status: Home / Self Care | Payer: Medicaid Other | Attending: Internal Medicine | Admitting: Internal Medicine

## 2017-10-27 ENCOUNTER — Encounter: Payer: Self-pay | Admitting: Emergency Medicine

## 2017-10-27 DIAGNOSIS — I10 Essential (primary) hypertension: Secondary | ICD-10-CM | POA: Insufficient documentation

## 2017-10-27 DIAGNOSIS — Z79899 Other long term (current) drug therapy: Secondary | ICD-10-CM | POA: Insufficient documentation

## 2017-10-27 DIAGNOSIS — N390 Urinary tract infection, site not specified: Secondary | ICD-10-CM | POA: Insufficient documentation

## 2017-10-27 DIAGNOSIS — E1165 Type 2 diabetes mellitus with hyperglycemia: Secondary | ICD-10-CM | POA: Diagnosis not present

## 2017-10-27 DIAGNOSIS — D689 Coagulation defect, unspecified: Secondary | ICD-10-CM | POA: Diagnosis not present

## 2017-10-27 DIAGNOSIS — Z794 Long term (current) use of insulin: Secondary | ICD-10-CM | POA: Insufficient documentation

## 2017-10-27 DIAGNOSIS — K7031 Alcoholic cirrhosis of liver with ascites: Principal | ICD-10-CM | POA: Insufficient documentation

## 2017-10-27 DIAGNOSIS — F17210 Nicotine dependence, cigarettes, uncomplicated: Secondary | ICD-10-CM | POA: Insufficient documentation

## 2017-10-27 DIAGNOSIS — K652 Spontaneous bacterial peritonitis: Secondary | ICD-10-CM

## 2017-10-27 DIAGNOSIS — R188 Other ascites: Secondary | ICD-10-CM

## 2017-10-27 DIAGNOSIS — R140 Abdominal distension (gaseous): Secondary | ICD-10-CM | POA: Diagnosis not present

## 2017-10-27 DIAGNOSIS — F319 Bipolar disorder, unspecified: Secondary | ICD-10-CM | POA: Insufficient documentation

## 2017-10-27 DIAGNOSIS — F418 Other specified anxiety disorders: Secondary | ICD-10-CM | POA: Diagnosis not present

## 2017-10-27 DIAGNOSIS — R1084 Generalized abdominal pain: Secondary | ICD-10-CM

## 2017-10-27 DIAGNOSIS — K7460 Unspecified cirrhosis of liver: Secondary | ICD-10-CM | POA: Diagnosis present

## 2017-10-27 DIAGNOSIS — D649 Anemia, unspecified: Secondary | ICD-10-CM | POA: Insufficient documentation

## 2017-10-27 DIAGNOSIS — E8809 Other disorders of plasma-protein metabolism, not elsewhere classified: Secondary | ICD-10-CM | POA: Diagnosis not present

## 2017-10-27 DIAGNOSIS — E871 Hypo-osmolality and hyponatremia: Secondary | ICD-10-CM | POA: Insufficient documentation

## 2017-10-27 LAB — ALBUMIN, PLEURAL OR PERITONEAL FLUID: Albumin, Fluid: <1 g/dL

## 2017-10-27 LAB — BODY FLUID CELL COUNT WITH DIFFERENTIAL
Eos, Fluid: 0 %
LYMPHS FL: 68 %
Monocyte-Macrophage-Serous Fluid: 30 %
NEUTROPHIL FLUID: 2 %
Total Nucleated Cell Count, Fluid: 118 cu mm

## 2017-10-27 LAB — COMPREHENSIVE METABOLIC PANEL
ALK PHOS: 195 U/L — AB (ref 38–126)
ALT: 26 U/L (ref 14–54)
ANION GAP: 7 (ref 5–15)
AST: 57 U/L — ABNORMAL HIGH (ref 15–41)
Albumin: 2.9 g/dL — ABNORMAL LOW (ref 3.5–5.0)
BILIRUBIN TOTAL: 4.6 mg/dL — AB (ref 0.3–1.2)
BUN: 17 mg/dL (ref 6–20)
CALCIUM: 8.4 mg/dL — AB (ref 8.9–10.3)
CO2: 24 mmol/L (ref 22–32)
CREATININE: 0.79 mg/dL (ref 0.44–1.00)
Chloride: 95 mmol/L — ABNORMAL LOW (ref 101–111)
Glucose, Bld: 178 mg/dL — ABNORMAL HIGH (ref 65–99)
Potassium: 4.1 mmol/L (ref 3.5–5.1)
Sodium: 126 mmol/L — ABNORMAL LOW (ref 135–145)
TOTAL PROTEIN: 5.6 g/dL — AB (ref 6.5–8.1)

## 2017-10-27 LAB — URINALYSIS, COMPLETE (UACMP) WITH MICROSCOPIC
Bilirubin Urine: NEGATIVE
Glucose, UA: NEGATIVE mg/dL
Hgb urine dipstick: NEGATIVE
Ketones, ur: NEGATIVE mg/dL
Nitrite: NEGATIVE
Protein, ur: NEGATIVE mg/dL
Specific Gravity, Urine: 1.016 (ref 1.005–1.030)
WBC, UA: >50 WBC/hpf — ABNORMAL HIGH (ref 0–5)
pH: 6 (ref 5.0–8.0)

## 2017-10-27 LAB — CBC
HCT: 33.3 % — ABNORMAL LOW (ref 35.0–47.0)
Hemoglobin: 11.3 g/dL — ABNORMAL LOW (ref 12.0–16.0)
MCH: 30.7 pg (ref 26.0–34.0)
MCHC: 34 g/dL (ref 32.0–36.0)
MCV: 90.5 fL (ref 80.0–100.0)
PLATELETS: 71 10*3/uL — AB (ref 150–440)
RBC: 3.67 MIL/uL — ABNORMAL LOW (ref 3.80–5.20)
RDW: 18 % — ABNORMAL HIGH (ref 11.5–14.5)
WBC: 3.6 10*3/uL (ref 3.6–11.0)

## 2017-10-27 LAB — GLUCOSE, CAPILLARY
GLUCOSE-CAPILLARY: 109 mg/dL — AB (ref 65–99)
Glucose-Capillary: 160 mg/dL — ABNORMAL HIGH (ref 65–99)
Glucose-Capillary: 114 mg/dL — ABNORMAL HIGH (ref 65–99)
Glucose-Capillary: 133 mg/dL — ABNORMAL HIGH (ref 65–99)

## 2017-10-27 LAB — LACTIC ACID, PLASMA: Lactic Acid, Venous: 1.4 mmol/L (ref 0.5–1.9)

## 2017-10-27 LAB — LIPASE, BLOOD: LIPASE: 52 U/L — AB (ref 11–51)

## 2017-10-27 LAB — PROTIME-INR
INR: 1.62
PROTHROMBIN TIME: 19.1 s — AB (ref 11.4–15.2)

## 2017-10-27 LAB — PROTEIN, PLEURAL OR PERITONEAL FLUID

## 2017-10-27 LAB — AMMONIA: Ammonia: 38 umol/L — ABNORMAL HIGH (ref 9–35)

## 2017-10-27 MED ORDER — CIPROFLOXACIN HCL 500 MG PO TABS
500.0000 mg | ORAL_TABLET | Freq: Every day | ORAL | Status: DC
  Administered 2017-10-28: 500 mg via ORAL
  Filled 2017-10-27: qty 1

## 2017-10-27 MED ORDER — MORPHINE SULFATE ER 15 MG PO TBCR
30.0000 mg | EXTENDED_RELEASE_TABLET | Freq: Every day | ORAL | Status: DC
  Administered 2017-10-27 – 2017-10-28 (×2): 30 mg via ORAL
  Filled 2017-10-27 (×2): qty 2

## 2017-10-27 MED ORDER — FUROSEMIDE 40 MG PO TABS
40.0000 mg | ORAL_TABLET | Freq: Every day | ORAL | Status: DC
  Administered 2017-10-27: 40 mg via ORAL
  Filled 2017-10-27: qty 1

## 2017-10-27 MED ORDER — OXYCODONE HCL 5 MG PO TABS: 5.0000 mg | ORAL_TABLET | ORAL | Status: DC | PRN

## 2017-10-27 MED ORDER — SPIRONOLACTONE 100 MG PO TABS
100.0000 mg | ORAL_TABLET | Freq: Two times a day (BID) | ORAL | Status: DC
  Administered 2017-10-27 – 2017-10-28 (×3): 100 mg via ORAL
  Filled 2017-10-27 (×3): qty 4
  Filled 2017-10-27 (×4): qty 1

## 2017-10-27 MED ORDER — MORPHINE SULFATE (PF) 4 MG/ML IV SOLN
4.0000 mg | Freq: Once | INTRAVENOUS | Status: AC
  Administered 2017-10-27: 4 mg via INTRAVENOUS

## 2017-10-27 MED ORDER — ALBUMIN HUMAN 25 % IV SOLN
25.0000 g | Freq: Four times a day (QID) | INTRAVENOUS | Status: DC
  Filled 2017-10-27 (×3): qty 100

## 2017-10-27 MED ORDER — ONDANSETRON HCL 4 MG PO TABS: 4.0000 mg | ORAL_TABLET | Freq: Four times a day (QID) | ORAL | Status: DC | PRN

## 2017-10-27 MED ORDER — OXYCODONE HCL 5 MG PO TABS: 10.0000 mg | ORAL_TABLET | ORAL | Status: DC | PRN

## 2017-10-27 MED ORDER — ARIPIPRAZOLE 5 MG PO TABS
5.0000 mg | ORAL_TABLET | Freq: Every day | ORAL | Status: DC
  Administered 2017-10-27: 5 mg via ORAL
  Filled 2017-10-27 (×2): qty 1

## 2017-10-27 MED ORDER — ALBUTEROL SULFATE (2.5 MG/3ML) 0.083% IN NEBU: 2.5000 mg | INHALATION_SOLUTION | RESPIRATORY_TRACT | Status: DC | PRN

## 2017-10-27 MED ORDER — ONDANSETRON HCL 4 MG/2ML IJ SOLN
4.0000 mg | Freq: Once | INTRAMUSCULAR | Status: AC
  Administered 2017-10-27: 4 mg via INTRAVENOUS

## 2017-10-27 MED ORDER — OXYCODONE HCL ER 20 MG PO T12A: 20.0000 mg | EXTENDED_RELEASE_TABLET | Freq: Two times a day (BID) | ORAL | Status: DC

## 2017-10-27 MED ORDER — CITALOPRAM HYDROBROMIDE 20 MG PO TABS
20.0000 mg | ORAL_TABLET | Freq: Every day | ORAL | Status: DC
  Administered 2017-10-27: 20 mg via ORAL
  Filled 2017-10-27: qty 1

## 2017-10-27 MED ORDER — FUROSEMIDE 10 MG/ML IJ SOLN
40.0000 mg | Freq: Three times a day (TID) | INTRAMUSCULAR | Status: DC
  Administered 2017-10-27 – 2017-10-28 (×3): 40 mg via INTRAVENOUS
  Filled 2017-10-27 (×3): qty 4

## 2017-10-27 MED ORDER — METHOCARBAMOL 500 MG PO TABS
500.0000 mg | ORAL_TABLET | Freq: Two times a day (BID) | ORAL | Status: DC
  Administered 2017-10-27 (×2): 500 mg via ORAL
  Filled 2017-10-27 (×4): qty 1

## 2017-10-27 MED ORDER — BISACODYL 5 MG PO TBEC: 5.0000 mg | DELAYED_RELEASE_TABLET | Freq: Every day | ORAL | Status: DC | PRN

## 2017-10-27 MED ORDER — SENNOSIDES-DOCUSATE SODIUM 8.6-50 MG PO TABS: 1.0000 | ORAL_TABLET | Freq: Every evening | ORAL | Status: DC | PRN

## 2017-10-27 MED ORDER — ENOXAPARIN SODIUM 40 MG/0.4ML ~~LOC~~ SOLN: 40.0000 mg | SUBCUTANEOUS | Status: DC

## 2017-10-27 MED ORDER — ALBUMIN HUMAN 25 % IV SOLN
75.0000 g | Freq: Once | INTRAVENOUS | Status: AC
  Administered 2017-10-27: 75 g via INTRAVENOUS
  Filled 2017-10-27: qty 300

## 2017-10-27 MED ORDER — ONDANSETRON HCL 4 MG/2ML IJ SOLN: 4.0000 mg | Freq: Four times a day (QID) | INTRAMUSCULAR | Status: DC | PRN

## 2017-10-27 MED ORDER — OXYCODONE HCL ER 10 MG PO T12A: 20.0000 mg | EXTENDED_RELEASE_TABLET | Freq: Two times a day (BID) | ORAL | Status: DC

## 2017-10-27 MED ORDER — ONDANSETRON HCL 4 MG/2ML IJ SOLN
INTRAMUSCULAR | Status: AC
  Administered 2017-10-27: 4 mg via INTRAVENOUS
  Filled 2017-10-27: qty 2

## 2017-10-27 MED ORDER — MORPHINE SULFATE (PF) 4 MG/ML IV SOLN
INTRAVENOUS | Status: AC
  Administered 2017-10-27: 4 mg via INTRAVENOUS
  Filled 2017-10-27: qty 1

## 2017-10-27 MED ORDER — TRAZODONE HCL 50 MG PO TABS
50.0000 mg | ORAL_TABLET | Freq: Every day | ORAL | Status: DC
  Administered 2017-10-27: 50 mg via ORAL
  Filled 2017-10-27: qty 1

## 2017-10-27 MED ORDER — LEVOFLOXACIN IN D5W 750 MG/150ML IV SOLN
750.0000 mg | Freq: Once | INTRAVENOUS | Status: AC
  Administered 2017-10-27: 750 mg via INTRAVENOUS
  Filled 2017-10-27: qty 150

## 2017-10-27 MED ORDER — INSULIN ASPART 100 UNIT/ML ~~LOC~~ SOLN
0.0000 [IU] | Freq: Three times a day (TID) | SUBCUTANEOUS | Status: DC
  Filled 2017-10-27: qty 1

## 2017-10-27 MED ORDER — RIFAXIMIN 550 MG PO TABS
550.0000 mg | ORAL_TABLET | Freq: Two times a day (BID) | ORAL | Status: DC
  Administered 2017-10-27 – 2017-10-28 (×3): 550 mg via ORAL
  Filled 2017-10-27 (×4): qty 1

## 2017-10-27 NOTE — ED Triage Notes (Signed)
Pt to triage via w/c, appears uncomfortable; reports paracentesis on Friday but having persistent abd pain/swelling

## 2017-10-27 NOTE — Procedures (Signed)
Ascites, abd distention  4.8 L para  No comp Stable EBL 0 Full report in pacs

## 2017-10-27 NOTE — Progress Notes (Signed)
[employer] - [place] [facility]                                                                                                                                                                                  Patient Demographics   Katrina Cohen, is a 52 y.o. female, DOB - 1965/09/30, ZOX:[id number]  Admit date - 10/27/2017   Admitting Physician Barbaraann Rondo, MD  Outpatient Primary MD for the patient is Piedad Climes, [place] E, PA-C   LOS - 0  Subjective: Patient admitted with progressive distention of the abdomen as well as lower extremity swelling She has been noncompliant with her salt intake as well as her diuretics She underwent paracentesis with 4 L of fluid removed   Review of Systems:   CONSTITUTIONAL: No documented fever. No fatigue, weakness. No weight gain, no weight loss.  EYES: No blurry or double vision.  ENT: No tinnitus. No postnasal drip. No redness of the oropharynx.  RESPIRATORY: No cough, no wheeze, no hemoptysis. No dyspnea.  CARDIOVASCULAR: No chest pain. No orthopnea. No palpitations. No syncope.  Positive lower extremity edema GASTROINTESTINAL: No nausea, no vomiting or diarrhea. No abdominal pain. No melena or hematochezia.  Positive abdominal distention GENITOURINARY: No dysuria or hematuria.  ENDOCRINE: No polyuria or nocturia. No heat or cold intolerance.  HEMATOLOGY: No anemia. No bruising. No bleeding.  INTEGUMENTARY: No rashes. No lesions.  MUSCULOSKELETAL: No arthritis. No swelling. No gout.  NEUROLOGIC: No numbness, tingling, or ataxia. No seizure-type activity.  PSYCHIATRIC: No anxiety. No insomnia. No ADD.    Vitals:   Vitals:   10/27/17 0819 10/27/17 0903 10/27/17 0945 10/27/17 1158  BP: 109/65 (!) 106/59 (!) 101/51 109/60  Pulse: 68 66 70 73  Resp: 18 18 18 18   Temp: 97.7 F (36.5 C)   97.8 F (36.6 C)  TempSrc: Oral   Oral  SpO2: 100% 100% 99% 99%  Weight:      Height:        Wt Readings from Last 3  Encounters:  10/27/17 87.5 kg (193 lb)  10/15/17 77.6 kg (171 lb)  10/15/17 67.6 kg (149 lb)     Intake/Output Summary (Last 24 hours) at 10/27/2017 1446 Last data filed at 10/27/2017 1358 Gross per 24 hour  Intake 150 ml  Output 700 ml  Net -550 ml    Physical Exam:   GENERAL: Pleasant-appearing in no apparent distress.  HEAD, EYES, EARS, NOSE AND THROAT: Atraumatic, normocephalic. Extraocular muscles are intact. Pupils equal and reactive to light. Sclerae anicteric. No conjunctival injection. No oro-pharyngeal erythema.  NECK: Supple. There is no jugular venous distention. No bruits, no lymphadenopathy, no thyromegaly.  HEART: Regular rate  and rhythm,. No murmurs, no rubs, no clicks.  LUNGS: Clear to auscultation bilaterally. No rales or rhonchi. No wheezes.  ABDOMEN: Soft, flat, nontender, distended has good bowel sounds. No hepatosplenomegaly appreciated.  EXTREMITIES: No evidence of any cyanosis, clubbing, or 2+ peripheral edema.  +2 pedal and radial pulses bilaterally.  NEUROLOGIC: The patient is alert, awake, and oriented x3 with no focal motor or sensory deficits appreciated bilaterally.  SKIN: Moist and warm with no rashes appreciated.  Psych: Not anxious, depressed LN: No inguinal LN enlargement    Antibiotics   Anti-infectives (From admission, onward)   Start     Dose/Rate Route Frequency Ordered Stop   10/27/17 1430  ciprofloxacin (CIPRO) tablet 500 mg    Note to Pharmacy:  SBP Prophylaxis   500 mg Oral Daily with breakfast 10/27/17 1421     10/27/17 1000  rifaximin (XIFAXAN) tablet 550 mg     550 mg Oral 2 times daily 10/27/17 0821     10/27/17 0530  levofloxacin (LEVAQUIN) IVPB 750 mg     750 mg 100 mL/hr over 90 Minutes Intravenous  Once 10/27/17 0521 10/27/17 0821      Medications   Scheduled Meds: . ARIPiprazole  5 mg Oral QHS  . ciprofloxacin  500 mg Oral Q breakfast  . citalopram  20 mg Oral QHS  . enoxaparin (LOVENOX) injection  40 mg Subcutaneous  Q24H  . furosemide  40 mg Intravenous Q8H  . insulin aspart  0-15 Units Subcutaneous TID WC  . methocarbamol  500 mg Oral BID  . morphine  30 mg Oral Daily  . rifaximin  550 mg Oral BID  . spironolactone  100 mg Oral BID  . traZODone  50 mg Oral QHS   Continuous Infusions: . albumin human     PRN Meds:.albuterol, bisacodyl, ondansetron **OR** ondansetron (ZOFRAN) IV, oxyCODONE, senna-docusate   Data Review:   Micro Results Recent Results (from the past 240 hour(s))  Body fluid culture     Status: None (Preliminary result)   Collection Time: 10/27/17  9:20 AM  Result Value Ref Range Status   Specimen Description   Final    PERITONEAL Performed at [facility], [address] [place], [place] [address]    Special Requests   Final    NONE Performed at [facility], [address] [place], [place] [address]    Gram Stain   Final    RARE WBC PRESENT, PREDOMINANTLY MONONUCLEAR NO ORGANISMS SEEN Performed at [facility], [address] [place], [place] [address]    Culture PENDING  Incomplete   Report Status PENDING  Incomplete    Radiology Reports [place] Paracentesis  Result Date: 10/27/2017 INDICATION: Cirrhosis, ascites, abdominal distension EXAM: ULTRASOUND GUIDED RIGHT PARACENTESIS MEDICATIONS: 1% LIDOCAINE LOCAL COMPLICATIONS: None immediate. PROCEDURE: Informed written consent was obtained from the patient after a discussion of the risks, benefits and alternatives to treatment. A timeout was performed prior to the initiation of the procedure. Initial ultrasound scanning demonstrates a large amount of ascites within the right lower abdominal quadrant. The right lower abdomen was prepped and draped in the usual sterile fashion. 1% lidocaine with epinephrine was used for local anesthesia. Following this, a 6 Fr Safe-T-Centesis catheter was introduced. An ultrasound image was saved for documentation purposes. The paracentesis was performed. The catheter  was removed and a dressing was applied. The patient tolerated the procedure well without immediate post procedural complication. FINDINGS: A total of approximately 4.8 L of clear  peritoneal fluid was removed. Samples were sent to the laboratory as requested by the clinical team. IMPRESSION: Successful ultrasound-guided paracentesis yielding 4.8 liters of peritoneal fluid. Electronically Signed   By: Judie Petit.  Shick M.D.   On: 10/27/2017 10:00   Dg Chest Port 1 View  Result Date: 10/27/2017 CLINICAL DATA:  Abdominal pain and distension EXAM: PORTABLE CHEST 1 VIEW COMPARISON:  07/12/2016 FINDINGS: The heart size and mediastinal contours are within normal limits. Both lungs are clear. The visualized skeletal structures are unremarkable. IMPRESSION: No active disease. Electronically Signed   By: Deatra Robinson M.D.   On: 10/27/2017 06:05   Ir Paracentesis  Result Date: 10/21/2017 INDICATION: Patient with recurrent ascites. Request is made for diagnostic and therapeutic paracentesis. EXAM: ULTRASOUND GUIDED DIAGNOSTIC AND THERAPEUTIC PARACENTESIS MEDICATIONS: 10 mL 2% lidocaine COMPLICATIONS: None immediate. PROCEDURE: Informed written consent was obtained from the patient after a discussion of the risks, benefits and alternatives to treatment. A timeout was performed prior to the initiation of the procedure. Initial ultrasound scanning demonstrates a large amount of ascites within the left lateral abdomen. The left lateral abdomen was prepped and draped in the usual sterile fashion. 2% lidocaine was used for local anesthesia. Following this, a 19 gauge, 7-cm, Yueh catheter was introduced. An ultrasound image was saved for documentation purposes. The paracentesis was performed. The catheter was removed and a dressing was applied. The patient tolerated the procedure well without immediate post procedural complication. FINDINGS: A total of approximately 5.8 liters of cloudy, pink fluid was removed. Samples were sent to the  laboratory as requested by the clinical team. IMPRESSION: Successful ultrasound-guided diagnostic and therapeutic paracentesis yielding 5.8 liters of peritoneal fluid. Read by: Loyce Dys PA-C Electronically Signed   By: Malachy Moan M.D.   On: 10/21/2017 15:57   Ir Paracentesis  Result Date: 10/14/2017 INDICATION: History of hepatitis and cirrhosis. Recurrent ascites. Request for diagnostic and therapeutic paracentesis. EXAM: ULTRASOUND GUIDED LEFT LOWER QUADRANT PARACENTESIS MEDICATIONS: None. COMPLICATIONS: None immediate. PROCEDURE: Informed written consent was obtained from the patient after a discussion of the risks, benefits and alternatives to treatment. A timeout was performed prior to the initiation of the procedure. Initial ultrasound scanning demonstrates a large amount of ascites within the left lower abdominal quadrant. The left lower abdomen was prepped and draped in the usual sterile fashion. 1% lidocaine with epinephrine was used for local anesthesia. Following this, a 19 gauge, 7-cm, Yueh catheter was introduced. An ultrasound image was saved for documentation purposes. The paracentesis was performed. The catheter was removed and a dressing was applied. The patient tolerated the procedure well without immediate post procedural complication. FINDINGS: A total of approximately 5.2 L of hazy, amber colored fluid was removed. Samples were sent to the laboratory as requested by the clinical team. IMPRESSION: Successful ultrasound-guided paracentesis yielding 5.2 liters of peritoneal fluid. Read by: Brayton El PA-C Electronically Signed   By: Malachy Moan M.D.   On: 10/14/2017 10:53   Ir Paracentesis  Result Date: 10/06/2017 INDICATION: Abdominal distention secondary to recurrent ascites. Request for diagnostic and therapeutic paracentesis. EXAM: ULTRASOUND GUIDED LEFT LOWER QUADRANT PARACENTESIS MEDICATIONS: None. COMPLICATIONS: None immediate. PROCEDURE: Informed written  consent was obtained from the patient after a discussion of the risks, benefits and alternatives to treatment. A timeout was performed prior to the initiation of the procedure. Initial ultrasound scanning demonstrates a large amount of ascites within the left lower abdominal quadrant. The left lower abdomen was prepped and draped in the usual sterile fashion. 1% lidocaine  with epinephrine was used for local anesthesia. Following this, a 19 gauge, 7-cm, Yueh catheter was introduced. An ultrasound image was saved for documentation purposes. The paracentesis was performed. The catheter was removed and a dressing was applied. The patient tolerated the procedure well without immediate post procedural complication. FINDINGS: A total of approximately 6 L of hazy, amber colored fluid was removed. Samples were sent to the laboratory as requested by the clinical team. IMPRESSION: Successful ultrasound-guided paracentesis yielding 6 liters of peritoneal fluid. Read by: Brayton El PA-C Electronically Signed   By: Simonne Come M.D.   On: 10/06/2017 11:55     CBC Recent Labs  Lab 10/27/17 0510  WBC 3.6  HGB 11.3*  HCT 33.3*  PLT 71*  MCV 90.5  MCH 30.7  MCHC 34.0  RDW 18.0*    Chemistries  Recent Labs  Lab 10/21/17 1515 10/27/17 0510  NA 129* 126*  K  --  4.1  CL  --  95*  CO2  --  24  GLUCOSE  --  178*  BUN  --  17  CREATININE  --  0.79  CALCIUM  --  8.4*  AST  --  57*  ALT  --  26  ALKPHOS  --  195*  BILITOT  --  4.6*   ------------------------------------------------------------------------------------------------------------------ estimated creatinine clearance is 88 mL/min (by C-G formula based on SCr of 0.79 mg/dL). ------------------------------------------------------------------------------------------------------------------ No results for input(s): HGBA1C in the last 72  hours. ------------------------------------------------------------------------------------------------------------------ No results for input(s): CHOL, HDL, LDLCALC, TRIG, CHOLHDL, LDLDIRECT in the last 72 hours. ------------------------------------------------------------------------------------------------------------------ No results for input(s): TSH, T4TOTAL, T3FREE, THYROIDAB in the last 72 hours.  Invalid input(s): FREET3 ------------------------------------------------------------------------------------------------------------------ No results for input(s): VITAMINB12, FOLATE, FERRITIN, TIBC, IRON, RETICCTPCT in the last 72 hours.  Coagulation profile Recent Labs  Lab 10/27/17 0523  INR 1.62    No results for input(s): DDIMER in the last 72 hours.  Cardiac Enzymes No results for input(s): CKMB, TROPONINI, MYOGLOBIN in the last 168 hours.  Invalid input(s): CK ------------------------------------------------------------------------------------------------------------------ Invalid input(s): POCBNP    Assessment & Plan  Patient is 52 year old with liver cirrhosis #1 abdominal pain this is due to ascites Status post paracentesis Patient noncompliant with diuretics No evidence of SBP Prophylactic Cipro  #2 liver cirrhosis -MELD score 26, 19.6% estimated 27mo mortality -Childs-Pugh Class C, 1-25yr life expectancy, 82% abdominal surgery perioperative mortality -For transplant evaluation at [facility] in end June  #3 anasarca I will give patient albumin along with IV Lasix to see if we can diurese her some  #4 hyponatremia due to fluid overload we will repeat sodium level in the morning        Code Status Orders  (From admission, onward)        Start     Ordered   10/27/17 0821  Full code  Continuous     10/27/17 0820    Code Status History    Date Active Date Inactive Code Status Order ID Comments User Context   01/18/2017 2156 01/21/2017 1735 Full Code  [id number]  Eduard Clos, MD ED   04/02/2016 0506 04/03/2016 1524 Full Code [id number]  Briscoe Deutscher, MD ED   11/12/2015 1753 11/13/2015 1502 Full Code [id number]  Standley Brooking, MD ED   11/07/2015 0637 11/08/2015 1423 Full Code [id number]  Michael Litter, MD Inpatient   04/28/2015 2111 04/30/2015 2323 Full Code [id number]  Eston Esters, MD ED   06/07/2011 1321 06/08/2011 2157 Full Code [id number]  Dione Booze, MD ED  05/27/2011 0912 05/27/2011 1811 Full Code [id number]  Remi Haggard, NP ED           Consults gi  DVT Prophylaxis  scd's  Lab Results  Component Value Date   PLT 71 (L) 10/27/2017     Time Spent in minutes Greater than 50% of time spent in care coordination and counseling patient regarding the condition and plan of care.   Auburn Bilberry M.D on 10/27/2017 at 2:46 PM  Between 7am to 6pm - Pager - [phone]  After 6pm go to [email or web address] - Social research officer, government  [employer]   Office  [phone]

## 2017-10-27 NOTE — H&P (Signed)
[facility] - [place] at [facility]   PATIENT NAME: Katrina Cohen    MR#:  [id number]  DATE OF BIRTH:  [birth year]  DATE OF ADMISSION:  10/27/2017  PRIMARY CARE PHYSICIAN: Burnis Medin, [place]   REQUESTING/REFERRING PHYSICIAN: Irean Hong, MD  CHIEF COMPLAINT:   Chief Complaint  Patient presents with  . Abdominal Pain    HISTORY OF PRESENT ILLNESS:  Katrina Cohen  is a 52 y.o. female with a known history of EtOH cirrhosis/ESLD (w/ recurrent ascites, requiring outpt IR-guided paracentesis q2wks, last para Friday 5/31) p/w ascites. As noted above, had para on Friday, -5.8L. P/w recurrent ascites since then, rapid reaccumulation. States she has been off aldactone x1wk, states Rx refill was rejected? Endorses diffuse abdominal discomfort/distension/tightness/pain. (+) nausea, (-) vomiting. (-) F/C, diaphoresis, night sweats, rigors. GI @ [facility]. Scheduled for transplant evaluation @ [facility] 06/25-07/01.  PAST MEDICAL HISTORY:   Past Medical History:  Diagnosis Date  . Bipolar 2 disorder (HCC)   . Bipolar disorder (HCC) 1980s   with depression, anxiety  . Cirrhosis (HCC)   . Depression   . Depression with anxiety 1980s   [facility] admission 05/2011.    . Diabetes mellitus   . Dysphagia 11/2014   normal esophagram 11/2014. EGD per Dr Dulce Sellar.   . Hepatitis   . Hypertension   . Jaundice 04/2015   In setting of infectious mononucleosis. Ultrasound in [place]: hepatosplenomegaly, portal htn, possible cirrhosis  . Liver disease   . Mononucleosis 04/15/2015    PAST SURGICAL HISTORY:   Past Surgical History:  Procedure Laterality Date  . APPENDECTOMY    . CESAREAN SECTION    . COLONOSCOPY    . ESOPHAGOGASTRODUODENOSCOPY     Dr Dulce Sellar ? 2016.   Gerald Leitz GENERIC HISTORICAL  04/20/2016   IR PARACENTESIS 04/20/2016 Darrell K Allred, PA-C [facility]-INTERV RAD  . IR PARACENTESIS  03/23/2017  . IR PARACENTESIS  04/08/2017  . IR PARACENTESIS  07/18/2017  . IR PARACENTESIS   09/07/2017  . IR PARACENTESIS  09/14/2017  . IR PARACENTESIS  09/23/2017  . IR PARACENTESIS  10/06/2017  . IR PARACENTESIS  10/14/2017  . IR PARACENTESIS  10/21/2017    SOCIAL HISTORY:   Social History   Tobacco Use  . Smoking status: Current Every Day Smoker    Packs/day: 0.50    Types: Cigarettes  . Smokeless tobacco: Never Used  Substance Use Topics  . 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 Diabetes Mother   . Cancer Father   . Diabetes Sister   . Diabetes Brother     DRUG ALLERGIES:   Allergies  Allergen Reactions  . Tizanidine Other (See Comments)    Dizzy,falls    REVIEW OF SYSTEMS:   Review of Systems  Constitutional: Positive for malaise/fatigue. Negative for chills, diaphoresis, fever and weight loss.  HENT: Negative for congestion, ear pain, hearing loss, nosebleeds, sinus pain, sore throat and tinnitus.   Eyes: Negative for blurred vision, double vision and photophobia.  Respiratory: Negative for cough, hemoptysis, sputum production, shortness of breath and wheezing.   Cardiovascular: Negative for chest pain, palpitations, orthopnea, claudication, leg swelling and PND.  Gastrointestinal: Positive for abdominal pain and nausea. Negative for blood in stool, constipation, diarrhea, heartburn, melena and vomiting.  Genitourinary: Negative for dysuria, frequency, hematuria and urgency.  Musculoskeletal: Negative for back pain,  joint pain, myalgias and neck pain.  Skin: Negative for itching and rash.  Neurological: Positive for weakness. Negative for dizziness, tingling, tremors, sensory change, speech change, focal weakness, seizures, loss of consciousness and headaches.  Psychiatric/Behavioral: Negative for memory loss. The patient does not have insomnia.    MEDICATIONS AT HOME:   Prior to Admission medications   Medication Sig Start Date End Date Taking? Authorizing  Provider  albuterol (PROVENTIL) (2.5 MG/3ML) 0.083% nebulizer solution Take 3 mLs (2.5 mg total) by nebulization every 4 (four) hours as needed for wheezing or shortness of breath. Patient not taking: Reported on 06/29/2017 11/13/15   Standley Brooking, MD  ARIPiprazole (ABILIFY) 5 MG tablet Take 5 mg by mouth at bedtime.  04/02/15   [provider]  citalopram (CELEXA) 20 MG tablet TAKE 1 TABLET BY MOUTH EVERY NIGHT 12/20/16   [provider]  furosemide (LASIX) 40 MG tablet Take 40 mg by mouth daily.    [provider]  glyBURIDE (DIABETA) 2.5 MG tablet TAKE 1 TABLET BY MOUTH DAILY WITH BREAKFAST 02/12/17   [provider]  methocarbamol (ROBAXIN) 500 MG tablet Take 1 tablet (500 mg total) by mouth 2 (two) times daily. Patient not taking: Reported on 06/29/2017 11/09/16   Audry Pili, PA-C  Milk Thistle Extract 175 MG TABS Take 1 tablet by mouth daily.     [provider]  morphine (MSIR) 30 MG tablet Take 30 mg by mouth daily. 03/22/17   [provider]  Multiple Vitamins-Minerals (MULTIVITAMIN ADULTS) TABS Take 2 tablets by mouth daily.    [provider]  Nettle, Urtica Dioica, (NETTLE LEAF PO) Take 1 capsule by mouth 2 (two) times daily.    [provider]  ondansetron (ZOFRAN ODT) 4 MG disintegrating tablet Take 1 tablet (4 mg total) by mouth every 8 (eight) hours as needed for nausea or vomiting. 01/07/17   Dorena Bodo, NP  phenazopyridine (PYRIDIUM) 200 MG tablet Take 100 mg by mouth 3 (three) times daily as needed for pain.    [provider]  pioglitazone (ACTOS) 30 MG tablet Take 30 mg by mouth daily.    [provider]  spironolactone (ALDACTONE) 50 MG tablet Take 100 mg by mouth 2 (two) times daily.     [provider]  traZODone (DESYREL) 50 MG tablet Take 50 mg by mouth at bedtime.    [provider]  XIFAXAN 550 MG TABS tablet Take 1 tablet by mouth 2 (two) times daily. 06/10/17    [provider]      VITAL SIGNS:  Blood pressure 123/70, pulse 74, temperature 98.2 F (36.8 C), temperature source Oral, resp. rate 20, height 5\' 4"  (1.626 m), weight 87.5 kg (193 lb), last menstrual period 01/29/2013, SpO2 100 %.  PHYSICAL EXAMINATION:  Physical Exam  Constitutional: She is oriented to person, place, and time. She appears well-developed and well-nourished. She is active and cooperative.  Non-toxic appearance. She has a sickly appearance. She appears ill. She appears distressed (mild distress 2/2 abdominal distension/discomfort). She is not intubated.  HENT:  Head: Normocephalic and atraumatic.  Mouth/Throat: Oropharynx is clear and moist. No oropharyngeal exudate.  Eyes: Conjunctivae, EOM and lids are normal. No scleral icterus.  Cardiovascular: Normal rate, regular rhythm, S1 normal, S2 normal and normal heart sounds.  No extrasystoles are present. Exam reveals no gallop, no S3, no S4, no distant heart sounds and no friction rub.  No murmur heard. Pulmonary/Chest: Effort normal and breath sounds normal. No  accessory muscle usage or stridor. No apnea, no tachypnea and no bradypnea. She is not intubated. No respiratory distress. She has no decreased breath sounds. She has no wheezes. She has no rhonchi. She has no rales.  Abdominal: Bowel sounds are normal. She exhibits distension, fluid wave and ascites. There is generalized tenderness. There is rigidity, rebound and guarding.  Musculoskeletal: Normal range of motion. She exhibits no tenderness.  Neurological: She is alert and oriented to person, place, and time. She is not disoriented.  Skin: Skin is warm, dry and intact. No rash noted. She is not diaphoretic. No erythema.  Psychiatric: She has a normal mood and affect. Her speech is normal and behavior is normal. Judgment and thought content normal. Cognition and memory are normal.   LABORATORY PANEL:   CBC Recent Labs  Lab 10/27/17 0510  WBC 3.6  HGB  11.3*  HCT 33.3*  PLT 71*   ------------------------------------------------------------------------------------------------------------------  Chemistries  Recent Labs  Lab 10/27/17 0510  NA 126*  K 4.1  CL 95*  CO2 24  GLUCOSE 178*  BUN 17  CREATININE 0.79  CALCIUM 8.4*  AST 57*  ALT 26  ALKPHOS 195*  BILITOT 4.6*   ------------------------------------------------------------------------------------------------------------------  Cardiac Enzymes No results for input(s): TROPONINI in the last 168 hours. ------------------------------------------------------------------------------------------------------------------  RADIOLOGY:  Dg Chest Port 1 View  Result Date: 10/27/2017 CLINICAL DATA:  Abdominal pain and distension EXAM: PORTABLE CHEST 1 VIEW COMPARISON:  07/12/2016 FINDINGS: The heart size and mediastinal contours are within normal limits. Both lungs are clear. The visualized skeletal structures are unremarkable. IMPRESSION: No active disease. Electronically Signed   By: Deatra RobinsonKevin  Herman M.D.   On: 10/27/2017 06:05   IMPRESSION AND PLAN:   A/P: 7F EtOH cirrhosis/ESLD w/ recurrent ascites p/w ascites, T2NIDDM w/ hyperglycemia, hyponatremia, hypoalbuminemia, transaminasemia, hyperbilirubinemia, coagulopathy/elevated INR, normocytic anemia. -SIRS (-) -Do not suspect active SBP at present, but ordered for paracentesis fluid labs -Ordered for Levaquin in ED -BCx pending -Lactate surprisingly normal @ 1.4 (given liver disease) -IR consult for paracentesis -MELD score 26, 19.6% estimated 76mo mortality -Childs-Pugh Class C, 1-7962yr life expectancy, 82% abdominal surgery perioperative mortality -[facility] -Symptomatic mgmt, pain ctrl, antiemetics -Lasix, Aldactone, Rifaximin -SSI, hold PO antihyperglycemics -NPO for paracentesis -Lovenox -Full code -Observation, < 2 midnights   All the records are reviewed and case discussed with ED  provider. Management plans discussed with the patient, family and they are in agreement.  CODE STATUS: Full code  TOTAL TIME TAKING CARE OF THIS PATIENT: 90 minutes.    Barbaraann RondoPrasanna Bren Borys M.D on 10/27/2017 at 6:35 AM  Between 7am to 6pm - Pager - [phone]  After 6pm go to [email or web address] - Social research officer, governmentpassword [email or web address] [email or web address]  [facility] [place] [facility]  Office  [phone]  CC: Primary care physician; Piedad ClimesFulbright, [place] E, PA-C   Note: This dictation was prepared with Dragon dictation along with smaller phrase technology. Any transcriptional errors that result from this process are unintentional.

## 2017-10-27 NOTE — Progress Notes (Addendum)
During medication pass, patient questioned her Robaxin, that she didn't take it any more; she stated that the pharmacy came up and reviewed her medications with her after admission; re-Reviewed PTA list with patient at this time, suggested that she discuss her PTA list with the MD in the am and offered to have the pharmacy tech come up and review her medications with her again; told her that she didn't have to take any medications she didn't want to; stated that she would take it anyway. Windy Carinaurner,Marcella K, RN; 9:57 PM 10/27/2017    Medication list completed by pharmacy technician notes that patient is no longer taking Robaxin. An additional visit by pharmacy technician would not change anything at this point because pharmacy technicians do not have prescribing authority. Recommend re-reconcile by prescriber.  Deidrick Rainey A. Mildredookson, VermontPharm.D., BCPS Clinical Pharmacist 10/28/17 08:17

## 2017-10-27 NOTE — Progress Notes (Signed)
GI courtesy note  52 y/o female presents for hospital admission for progressive abdominal and leg swelling. 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 Patient had some abdominal pain now that is improved after large volume paracentesis.  No evidence of SBP. Patient apparently had been without her diuretics for a couple of weeks.  I will see that they are refilled. I suspect patient will be discharged tomorrow.  Unfortunately, the patient is not following a low Na diet and is eating [employer] chicken and not the hospital food. I admonished her for this, but this has been an ongoing problem.  I am happy to follow up with [name] after hospital discharge. If my services are required at any point, please do not hesitate to call. Thank you.   George Ina. Keith Mileidy Atkin, M.D. [phone] - Cell [facility]

## 2017-10-27 NOTE — ED Provider Notes (Signed)
[facility] Emergency Department Provider Note   ____________________________________________   First MD Initiated Contact with Patient 10/27/17 [phone]     (approximate)  I have reviewed the triage vital signs and the nursing notes.   HISTORY  Chief Complaint Abdominal Pain    HPI Katrina Cohen is a 52 y.o. female who presents to the ED from home with a chief complaint of abdominal pain.  Patient has a history of cirrhosis, requiring paracentesis every 2 weeks.  Reports she had a paracentesis done on 5/31, less than 1 week ago.  Complains of increased abdominal pain and fluid.  States her belly is bigger than her pre-paracentesis date.  Reports nausea without vomiting.  Denies fever, chills, chest pain, shortness of breath, vomiting, dysuria, diarrhea.  Denies recent travel or trauma.   Past Medical History:  Diagnosis Date  . Bipolar 2 disorder (HCC)   . Bipolar disorder (HCC) 1980s   with depression, anxiety  . Cirrhosis (HCC)   . Depression   . Depression with anxiety 1980s   [facility] admission 05/2011.    . Diabetes mellitus   . Dysphagia 11/2014   normal esophagram 11/2014. EGD per Dr Dulce Sellar.   . Hepatitis   . Hypertension   . Jaundice 04/2015   In setting of infectious mononucleosis. Ultrasound in [place]: hepatosplenomegaly, portal htn, possible cirrhosis  . Liver disease   . Mononucleosis 04/15/2015    Patient Active Problem List   Diagnosis Date Noted  . Cirrhosis of liver with ascites (HCC) 10/27/2017  . Encephalopathy, hepatic (HCC)   . Acute encephalopathy 01/18/2017  . Right upper quadrant abdominal pain 04/02/2016  . UTI (urinary tract infection) 04/02/2016  . Cholelithiasis 04/02/2016  . Depression with anxiety 04/02/2016  . Chronic pain 04/02/2016  . SOB (shortness of breath) 11/12/2015  . COPD exacerbation (HCC) 11/12/2015  . Acute respiratory failure with hypoxia (HCC) 11/12/2015  . CAP (community acquired pneumonia)  11/07/2015  . Cirrhosis (HCC) 11/07/2015  . Thrombocytopenia (HCC) 11/07/2015  . Alcohol abuse 04/30/2015  . Acute hepatitis 04/30/2015  . Tobacco abuse 04/30/2015  . Malnutrition of moderate degree 04/29/2015  . Hypokalemia 04/29/2015  . Anemia 04/29/2015  . Hyponatremia 04/29/2015  . Coagulopathy (HCC) 04/29/2015  . Hypoalbuminemia 04/29/2015  . Mononucleosis 04/28/2015  . Vitamin D deficiency 06/11/2011  . Major depression (HCC) 06/10/2011    Past Surgical History:  Procedure Laterality Date  . APPENDECTOMY    . CESAREAN SECTION    . COLONOSCOPY    . ESOPHAGOGASTRODUODENOSCOPY     Dr Dulce Sellar ? 2016.   Gerald Leitz GENERIC HISTORICAL  04/20/2016   IR PARACENTESIS 04/20/2016 Darrell K Allred, PA-C [facility]INTER[facility]  . IR PARACENTESIS  03/23/2017  . IR PARACENTESIS  04/08/2017  . IR PARACENTESIS  07/18/2017  . IR PARACENTESIS  09/07/2017  . IR PARACENTESIS  09/14/2017  . IR PARACENTESIS  09/23/2017  . IR PARACENTESIS  10/06/2017  . IR PARACENTESIS  10/14/2017  . IR PARACENTESIS  10/21/2017    Prior to Admission medications   Medication Sig Start Date End Date Taking? Authorizing Provider  albuterol (PROVENTIL) (2.5 MG/3ML) 0.083% nebulizer solution Take 3 mLs (2.5 mg total) by nebulization every 4 (four) hours as needed for wheezing or shortness of breath. Patient not taking: Reported on 06/29/2017 11/13/15   Standley Brooking, MD  ARIPiprazole (ABILIFY) 5 MG tablet Take 5 mg by mouth at bedtime.  04/02/15   [provider]  citalopram (CELEXA) 20 MG tablet  TAKE 1 TABLET BY MOUTH EVERY NIGHT 12/20/16   [provider]  furosemide (LASIX) 40 MG tablet Take 40 mg by mouth daily.    [provider]  glyBURIDE (DIABETA) 2.5 MG tablet TAKE 1 TABLET BY MOUTH DAILY WITH BREAKFAST 02/12/17   [provider]  methocarbamol (ROBAXIN) 500 MG tablet Take 1 tablet (500 mg total) by mouth 2 (two) times daily. Patient not taking: Reported on 06/29/2017 11/09/16   Audry PiliMohr, Tyler,  PA-C  Milk Thistle Extract 175 MG TABS Take 1 tablet by mouth daily.     [provider]  morphine (MSIR) 30 MG tablet Take 30 mg by mouth daily. 03/22/17   [provider]  Multiple Vitamins-Minerals (MULTIVITAMIN ADULTS) TABS Take 2 tablets by mouth daily.    [provider]  Nettle, Urtica Dioica, (NETTLE LEAF PO) Take 1 capsule by mouth 2 (two) times daily.    [provider]  ondansetron (ZOFRAN ODT) 4 MG disintegrating tablet Take 1 tablet (4 mg total) by mouth every 8 (eight) hours as needed for nausea or vomiting. 01/07/17   Dorena BodoKennard, Lawrence, NP  phenazopyridine (PYRIDIUM) 200 MG tablet Take 100 mg by mouth 3 (three) times daily as needed for pain.    [provider]  pioglitazone (ACTOS) 30 MG tablet Take 30 mg by mouth daily.    [provider]  spironolactone (ALDACTONE) 50 MG tablet Take 100 mg by mouth 2 (two) times daily.     [provider]  traZODone (DESYREL) 50 MG tablet Take 50 mg by mouth at bedtime.    [provider]  XIFAXAN 550 MG TABS tablet Take 1 tablet by mouth 2 (two) times daily. 06/10/17   [provider]    Allergies Tizanidine  Family History  Problem Relation Age of Onset  . Diabetes Mother   . Cancer Father   . Diabetes Sister   . Diabetes Brother     Social History Social History   Tobacco Use  . Smoking status: Current Every Day Smoker    Packs/day: 0.50    Types: Cigarettes  . Smokeless tobacco: Never Used  Substance Use Topics  . Alcohol use: Not Currently    Comment: sober for over 1 year  . Drug use: No    Review of Systems  Constitutional: No fever/chills Eyes: No visual changes. ENT: No sore throat. Cardiovascular: Denies chest pain. Respiratory: Denies shortness of breath. Gastrointestinal: Positive for abdominal pain and swelling.  Positive for nausea, no vomiting.  No diarrhea.  No constipation. Genitourinary: Negative for  dysuria. Musculoskeletal: Negative for back pain. Skin: Negative for rash. Neurological: Negative for headaches, focal weakness or numbness.   ____________________________________________   PHYSICAL EXAM:  VITAL SIGNS: ED Triage Vitals  Enc Vitals Group     BP 10/27/17 0446 (!) 119/54     Pulse Rate 10/27/17 0446 76     Resp 10/27/17 0446 20     Temp 10/27/17 0446 98.2 F (36.8 C)     Temp Source 10/27/17 0446 Oral     SpO2 10/27/17 0446 100 %     Weight 10/27/17 0447 193 lb (87.5 kg)     Height 10/27/17 0447 5\' 4"  (1.626 m)     Head Circumference --      Peak Flow --      Pain Score 10/27/17 0447 8     Pain Loc --      Pain Edu? --      Excl. in GC? --  Constitutional: Alert and oriented.  Chronically ill appearing and in mild acute distress. Eyes: Scleral icterus. PERRL. EOMI. Head: Atraumatic. Nose: No congestion/rhinnorhea. Mouth/Throat: Mucous membranes are moist.  Oropharynx non-erythematous. Neck: No stridor.   Cardiovascular: Normal rate, regular rhythm. Grossly normal heart sounds.  Good peripheral circulation. Respiratory: Normal respiratory effort.  No retractions. Lungs CTAB. Gastrointestinal: Moderate distention.  Hepatomegaly.  Diffusely tender to palpation.  Ascites.  Overlying warmth and erythema.  No abdominal bruits. No CVA tenderness. Musculoskeletal: No lower extremity tenderness. 2+ BLE pitting edema despite wearing compression stockings.  No joint effusions. Neurologic:  Normal speech and language. No gross focal neurologic deficits are appreciated.  Skin:  Skin is warm, dry and intact. No rash noted. Psychiatric: Mood and affect are normal. Speech and behavior are normal.  ____________________________________________   LABS (all labs ordered are listed, but only abnormal results are displayed)  Labs Reviewed  COMPREHENSIVE METABOLIC PANEL - Abnormal; Notable for the following components:      Result Value   Sodium 126 (*)    Chloride 95  (*)    Glucose, Bld 178 (*)    Calcium 8.4 (*)    Total Protein 5.6 (*)    Albumin 2.9 (*)    AST 57 (*)    Alkaline Phosphatase 195 (*)    Total Bilirubin 4.6 (*)    All other components within normal limits  CBC - Abnormal; Notable for the following components:   RBC 3.67 (*)    Hemoglobin 11.3 (*)    HCT 33.3 (*)    RDW 18.0 (*)    Platelets 71 (*)    All other components within normal limits  URINALYSIS, COMPLETE (UACMP) WITH MICROSCOPIC - Abnormal; Notable for the following components:   Color, Urine AMBER (*)    APPearance CLOUDY (*)    Leukocytes, UA LARGE (*)    WBC, UA >50 (*)    Bacteria, UA FEW (*)    Non Squamous Epithelial 0-5 (*)    All other components within normal limits  AMMONIA - Abnormal; Notable for the following components:   Ammonia 38 (*)    All other components within normal limits  LIPASE, BLOOD - Abnormal; Notable for the following components:   Lipase 52 (*)    All other components within normal limits  PROTIME-INR - Abnormal; Notable for the following components:   Prothrombin Time 19.1 (*)    All other components within normal limits  CULTURE, BLOOD (ROUTINE X 2)  CULTURE, BLOOD (ROUTINE X 2)  LACTIC ACID, PLASMA  LACTIC ACID, PLASMA   ____________________________________________  EKG  None ____________________________________________  RADIOLOGY  ED MD interpretation: No acute cardiopulmonary process  Official radiology report(s): Dg Chest Port 1 View  Result Date: 10/27/2017 CLINICAL DATA:  Abdominal pain and distension EXAM: PORTABLE CHEST 1 VIEW COMPARISON:  07/12/2016 FINDINGS: The heart size and mediastinal contours are within normal limits. Both lungs are clear. The visualized skeletal structures are unremarkable. IMPRESSION: No active disease. Electronically Signed   By: Deatra Robinson M.D.   On: 10/27/2017 06:05    ____________________________________________   PROCEDURES  Procedure(s) performed:  None  Procedures  Critical Care performed: Yes, see critical care note(s)   CRITICAL CARE Performed by: Irean Hong   Total critical care time: 30 minutes  Critical care time was exclusive of separately billable procedures and treating other patients.  Critical care was necessary to treat or prevent imminent or life-threatening deterioration.  Critical care was time spent personally by me  on the following activities: development of treatment plan with patient and/or surrogate as well as nursing, discussions with consultants, evaluation of patient's response to treatment, examination of patient, obtaining history from patient or surrogate, ordering and performing treatments and interventions, ordering and review of laboratory studies, ordering and review of radiographic studies, pulse oximetry and re-evaluation of patient's condition.  ____________________________________________   INITIAL IMPRESSION / ASSESSMENT AND PLAN / ED COURSE  As part of my medical decision making, I reviewed the following data within the electronic MEDICAL RECORD NUMBER History obtained from family, Nursing notes reviewed and incorporated, Labs reviewed, Old chart reviewed, Radiograph reviewed, Discussed with admitting physician and Notes from prior ED visits   52 year old female with cirrhosis status post paracentesis less than 1 week ago who presents with abdominal pain, nausea, distention secondary to ascites.  Differential diagnosis includes but is not limited to ascites secondary to cirrhosis, SBP, cholecystitis, gastroenteritis, etc.   I personally reviewed paracentesis summary from 10/21/2017 which was done under interventional radiology.  Patient yielded 5.8 L of cloudy fluid.  Review results in the computer which is concerning for SBP.  Will obtain blood cultures, lactate in addition to blood already drawn.  Will initiate 750 mg IV Levaquin.  Anticipate hospitalization.   Clinical Course as of Oct 27 613   Thu Oct 27, 2017  0559 Updated patient and her family on laboratory and urinalysis results.  Looking back at her records, hyponatremia and liver enzymes including bilirubin are stable.  Discussed with hospitalist to evaluate patient in the emergency department for admission.   [JS]    Clinical Course User Index [JS] Irean Hong, MD     ____________________________________________   FINAL CLINICAL IMPRESSION(S) / ED DIAGNOSES  Final diagnoses:  Generalized abdominal pain  Cirrhosis of liver with ascites, unspecified hepatic cirrhosis type (HCC)  Urinary tract infection without hematuria, site unspecified  SBP (spontaneous bacterial peritonitis) (HCC)  Hyponatremia     ED Discharge Orders    None       Note:  This document was prepared using Dragon voice recognition software and may include unintentional dictation errors.    Irean Hong, MD 10/27/17 [phone]

## 2017-10-28 LAB — BLOOD CULTURE ID PANEL (REFLEXED)
Acinetobacter baumannii: NOT DETECTED
CANDIDA GLABRATA: NOT DETECTED
CANDIDA KRUSEI: NOT DETECTED
CANDIDA PARAPSILOSIS: NOT DETECTED
Candida albicans: NOT DETECTED
Candida tropicalis: NOT DETECTED
ENTEROBACTER CLOACAE COMPLEX: NOT DETECTED
ENTEROCOCCUS SPECIES: NOT DETECTED
ESCHERICHIA COLI: NOT DETECTED
Enterobacteriaceae species: NOT DETECTED
Haemophilus influenzae: NOT DETECTED
KLEBSIELLA OXYTOCA: NOT DETECTED
KLEBSIELLA PNEUMONIAE: NOT DETECTED
Listeria monocytogenes: NOT DETECTED
Neisseria meningitidis: NOT DETECTED
PROTEUS SPECIES: NOT DETECTED
PSEUDOMONAS AERUGINOSA: NOT DETECTED
STAPHYLOCOCCUS SPECIES: NOT DETECTED
Serratia marcescens: NOT DETECTED
Staphylococcus aureus (BCID): NOT DETECTED
Streptococcus agalactiae: NOT DETECTED
Streptococcus pneumoniae: NOT DETECTED
Streptococcus pyogenes: NOT DETECTED
Streptococcus species: NOT DETECTED

## 2017-10-28 LAB — BASIC METABOLIC PANEL
Anion gap: 6 (ref 5–15)
BUN: 19 mg/dL (ref 6–20)
CALCIUM: 8.2 mg/dL — AB (ref 8.9–10.3)
CO2: 25 mmol/L (ref 22–32)
Chloride: 97 mmol/L — ABNORMAL LOW (ref 101–111)
Creatinine, Ser: 1.13 mg/dL — ABNORMAL HIGH (ref 0.44–1.00)
GFR calc Af Amer: >60 mL/min (ref >60)
GFR, EST NON AFRICAN AMERICAN: 55 mL/min — AB (ref >60)
GLUCOSE: 182 mg/dL — AB (ref 65–99)
Potassium: 4.1 mmol/L (ref 3.5–5.1)
Sodium: 128 mmol/L — ABNORMAL LOW (ref 135–145)

## 2017-10-28 LAB — GLUCOSE, CAPILLARY: GLUCOSE-CAPILLARY: 177 mg/dL — AB (ref 65–99)

## 2017-10-28 LAB — CBC
HCT: 26.5 % — ABNORMAL LOW (ref 35.0–47.0)
Hemoglobin: 9.2 g/dL — ABNORMAL LOW (ref 12.0–16.0)
MCH: 31.2 pg (ref 26.0–34.0)
MCHC: 34.8 g/dL (ref 32.0–36.0)
MCV: 89.7 fL (ref 80.0–100.0)
Platelets: 51 10*3/uL — ABNORMAL LOW (ref 150–440)
RBC: 2.95 MIL/uL — ABNORMAL LOW (ref 3.80–5.20)
RDW: 17.6 % — ABNORMAL HIGH (ref 11.5–14.5)
WBC: 2 10*3/uL — ABNORMAL LOW (ref 3.6–11.0)

## 2017-10-28 LAB — CYTOLOGY - NON PAP

## 2017-10-28 LAB — PROTEIN, BODY FLUID (OTHER): TOTAL PROTEIN, BODY FLUID OTHER: 0.5 g/dL

## 2017-10-28 MED ORDER — SPIRONOLACTONE 50 MG PO TABS: 100.0000 mg | ORAL_TABLET | Freq: Two times a day (BID) | ORAL | 2 refills | Status: DC

## 2017-10-28 MED ORDER — FUROSEMIDE 40 MG PO TABS: 40.0000 mg | ORAL_TABLET | Freq: Two times a day (BID) | ORAL | 2 refills | Status: DC

## 2017-10-28 NOTE — Progress Notes (Signed)
Advanced care plan.  Purpose of the Encounter: CODE STATUS  Parties in Attendance: Patient herself  Patient's Decision Capacity: Intact  Subjective/Patient's story: Katrina Cohen  is a 52 y.o. female with a known history of EtOH cirrhosis/ESLD. Patient with overall poor prognosis.     Objective/Medical story I discussed with the patient regarding her overall prognosis.  Discussed CODE STATUS.  Explained her about DO NOT RESUSCITATE and intubation.  Patient states that she would like everything done   Goals of care determination: Full code    CODE STATUS: Full code   Time spent discussing advanced care planning: 16 minutes

## 2017-10-28 NOTE — Progress Notes (Signed)
PHARMACY - PHYSICIAN COMMUNICATION CRITICAL VALUE ALERT - BLOOD CULTURE IDENTIFICATION (BCID)  Results for orders placed or performed during the hospital encounter of 10/27/17  Blood Culture ID Panel (Reflexed) (Collected: 10/27/2017  5:29 AM)  Result Value Ref Range   Enterococcus species NOT DETECTED NOT DETECTED   Listeria monocytogenes NOT DETECTED NOT DETECTED   Staphylococcus species NOT DETECTED NOT DETECTED   Staphylococcus aureus NOT DETECTED NOT DETECTED   Streptococcus species NOT DETECTED NOT DETECTED   Streptococcus agalactiae NOT DETECTED NOT DETECTED   Streptococcus pneumoniae NOT DETECTED NOT DETECTED   Streptococcus pyogenes NOT DETECTED NOT DETECTED   Acinetobacter baumannii NOT DETECTED NOT DETECTED   Enterobacteriaceae species NOT DETECTED NOT DETECTED   Enterobacter cloacae complex NOT DETECTED NOT DETECTED   Escherichia coli NOT DETECTED NOT DETECTED   Klebsiella oxytoca NOT DETECTED NOT DETECTED   Klebsiella pneumoniae NOT DETECTED NOT DETECTED   Proteus species NOT DETECTED NOT DETECTED   Serratia marcescens NOT DETECTED NOT DETECTED   Haemophilus influenzae NOT DETECTED NOT DETECTED   Neisseria meningitidis NOT DETECTED NOT DETECTED   Pseudomonas aeruginosa NOT DETECTED NOT DETECTED   Candida albicans NOT DETECTED NOT DETECTED   Candida glabrata NOT DETECTED NOT DETECTED   Candida krusei NOT DETECTED NOT DETECTED   Candida parapsilosis NOT DETECTED NOT DETECTED   Candida tropicalis NOT DETECTED NOT DETECTED   Lab reports GPC in 1 of 4 bottles. Afebrile, but hypotensive after paracentesis. Patient received 75 g albumin yesterday. WBC 2.   Name of physician (or Provider) Contacted: Shreyang Patel  Changes to prescribed antibiotics required: None at this time, Dr. Allena KatzPatel says likely contaminant.   Carola FrostNathan A Arbie Blankley, Pharm.D., BCPS Clinical Pharmacist 10/28/2017  8:14 AM

## 2017-10-28 NOTE — Progress Notes (Signed)
Patient discharge teaching given, including activity, diet, follow-up appoints, and medications. Patient verbalized understanding of all discharge instructions. IV access was d/c'd. Vitals are stable. Skin is intact except as charted in most recent assessments. Pt to be escorted out by volunteer, to be driven home by family.  Katrina Cohen  

## 2017-10-28 NOTE — Discharge Summary (Signed)
[facility] - [place] at [facility], [place] y.o., DOB 12-Feb-1966, MRN [id number]. Admission date: 10/27/2017 Discharge Date 10/28/2017 Primary MD Burnis Medin, PA-C Admitting Physician Barbaraann Rondo, MD  Admission Diagnosis  Hyponatremia [E87.1] Generalized abdominal pain [R10.84] SBP (spontaneous bacterial peritonitis) (HCC) [K65.2] Cirrhosis of liver with ascites (HCC) [K74.60, R18.8] Cirrhosis of liver with ascites, unspecified hepatic cirrhosis type (HCC) [K74.60, R18.8] Urinary tract infection without hematuria, site unspecified [N39.0]  Discharge Diagnosis   Active Problems: Abdominal pain due to worsening ascites Medical noncompliance  liver cirrhosis with meld score of 26, childs pugh class c anasara hyponatremia     Hospital Course  Patient is 52 year old with history of alcohol abuse with cirrhosis and history of recurrent ascites requiring outpatient paracentesis presented with abdominal distention worsening and pain.  Was to have significant swelling of the lower extremity.  Patient was admitted and underwent paracentesis with 4 L removal.  Patient symptoms improved significantly.  She was seen in consultation by GI who also follows her as outpatient stated that patient was not taking her diuretics.  I have strongly encouraged patient to take her diuretics and be compliant with her medications.           Consults  GI  Significant Tests:  See full reports for all details     US Paracentesis  Result Date: 10/27/2017 INDICATION: Cirrhosis, ascites, abdominal distension EXAM: ULTRASOUND GUIDED RIGHT PARACENTESIS MEDICATIONS: 1% LIDOCAINE LOCAL COMPLICATIONS: None immediate. PROCEDURE: Informed written consent was obtained from the patient after a discussion of the risks, benefits and alternatives to treatment. A timeout was performed prior to the initiation of the procedure. Initial ultrasound scanning demonstrates a large  amount of ascites within the right lower abdominal quadrant. The right lower abdomen was prepped and draped in the usual sterile fashion. 1% lidocaine with epinephrine was used for local anesthesia. Following this, a 6 Fr Safe-T-Centesis catheter was introduced. An ultrasound image was saved for documentation purposes. The paracentesis was performed. The catheter was removed and a dressing was applied. The patient tolerated the procedure well without immediate post procedural complication. FINDINGS: A total of approximately 4.8 L of clear peritoneal fluid was removed. Samples were sent to the laboratory as requested by the clinical team. IMPRESSION: Successful ultrasound-guided paracentesis yielding 4.8 liters of peritoneal fluid. Electronically Signed   By: Judie Petit.  Shick M.D.   On: 10/27/2017 10:00   Dg Chest Port 1 View  Result Date: 10/27/2017 CLINICAL DATA:  Abdominal pain and distension EXAM: PORTABLE CHEST 1 VIEW COMPARISON:  07/12/2016 FINDINGS: The heart size and mediastinal contours are within normal limits. Both lungs are clear. The visualized skeletal structures are unremarkable. IMPRESSION: No active disease. Electronically Signed   By: Deatra Robinson M.D.   On: 10/27/2017 06:05   Ir Paracentesis  Result Date: 10/21/2017 INDICATION: Patient with recurrent ascites. Request is made for diagnostic and therapeutic paracentesis. EXAM: ULTRASOUND GUIDED DIAGNOSTIC AND THERAPEUTIC PARACENTESIS MEDICATIONS: 10 mL 2% lidocaine COMPLICATIONS: None immediate. PROCEDURE: Informed written consent was obtained from the patient after a discussion of the risks, benefits and alternatives to treatment. A timeout was performed prior to the initiation of the procedure. Initial ultrasound scanning demonstrates a large amount of ascites within the left lateral abdomen. The left lateral abdomen was prepped and draped in the usual sterile fashion. 2% lidocaine was used for local anesthesia. Following this, a 19 gauge, 7-cm,  Yueh catheter was introduced. An ultrasound image was saved for documentation purposes. The paracentesis  was performed. The catheter was removed and a dressing was applied. The patient tolerated the procedure well without immediate post procedural complication. FINDINGS: A total of approximately 5.8 liters of cloudy, pink fluid was removed. Samples were sent to the laboratory as requested by the clinical team. IMPRESSION: Successful ultrasound-guided diagnostic and therapeutic paracentesis yielding 5.8 liters of peritoneal fluid. Read by: Loyce Dys PA-C Electronically Signed   By: Malachy Moan M.D.   On: 10/21/2017 15:57   Ir Paracentesis  Result Date: 10/14/2017 INDICATION: History of hepatitis and cirrhosis. Recurrent ascites. Request for diagnostic and therapeutic paracentesis. EXAM: ULTRASOUND GUIDED LEFT LOWER QUADRANT PARACENTESIS MEDICATIONS: None. COMPLICATIONS: None immediate. PROCEDURE: Informed written consent was obtained from the patient after a discussion of the risks, benefits and alternatives to treatment. A timeout was performed prior to the initiation of the procedure. Initial ultrasound scanning demonstrates a large amount of ascites within the left lower abdominal quadrant. The left lower abdomen was prepped and draped in the usual sterile fashion. 1% lidocaine with epinephrine was used for local anesthesia. Following this, a 19 gauge, 7-cm, Yueh catheter was introduced. An ultrasound image was saved for documentation purposes. The paracentesis was performed. The catheter was removed and a dressing was applied. The patient tolerated the procedure well without immediate post procedural complication. FINDINGS: A total of approximately 5.2 L of hazy, amber colored fluid was removed. Samples were sent to the laboratory as requested by the clinical team. IMPRESSION: Successful ultrasound-guided paracentesis yielding 5.2 liters of peritoneal fluid. Read by: Brayton El PA-C  Electronically Signed   By: Malachy Moan M.D.   On: 10/14/2017 10:53   Ir Paracentesis  Result Date: 10/06/2017 INDICATION: Abdominal distention secondary to recurrent ascites. Request for diagnostic and therapeutic paracentesis. EXAM: ULTRASOUND GUIDED LEFT LOWER QUADRANT PARACENTESIS MEDICATIONS: None. COMPLICATIONS: None immediate. PROCEDURE: Informed written consent was obtained from the patient after a discussion of the risks, benefits and alternatives to treatment. A timeout was performed prior to the initiation of the procedure. Initial ultrasound scanning demonstrates a large amount of ascites within the left lower abdominal quadrant. The left lower abdomen was prepped and draped in the usual sterile fashion. 1% lidocaine with epinephrine was used for local anesthesia. Following this, a 19 gauge, 7-cm, Yueh catheter was introduced. An ultrasound image was saved for documentation purposes. The paracentesis was performed. The catheter was removed and a dressing was applied. The patient tolerated the procedure well without immediate post procedural complication. FINDINGS: A total of approximately 6 L of hazy, amber colored fluid was removed. Samples were sent to the laboratory as requested by the clinical team. IMPRESSION: Successful ultrasound-guided paracentesis yielding 6 liters of peritoneal fluid. Read by: Brayton El PA-C Electronically Signed   By: Simonne Come M.D.   On: 10/06/2017 11:55       Today   Subjective:   Jory Ee feeling well wants to go home  Objective:   Blood pressure (!) 98/54, pulse 97, temperature 98.6 F (37 C), temperature source Oral, resp. rate 20, height 5\' 4"  (1.626 m), weight 87.5 kg (193 lb), last menstrual period 01/29/2013, SpO2 95 %.  .  Intake/Output Summary (Last 24 hours) at 10/28/2017 1217 Last data filed at 10/28/2017 0604 Gross per 24 hour  Intake 863 ml  Output 2400 ml  Net -1537 ml    Exam VITAL SIGNS: Blood pressure (!) 98/54, pulse  97, temperature 98.6 F (37 C), temperature source Oral, resp. rate 20, height 5\' 4"  (1.626 m), weight 87.5 kg (  193 lb), last menstrual period 01/29/2013, SpO2 95 %.  GENERAL:  52 y.o.-year-old patient lying in the bed with no acute distress.  EYES: Pupils equal, round, reactive to light and accommodation. No scleral icterus. Extraocular muscles intact.  HEENT: Head atraumatic, normocephalic. Oropharynx and nasopharynx clear.  NECK:  Supple, no jugular venous distention. No thyroid enlargement, no tenderness.  LUNGS: Normal breath sounds bilaterally, no wheezing, rales,rhonchi or crepitation. No use of accessory muscles of respiration.  CARDIOVASCULAR: S1, S2 normal. No murmurs, rubs, or gallops.  ABDOMEN: Soft, positive distention no guarding rebound EXTREMITIES: No pedal edema, cyanosis, or clubbing.  NEUROLOGIC: Cranial nerves II through XII are intact. Muscle strength 5/5 in all extremities. Sensation intact. Gait not checked.  PSYCHIATRIC: The patient is alert and oriented x 3.  SKIN: No obvious rash, lesion, or ulcer.   Data Review     CBC w Diff:  Lab Results  Component Value Date   WBC 2.0 (L) 10/28/2017   HGB 9.2 (L) 10/28/2017   HCT 26.5 (L) 10/28/2017   PLT 51 (L) 10/28/2017   LYMPHOPCT 13 06/29/2017   MONOPCT 9 06/29/2017   EOSPCT 1 06/29/2017   BASOPCT 0 06/29/2017   CMP:  Lab Results  Component Value Date   NA 128 (L) 10/28/2017   K 4.1 10/28/2017   CL 97 (L) 10/28/2017   CO2 25 10/28/2017   BUN 19 10/28/2017   CREATININE 1.13 (H) 10/28/2017   PROT 5.6 (L) 10/27/2017   ALBUMIN 2.9 (L) 10/27/2017   BILITOT 4.6 (H) 10/27/2017   ALKPHOS 195 (H) 10/27/2017   AST 57 (H) 10/27/2017   ALT 26 10/27/2017  .  Micro Results Recent Results (from the past 240 hour(s))  Culture, blood (routine x 2)     Status: None (Preliminary result)   Collection Time: 10/27/17  5:29 AM  Result Value Ref Range Status   Specimen Description   Final    BLOOD LEFT AC Performed at  [facility], [address], [place], [place] [address]    Special Requests   Final    BOTTLES DRAWN AEROBIC AND ANAEROBIC Blood Culture adequate volume Performed at [facility], [address], [place], [place] [address]    Culture  Setup Time   Final    GRAM POSITIVE COCCI ANAEROBIC BOTTLE ONLY CRITICAL RESULT CALLED TO, READ BACK BY AND VERIFIED WITH: [facility] ZOMPA AT 0981 ON 10/28/2017 JJB Performed at [facility], [address], [place], [place] [address]    Culture GRAM POSITIVE COCCI  Final   Report Status PENDING  Incomplete  Culture, blood (routine x 2)     Status: None (Preliminary result)   Collection Time: 10/27/17  5:29 AM  Result Value Ref Range Status   Specimen Description BLOOD RIGHT AC  Final   Special Requests   Final    BOTTLES DRAWN AEROBIC AND ANAEROBIC Blood Culture results may not be optimal due to an excessive volume of blood received in culture bottles   Culture   Final    NO GROWTH 1 DAY Performed at [facility], [address] [place], [place] [address]    Report Status PENDING  Incomplete  Blood Culture ID Panel (Reflexed)     Status: None   Collection Time: 10/27/17  5:29 AM  Result Value Ref Range Status   Enterococcus species NOT DETECTED NOT DETECTED Final   Listeria monocytogenes NOT DETECTED NOT DETECTED Final   Staphylococcus species NOT DETECTED NOT DETECTED  Final   Staphylococcus aureus NOT DETECTED NOT DETECTED Final   Streptococcus species NOT DETECTED NOT DETECTED Final   Streptococcus agalactiae NOT DETECTED NOT DETECTED Final   Streptococcus pneumoniae NOT DETECTED NOT DETECTED Final   Streptococcus pyogenes NOT DETECTED NOT DETECTED Final   Acinetobacter baumannii NOT DETECTED NOT DETECTED Final   Enterobacteriaceae species NOT DETECTED NOT DETECTED Final   Enterobacter cloacae complex NOT DETECTED NOT DETECTED Final   Escherichia coli NOT DETECTED NOT DETECTED Final   Klebsiella oxytoca NOT  DETECTED NOT DETECTED Final   Klebsiella pneumoniae NOT DETECTED NOT DETECTED Final   Proteus species NOT DETECTED NOT DETECTED Final   Serratia marcescens NOT DETECTED NOT DETECTED Final   Haemophilus influenzae NOT DETECTED NOT DETECTED Final   Neisseria meningitidis NOT DETECTED NOT DETECTED Final   Pseudomonas aeruginosa NOT DETECTED NOT DETECTED Final   Candida albicans NOT DETECTED NOT DETECTED Final   Candida glabrata NOT DETECTED NOT DETECTED Final   Candida krusei NOT DETECTED NOT DETECTED Final   Candida parapsilosis NOT DETECTED NOT DETECTED Final   Candida tropicalis NOT DETECTED NOT DETECTED Final    Comment: Performed at [facility], [address] [place], [place] [address]  Body fluid culture     Status: None (Preliminary result)   Collection Time: 10/27/17  9:20 AM  Result Value Ref Range Status   Specimen Description   Final    PERITONEAL Performed at [facility] [address], [place], [place] [address]    Special Requests   Final    NONE Performed at [facility], [address] [place], [place] [address]    Gram Stain   Final    RARE WBC PRESENT, PREDOMINANTLY MONONUCLEAR NO ORGANISMS SEEN    Culture   Final    NO GROWTH < 24 HOURS Performed at [facility], [address], [place], [place] [address]    Report Status PENDING  Incomplete        Code Status Orders  (From admission, onward)        Start     Ordered   10/27/17 0821  Full code  Continuous     10/27/17 0820    Code Status History    Date Active Date Inactive Code Status Order ID Comments User Context   01/18/2017 2156 01/21/2017 1735 Full Code [id number]  Eduard Clos, MD ED   04/02/2016 0506 04/03/2016 1524 Full Code [id number]  Briscoe Deutscher, MD ED   11/12/2015 1753 11/13/2015 1502 Full Code [id number]  Standley Brooking, MD ED   11/07/2015 0637 11/08/2015 1423 Full Code [id number]  Michael Litter, MD Inpatient   04/28/2015 2111 04/30/2015  2323 Full Code [id number]  Eston Esters, MD ED   06/07/2011 1321 06/08/2011 2157 Full Code [id number]  Dione Booze, MD ED   05/27/2011 0912 05/27/2011 1811 Full Code [id number]  Remi Haggard, NP ED          Follow-up Information    [place], [place], PA-C. Go on 11/08/2017.   Specialty:  Family Medicine Why:  @9 :40a  hsop f/u Contact information: [address] [facility] [place] [address] [phone]        Stanton Kidney, MD In 2 weeks.   Specialty:  Gastroenterology Why:  hosp f/u liver cirrosisi Contact information: [address] [place] [address] [phone]           Discharge Medications   Allergies as of  10/28/2017      Reactions   Tizanidine Other (See Comments)   Dizzy,falls      Medication List    STOP taking these medications   methocarbamol 500 MG tablet Commonly known as:  ROBAXIN     TAKE these medications   albuterol (2.5 MG/3ML) 0.083% nebulizer solution Commonly known as:  PROVENTIL Take 3 mLs (2.5 mg total) by nebulization every 4 (four) hours as needed for wheezing or shortness of breath.   ARIPiprazole 5 MG tablet Commonly known as:  ABILIFY Take 5 mg by mouth at bedtime.   citalopram 20 MG tablet Commonly known as:  CELEXA TAKE 1 TABLET BY MOUTH EVERY NIGHT   furosemide 40 MG tablet Commonly known as:  LASIX Take 1 tablet (40 mg total) by mouth 2 (two) times daily. What changed:  when to take this   Milk Thistle Extract 175 MG Tabs Take 1 tablet by mouth daily.   morphine 30 MG 12 hr tablet Commonly known as:  MS CONTIN Take 30 mg by mouth daily.   MULTIVITAMIN ADULTS Tabs Take 2 tablets by mouth daily.   NETTLE LEAF PO Take 1 capsule by mouth 2 (two) times daily.   ondansetron 4 MG disintegrating tablet Commonly known as:  ZOFRAN ODT Take 1 tablet (4 mg total) by mouth every 8 (eight) hours as needed for nausea or vomiting.   pioglitazone 30 MG tablet Commonly known as:   ACTOS Take 30 mg by mouth daily.   spironolactone 50 MG tablet Commonly known as:  ALDACTONE Take 2 tablets (100 mg total) by mouth 2 (two) times daily.   traZODone 50 MG tablet Commonly known as:  DESYREL Take 50 mg by mouth at bedtime.   XIFAXAN 550 MG Tabs tablet Generic drug:  rifaximin Take 1 tablet by mouth 2 (two) times daily.          Total Time in preparing paper work, data evaluation and todays exam - 35 minutes  Auburn BilberryShreyang Tabitha Riggins M.D on 10/28/2017 at 12:17 PM [facility]   Office  [phone]

## 2017-10-28 NOTE — Progress Notes (Signed)
Nursing leader for the unit rounding on pt and at bedside.   Katrina Cohen

## 2017-10-30 LAB — BODY FLUID CULTURE: CULTURE: NO GROWTH

## 2017-10-30 LAB — CULTURE, BLOOD (ROUTINE X 2): Special Requests: ADEQUATE

## 2017-11-01 LAB — CULTURE, BLOOD (ROUTINE X 2): Culture: NO GROWTH

## 2017-11-02 ENCOUNTER — Encounter (HOSPITAL_COMMUNITY): Payer: Self-pay | Admitting: Student

## 2017-11-02 ENCOUNTER — Ambulatory Visit (HOSPITAL_COMMUNITY)
Admission: RE | Admit: 2017-11-02 | Discharge: 2017-11-02 | Disposition: A | Discharge status: Home / Self Care | Payer: Medicaid Other | Source: Ambulatory Visit | Attending: Internal Medicine | Admitting: Internal Medicine

## 2017-11-02 DIAGNOSIS — K7031 Alcoholic cirrhosis of liver with ascites: Secondary | ICD-10-CM | POA: Insufficient documentation

## 2017-11-02 HISTORY — PX: IR PARACENTESIS: IMG2679

## 2017-11-02 LAB — BODY FLUID CELL COUNT WITH DIFFERENTIAL
LYMPHS FL: 63 %
MONOCYTE-MACROPHAGE-SEROUS FLUID: 35 % — AB (ref 50–90)
Neutrophil Count, Fluid: 2 % (ref 0–25)
WBC FLUID: 95 uL (ref 0–1000)

## 2017-11-02 LAB — ALBUMIN, PLEURAL OR PERITONEAL FLUID: Albumin, Fluid: <1 g/dL

## 2017-11-02 MED ORDER — LIDOCAINE HCL (PF) 2 % IJ SOLN
INTRAMUSCULAR | Status: AC
  Filled 2017-11-02: qty 20

## 2017-11-02 MED ORDER — LIDOCAINE HCL (PF) 2 % IJ SOLN
INTRAMUSCULAR | Status: DC | PRN
  Administered 2017-11-02: 10 mL

## 2017-11-02 MED ORDER — ALBUMIN NICU 25% IV SOLUTION
25.0000 g | Freq: Once | INTRAVENOUS | Status: AC
  Administered 2017-11-02: 25 g via INTRAVENOUS
  Filled 2017-11-02 (×3): qty 100

## 2017-11-02 NOTE — Procedures (Signed)
PROCEDURE SUMMARY:  Successful image-guided paracentesis from the left lower abdomen.  Yielded 7.3 liters of clear gold fluid.  No immediate complications.  Patient tolerated well.   Specimen was sent for labs.  Gordy Councilmanlexandra Clarise Chacko PA-C 11/02/2017 2:17 PM

## 2017-11-04 ENCOUNTER — Ambulatory Visit (HOSPITAL_COMMUNITY): Payer: Medicaid Other

## 2017-11-07 ENCOUNTER — Other Ambulatory Visit: Payer: Self-pay | Admitting: Internal Medicine

## 2017-11-07 DIAGNOSIS — K7031 Alcoholic cirrhosis of liver with ascites: Secondary | ICD-10-CM

## 2017-11-08 ENCOUNTER — Ambulatory Visit (HOSPITAL_COMMUNITY)
Admission: RE | Admit: 2017-11-08 | Discharge: 2017-11-08 | Disposition: A | Discharge status: Home / Self Care | Payer: Medicaid Other | Source: Ambulatory Visit | Attending: Internal Medicine | Admitting: Internal Medicine

## 2017-11-08 ENCOUNTER — Encounter (HOSPITAL_COMMUNITY): Payer: Self-pay | Admitting: Physician Assistant

## 2017-11-08 DIAGNOSIS — K7030 Alcoholic cirrhosis of liver without ascites: Secondary | ICD-10-CM | POA: Diagnosis present

## 2017-11-08 DIAGNOSIS — K7031 Alcoholic cirrhosis of liver with ascites: Secondary | ICD-10-CM

## 2017-11-08 HISTORY — PX: IR PARACENTESIS: IMG2679

## 2017-11-08 LAB — BODY FLUID CELL COUNT WITH DIFFERENTIAL
LYMPHS FL: 54 %
MONOCYTE-MACROPHAGE-SEROUS FLUID: 44 % — AB (ref 50–90)
Neutrophil Count, Fluid: 2 % (ref 0–25)
WBC FLUID: 84 uL (ref 0–1000)

## 2017-11-08 LAB — ALBUMIN, PLEURAL OR PERITONEAL FLUID: Albumin, Fluid: <1 g/dL

## 2017-11-08 MED ORDER — LIDOCAINE HCL (PF) 2 % IJ SOLN
INTRAMUSCULAR | Status: AC
  Filled 2017-11-08: qty 20

## 2017-11-08 MED ORDER — ALBUMIN HUMAN 25 % IV SOLN
25.0000 g | Freq: Once | INTRAVENOUS | Status: AC
  Administered 2017-11-08: 25 g via INTRAVENOUS
  Filled 2017-11-08: qty 100

## 2017-11-08 MED ORDER — LIDOCAINE HCL (PF) 2 % IJ SOLN
INTRAMUSCULAR | Status: DC | PRN
  Administered 2017-11-08: 10 mL

## 2017-11-08 NOTE — Procedures (Signed)
PROCEDURE SUMMARY:  Successful US guided paracentesis from left lateral abdomen.  Yielded 9 liters of cloudy yellow fluid.  No immediate complications.  Patient tolerated well.   Specimen was sent for labs.  WENDY S BLAIR PA-C 11/08/2017 10:43 AM

## 2017-11-09 ENCOUNTER — Other Ambulatory Visit: Payer: Self-pay

## 2017-11-09 ENCOUNTER — Emergency Department: Payer: Medicaid Other

## 2017-11-09 ENCOUNTER — Encounter: Payer: Self-pay | Admitting: Emergency Medicine

## 2017-11-09 ENCOUNTER — Emergency Department
Admission: EM | Admit: 2017-11-09 | Discharge: 2017-11-09 | Disposition: A | Discharge status: Home / Self Care | Payer: Medicaid Other | Attending: Emergency Medicine | Admitting: Emergency Medicine

## 2017-11-09 DIAGNOSIS — R140 Abdominal distension (gaseous): Secondary | ICD-10-CM

## 2017-11-09 DIAGNOSIS — K7031 Alcoholic cirrhosis of liver with ascites: Secondary | ICD-10-CM | POA: Diagnosis not present

## 2017-11-09 DIAGNOSIS — E119 Type 2 diabetes mellitus without complications: Secondary | ICD-10-CM | POA: Diagnosis not present

## 2017-11-09 DIAGNOSIS — R188 Other ascites: Secondary | ICD-10-CM

## 2017-11-09 DIAGNOSIS — Z79899 Other long term (current) drug therapy: Secondary | ICD-10-CM | POA: Insufficient documentation

## 2017-11-09 DIAGNOSIS — F17210 Nicotine dependence, cigarettes, uncomplicated: Secondary | ICD-10-CM | POA: Insufficient documentation

## 2017-11-09 LAB — COMPREHENSIVE METABOLIC PANEL
ALK PHOS: 149 U/L — AB (ref 38–126)
ALT: 23 U/L (ref 14–54)
ANION GAP: 11 (ref 5–15)
AST: 53 U/L — ABNORMAL HIGH (ref 15–41)
Albumin: 3.5 g/dL (ref 3.5–5.0)
BILIRUBIN TOTAL: 5.1 mg/dL — AB (ref 0.3–1.2)
BUN: 16 mg/dL (ref 6–20)
CALCIUM: 9.1 mg/dL (ref 8.9–10.3)
CO2: 25 mmol/L (ref 22–32)
Chloride: 91 mmol/L — ABNORMAL LOW (ref 101–111)
Creatinine, Ser: 1.05 mg/dL — ABNORMAL HIGH (ref 0.44–1.00)
GFR calc non Af Amer: 60 mL/min — ABNORMAL LOW (ref >60)
Glucose, Bld: 214 mg/dL — ABNORMAL HIGH (ref 65–99)
Potassium: 3.5 mmol/L (ref 3.5–5.1)
Sodium: 127 mmol/L — ABNORMAL LOW (ref 135–145)
TOTAL PROTEIN: 6 g/dL — AB (ref 6.5–8.1)

## 2017-11-09 LAB — URINALYSIS, COMPLETE (UACMP) WITH MICROSCOPIC
BACTERIA UA: NONE SEEN
BILIRUBIN URINE: NEGATIVE
Glucose, UA: NEGATIVE mg/dL
Hgb urine dipstick: NEGATIVE
KETONES UR: NEGATIVE mg/dL
LEUKOCYTES UA: NEGATIVE
NITRITE: NEGATIVE
PH: 6 (ref 5.0–8.0)
Protein, ur: NEGATIVE mg/dL
Specific Gravity, Urine: 1.005 (ref 1.005–1.030)

## 2017-11-09 LAB — CBC
HCT: 31.6 % — ABNORMAL LOW (ref 35.0–47.0)
HEMOGLOBIN: 10.8 g/dL — AB (ref 12.0–16.0)
MCH: 30.7 pg (ref 26.0–34.0)
MCHC: 34 g/dL (ref 32.0–36.0)
MCV: 90.3 fL (ref 80.0–100.0)
Platelets: 61 10*3/uL — ABNORMAL LOW (ref 150–440)
RBC: 3.5 MIL/uL — AB (ref 3.80–5.20)
RDW: 17.6 % — ABNORMAL HIGH (ref 11.5–14.5)
WBC: 2.4 10*3/uL — ABNORMAL LOW (ref 3.6–11.0)

## 2017-11-09 LAB — LIPASE, BLOOD: Lipase: 62 U/L — ABNORMAL HIGH (ref 11–51)

## 2017-11-09 LAB — PH, BODY FLUID: PH, BODY FLUID: 8

## 2017-11-09 NOTE — ED Notes (Signed)
Pt ambulatory around unit. Denies needs at this time.

## 2017-11-09 NOTE — ED Notes (Signed)
PT updated on delay

## 2017-11-09 NOTE — ED Notes (Addendum)
Called ultrasound and IR about procedure. They are working to get pt over but states that it may be a while. Informed pt of this. Pt denies further needs at this time.

## 2017-11-09 NOTE — ED Notes (Signed)
Pt ambulatory with steady gait to bathroom. Denies needs at this time. Updated her that she will be going to IR as soon as they were available. Family remains at bedside.

## 2017-11-09 NOTE — ED Provider Notes (Signed)
[facility] Emergency Department Provider Note   ____________________________________________    I have reviewed the triage vital signs and the nursing notes.   HISTORY  Chief Complaint Abdominal Pain     HPI Katrina Cohen is a 52 y.o. female with a history of cirrhosis who typically receives paracentesis every 2 weeks who presents today with abdominal distention.  Patient is frustrated because she has had her centesis yesterday and greater than 9 L was drawn off.  She complains of abdominal distention and discomfort which is typical when she has fluid buildup.  She follows with Dr. Norma Fredricksonoledo who is her GI.  There is some question about compliance with her medications.  No fevers or chills.  Past Medical History:  Diagnosis Date  . Bipolar 2 disorder (HCC)   . Bipolar disorder (HCC) 1980s   with depression, anxiety  . Cirrhosis (HCC)   . Depression   . Depression with anxiety 1980s   [facility]    . Diabetes mellitus   . Dysphagia 11/2014   normal esophagram 11/2014. EGD per Dr Dulce Sellarutlaw.   . Hepatitis   . Hypertension   . Jaundice 04/2015   In setting of infectious mononucleosis. Ultrasound in [place]: hepatosplenomegaly, portal htn, possible cirrhosis  . Liver disease   . Mononucleosis 04/15/2015    Patient Active Problem List   Diagnosis Date Noted  . Cirrhosis of liver with ascites (HCC) 10/27/2017  . Encephalopathy, hepatic (HCC)   . Acute encephalopathy 01/18/2017  . Right upper quadrant abdominal pain 04/02/2016  . UTI (urinary tract infection) 04/02/2016  . Cholelithiasis 04/02/2016  . Depression with anxiety 04/02/2016  . Chronic pain 04/02/2016  . SOB (shortness of breath) 11/12/2015  . COPD exacerbation (HCC) 11/12/2015  . Acute respiratory failure with hypoxia (HCC) 11/12/2015  . CAP (community acquired pneumonia) 11/07/2015  . Cirrhosis (HCC) 11/07/2015  . Thrombocytopenia (HCC) 11/07/2015  . Alcohol abuse  04/30/2015  . Acute hepatitis 04/30/2015  . Tobacco abuse 04/30/2015  . Malnutrition of moderate degree 04/29/2015  . Hypokalemia 04/29/2015  . Anemia 04/29/2015  . Hyponatremia 04/29/2015  . Coagulopathy (HCC) 04/29/2015  . Hypoalbuminemia 04/29/2015  . Mononucleosis 04/28/2015  . Vitamin D deficiency 06/11/2011  . Major depression (HCC) 06/10/2011    Past Surgical History:  Procedure Laterality Date  . APPENDECTOMY    . CESAREAN SECTION    . COLONOSCOPY    . ESOPHAGOGASTRODUODENOSCOPY     Dr Dulce Sellarutlaw ? 2016.   Gerald Leitz. IR GENERIC HISTORICAL  04/20/2016   IR PARACENTESIS 04/20/2016 Darrell K Allred, PA-C [facility]INTER[facility]  . IR PARACENTESIS  03/23/2017  . IR PARACENTESIS  04/08/2017  . IR PARACENTESIS  07/18/2017  . IR PARACENTESIS  09/07/2017  . IR PARACENTESIS  09/14/2017  . IR PARACENTESIS  09/23/2017  . IR PARACENTESIS  10/06/2017  . IR PARACENTESIS  10/14/2017  . IR PARACENTESIS  10/21/2017  . IR PARACENTESIS  11/02/2017  . IR PARACENTESIS  11/08/2017    Prior to Admission medications   Medication Sig Start Date End Date Taking? Authorizing Provider  albuterol (PROVENTIL) (2.5 MG/3ML) 0.083% nebulizer solution Take 3 mLs (2.5 mg total) by nebulization every 4 (four) hours as needed for wheezing or shortness of breath. Patient not taking: Reported on 06/29/2017 11/13/15   Standley BrookingGoodrich, Daniel P, MD  ARIPiprazole (ABILIFY) 5 MG tablet Take 5 mg by mouth at bedtime.  04/02/15   [provider]  citalopram (CELEXA) 20 MG tablet TAKE 1 TABLET  BY MOUTH EVERY NIGHT 12/20/16   [provider]  furosemide (LASIX) 40 MG tablet Take 1 tablet (40 mg total) by mouth 2 (two) times daily. 10/28/17   Auburn Bilberry, MD  Milk Thistle Extract 175 MG TABS Take 1 tablet by mouth daily.     [provider]  morphine (MS CONTIN) 30 MG 12 hr tablet Take 30 mg by mouth daily.    [provider]  Multiple Vitamins-Minerals (MULTIVITAMIN ADULTS) TABS Take 2 tablets by mouth daily.     [provider]  Nettle, Urtica Dioica, (NETTLE LEAF PO) Take 1 capsule by mouth 2 (two) times daily.    [provider]  ondansetron (ZOFRAN ODT) 4 MG disintegrating tablet Take 1 tablet (4 mg total) by mouth every 8 (eight) hours as needed for nausea or vomiting. Patient not taking: Reported on 10/27/2017 01/07/17   Dorena Bodo, NP  pioglitazone (ACTOS) 30 MG tablet Take 30 mg by mouth daily.    [provider]  spironolactone (ALDACTONE) 50 MG tablet Take 2 tablets (100 mg total) by mouth 2 (two) times daily. 10/28/17   Auburn Bilberry, MD  traZODone (DESYREL) 50 MG tablet Take 50 mg by mouth at bedtime.    [provider]  XIFAXAN 550 MG TABS tablet Take 1 tablet by mouth 2 (two) times daily. 06/10/17   [provider]     Allergies Tizanidine  Family History  Problem Relation Age of Onset  . Diabetes Mother   . Cancer Father   . Diabetes Sister   . Diabetes Brother     Social History Social History   Tobacco Use  . Smoking status: Current Every Day Smoker    Packs/day: 0.50    Types: Cigarettes  . Smokeless tobacco: Never Used  Substance Use Topics  . Alcohol use: Not Currently    Comment: sober for over 1 year  . Drug use: No    Review of Systems  Constitutional: No fever/chills Eyes: No visual changes.  ENT: No sore throat. Cardiovascular: Denies chest pain. Respiratory: Denies shortness of breath. Gastrointestinal: Abdominal distention and abdominal discomfort Genitourinary: Negative for dysuria. Musculoskeletal: Negative for back pain. Skin: Negative for rash. Neurological: Negative for headaches   ____________________________________________   PHYSICAL EXAM:  VITAL SIGNS: ED Triage Vitals  Enc Vitals Group     BP 11/09/17 0951 (!) 108/48     Pulse Rate 11/09/17 0951 73     Resp 11/09/17 0951 20     Temp 11/09/17 0951 98.3 F (36.8 C)     Temp Source 11/09/17 0951 Oral     SpO2 11/09/17 0951 100 %      Weight 11/09/17 0956 80.7 kg (178 lb)     Height 11/09/17 0956 1.626 m (5\' 4" )     Head Circumference --      Peak Flow --      Pain Score 11/09/17 0956 8     Pain Loc --      Pain Edu? --      Excl. in GC? --     Constitutional: Alert and oriented.   Eyes: Conjunctivae are normal.   Nose: No congestion/rhinnorhea. Mouth/Throat: Mucous membranes are moist.   Neck:  Painless ROM Cardiovascular: Normal rate, regular rhythm. Grossly normal heart sounds.  Good peripheral circulation. Respiratory: Normal respiratory effort.  No retractions. Lungs CTAB. Gastrointestinal: Distended abdomen with positive fluid wave, no significant tenderness to palpation  Musculoskeletal: .  Warm and well perfused Neurologic:  Normal  speech and language. No gross focal neurologic deficits are appreciated.  Skin:  Skin is warm, dry and intact. No rash noted. Psychiatric: Mood and affect are normal. Speech and behavior are normal.  ____________________________________________   LABS (all labs ordered are listed, but only abnormal results are displayed)  Labs Reviewed  LIPASE, BLOOD - Abnormal; Notable for the following components:      Result Value   Lipase 62 (*)    All other components within normal limits  COMPREHENSIVE METABOLIC PANEL - Abnormal; Notable for the following components:   Sodium 127 (*)    Chloride 91 (*)    Glucose, Bld 214 (*)    Creatinine, Ser 1.05 (*)    Total Protein 6.0 (*)    AST 53 (*)    Alkaline Phosphatase 149 (*)    Total Bilirubin 5.1 (*)    GFR calc non Af Amer 60 (*)    All other components within normal limits  CBC - Abnormal; Notable for the following components:   WBC 2.4 (*)    RBC 3.50 (*)    Hemoglobin 10.8 (*)    HCT 31.6 (*)    RDW 17.6 (*)    Platelets 61 (*)    All other components within normal limits  URINALYSIS, COMPLETE (UACMP) WITH MICROSCOPIC - Abnormal; Notable for the following components:   Color, Urine YELLOW (*)    APPearance CLEAR  (*)    All other components within normal limits   ____________________________________________  EKG  None ____________________________________________  RADIOLOGY  None ____________________________________________   PROCEDURES  Procedure(s) performed: No  Procedures   Critical Care performed: No ____________________________________________   INITIAL IMPRESSION / ASSESSMENT AND PLAN / ED COURSE  Pertinent labs & imaging results that were available during my care of the patient were reviewed by me and considered in my medical decision making (see chart for details).  Discussed with Dr. Norma Fredrickson, patient's GI, he recommends paracentesis with less than 5 L removed and he will follow-up with the patient has an outpatient.  Reviewed ascites fluid analysis from yesterday, no concern for SBP    ____________________________________________   FINAL CLINICAL IMPRESSION(S) / ED DIAGNOSES  Final diagnoses:  Abdominal distension  Ascites of liver        Note:  This document was prepared using Dragon voice recognition software and may include unintentional dictation errors.    Jene Every, MD 11/09/17 1500

## 2017-11-09 NOTE — ED Provider Notes (Signed)
Paracentesis completed.  Stable for discharge with follow-up with gastroenterology tomorrow as scheduled.   [name], [name], MD 11/09/17 [phone]

## 2017-11-09 NOTE — ED Notes (Signed)
Patient transported to Ultrasound 

## 2017-11-09 NOTE — ED Notes (Signed)
Assumed care of pt at this time. Pt resting. Respirations equal and unlabored. NAD. Stretcher in low and locked position. Pt denies needs. Will continue to monitor.

## 2017-11-09 NOTE — ED Notes (Signed)
First Nurse Note: Patient states she had a paracentesis yesterday and abdomen is swollen again today.  Distended abdomen noted.

## 2017-11-09 NOTE — ED Triage Notes (Addendum)
Pt arrived via POV with reports of paracentesis yesterday, took off 9L, pt states she is having abdominal pain and gained 5lbs since yesterday.  Pt c/o having abdominal pain and nausea.  Pt reports abdominal pain is all over. Pt states her abdomen is distended again this morning when yesterday after the paracentesis her abdomen was soft.  Pt had 2 paracentesis in the past week, last Wednesday took off 7.3L and yesterday took off 9L.

## 2017-11-09 NOTE — ED Notes (Signed)
BLUE AND RED TUBE SENT TO LAB WITH SAVE LABELS 

## 2017-11-09 NOTE — ED Notes (Signed)
Called IR about procedure and they said that it would be completed as soon as ultrasound is available.

## 2017-11-11 LAB — BODY FLUID CULTURE
Culture: NO GROWTH
Gram Stain: NONE SEEN

## 2017-11-12 DIAGNOSIS — E871 Hypo-osmolality and hyponatremia: Secondary | ICD-10-CM | POA: Diagnosis not present

## 2017-11-12 DIAGNOSIS — E559 Vitamin D deficiency, unspecified: Secondary | ICD-10-CM | POA: Insufficient documentation

## 2017-11-12 DIAGNOSIS — Z79899 Other long term (current) drug therapy: Secondary | ICD-10-CM | POA: Insufficient documentation

## 2017-11-12 DIAGNOSIS — J449 Chronic obstructive pulmonary disease, unspecified: Secondary | ICD-10-CM | POA: Insufficient documentation

## 2017-11-12 DIAGNOSIS — E119 Type 2 diabetes mellitus without complications: Secondary | ICD-10-CM | POA: Insufficient documentation

## 2017-11-12 DIAGNOSIS — Z833 Family history of diabetes mellitus: Secondary | ICD-10-CM | POA: Diagnosis not present

## 2017-11-12 DIAGNOSIS — Z8619 Personal history of other infectious and parasitic diseases: Secondary | ICD-10-CM | POA: Insufficient documentation

## 2017-11-12 DIAGNOSIS — Z7951 Long term (current) use of inhaled steroids: Secondary | ICD-10-CM | POA: Insufficient documentation

## 2017-11-12 DIAGNOSIS — I10 Essential (primary) hypertension: Secondary | ICD-10-CM | POA: Insufficient documentation

## 2017-11-12 DIAGNOSIS — E876 Hypokalemia: Secondary | ICD-10-CM | POA: Diagnosis not present

## 2017-11-12 DIAGNOSIS — D689 Coagulation defect, unspecified: Secondary | ICD-10-CM | POA: Diagnosis not present

## 2017-11-12 DIAGNOSIS — R1310 Dysphagia, unspecified: Secondary | ICD-10-CM | POA: Insufficient documentation

## 2017-11-12 DIAGNOSIS — Z809 Family history of malignant neoplasm, unspecified: Secondary | ICD-10-CM | POA: Diagnosis not present

## 2017-11-12 DIAGNOSIS — F419 Anxiety disorder, unspecified: Secondary | ICD-10-CM | POA: Diagnosis not present

## 2017-11-12 DIAGNOSIS — Z6831 Body mass index (BMI) 31.0-31.9, adult: Secondary | ICD-10-CM | POA: Insufficient documentation

## 2017-11-12 DIAGNOSIS — Z87891 Personal history of nicotine dependence: Secondary | ICD-10-CM | POA: Diagnosis not present

## 2017-11-12 DIAGNOSIS — R188 Other ascites: Secondary | ICD-10-CM | POA: Diagnosis present

## 2017-11-12 DIAGNOSIS — G8929 Other chronic pain: Secondary | ICD-10-CM | POA: Insufficient documentation

## 2017-11-12 DIAGNOSIS — D696 Thrombocytopenia, unspecified: Secondary | ICD-10-CM | POA: Insufficient documentation

## 2017-11-12 DIAGNOSIS — K7031 Alcoholic cirrhosis of liver with ascites: Principal | ICD-10-CM | POA: Insufficient documentation

## 2017-11-12 DIAGNOSIS — G9340 Encephalopathy, unspecified: Secondary | ICD-10-CM | POA: Insufficient documentation

## 2017-11-12 DIAGNOSIS — E440 Moderate protein-calorie malnutrition: Secondary | ICD-10-CM | POA: Diagnosis not present

## 2017-11-12 DIAGNOSIS — Z888 Allergy status to other drugs, medicaments and biological substances status: Secondary | ICD-10-CM | POA: Diagnosis not present

## 2017-11-12 DIAGNOSIS — D649 Anemia, unspecified: Secondary | ICD-10-CM | POA: Diagnosis not present

## 2017-11-12 DIAGNOSIS — E8809 Other disorders of plasma-protein metabolism, not elsewhere classified: Secondary | ICD-10-CM | POA: Insufficient documentation

## 2017-11-12 DIAGNOSIS — F319 Bipolar disorder, unspecified: Secondary | ICD-10-CM | POA: Insufficient documentation

## 2017-11-12 DIAGNOSIS — Z7682 Awaiting organ transplant status: Secondary | ICD-10-CM | POA: Diagnosis not present

## 2017-11-13 ENCOUNTER — Encounter: Payer: Self-pay | Admitting: Emergency Medicine

## 2017-11-13 ENCOUNTER — Observation Stay
Admission: EM | Admit: 2017-11-13 | Discharge: 2017-11-14 | Disposition: A | Discharge status: Home / Self Care | Payer: Medicaid Other | Attending: Internal Medicine | Admitting: Internal Medicine

## 2017-11-13 ENCOUNTER — Other Ambulatory Visit: Payer: Self-pay

## 2017-11-13 DIAGNOSIS — K7031 Alcoholic cirrhosis of liver with ascites: Secondary | ICD-10-CM

## 2017-11-13 DIAGNOSIS — R188 Other ascites: Secondary | ICD-10-CM

## 2017-11-13 LAB — CBC WITH DIFFERENTIAL/PLATELET
BASOS ABS: 0 10*3/uL (ref 0–0.1)
Basophils Relative: 1 %
EOS ABS: 0.1 10*3/uL (ref 0–0.7)
Eosinophils Relative: 2 %
HEMATOCRIT: 33.7 % — AB (ref 35.0–47.0)
HEMOGLOBIN: 11.5 g/dL — AB (ref 12.0–16.0)
Lymphocytes Relative: 17 %
Lymphs Abs: 0.7 10*3/uL — ABNORMAL LOW (ref 1.0–3.6)
MCH: 30.7 pg (ref 26.0–34.0)
MCHC: 34.1 g/dL (ref 32.0–36.0)
MCV: 90.1 fL (ref 80.0–100.0)
MONOS PCT: 12 %
Monocytes Absolute: 0.5 10*3/uL (ref 0.2–0.9)
NEUTROS ABS: 2.7 10*3/uL (ref 1.4–6.5)
NEUTROS PCT: 68 %
Platelets: 61 10*3/uL — ABNORMAL LOW (ref 150–440)
RBC: 3.74 MIL/uL — ABNORMAL LOW (ref 3.80–5.20)
RDW: 17.3 % — AB (ref 11.5–14.5)
WBC: 4 10*3/uL (ref 3.6–11.0)

## 2017-11-13 LAB — URINALYSIS, COMPLETE (UACMP) WITH MICROSCOPIC
Bilirubin Urine: NEGATIVE
Glucose, UA: NEGATIVE mg/dL
HGB URINE DIPSTICK: NEGATIVE
KETONES UR: NEGATIVE mg/dL
LEUKOCYTES UA: NEGATIVE
Nitrite: NEGATIVE
PH: 7 (ref 5.0–8.0)
Protein, ur: NEGATIVE mg/dL
SPECIFIC GRAVITY, URINE: 1.003 — AB (ref 1.005–1.030)

## 2017-11-13 LAB — GLUCOSE, CAPILLARY
GLUCOSE-CAPILLARY: 113 mg/dL — AB (ref 65–99)
Glucose-Capillary: 120 mg/dL — ABNORMAL HIGH (ref 65–99)
Glucose-Capillary: 139 mg/dL — ABNORMAL HIGH (ref 65–99)
Glucose-Capillary: 156 mg/dL — ABNORMAL HIGH (ref 65–99)

## 2017-11-13 LAB — BASIC METABOLIC PANEL
Anion gap: 12 (ref 5–15)
BUN: 16 mg/dL (ref 6–20)
CALCIUM: 9 mg/dL (ref 8.9–10.3)
CO2: 25 mmol/L (ref 22–32)
CREATININE: 1.02 mg/dL — AB (ref 0.44–1.00)
Chloride: 92 mmol/L — ABNORMAL LOW (ref 101–111)
GFR calc Af Amer: >60 mL/min (ref >60)
GLUCOSE: 138 mg/dL — AB (ref 65–99)
Potassium: 3.8 mmol/L (ref 3.5–5.1)
SODIUM: 129 mmol/L — AB (ref 135–145)

## 2017-11-13 LAB — HEPATIC FUNCTION PANEL
ALT: 27 U/L (ref 14–54)
AST: 66 U/L — ABNORMAL HIGH (ref 15–41)
Albumin: 3.6 g/dL (ref 3.5–5.0)
Alkaline Phosphatase: 145 U/L — ABNORMAL HIGH (ref 38–126)
BILIRUBIN DIRECT: 2.3 mg/dL — AB (ref 0.1–0.5)
Indirect Bilirubin: 3.7 mg/dL — ABNORMAL HIGH (ref 0.3–0.9)
TOTAL PROTEIN: 6.6 g/dL (ref 6.5–8.1)
Total Bilirubin: 6 mg/dL — ABNORMAL HIGH (ref 0.3–1.2)

## 2017-11-13 LAB — LIPASE, BLOOD: LIPASE: 66 U/L — AB (ref 11–51)

## 2017-11-13 MED ORDER — ONDANSETRON HCL 4 MG PO TABS: 4.0000 mg | ORAL_TABLET | Freq: Four times a day (QID) | ORAL | Status: DC | PRN

## 2017-11-13 MED ORDER — TRAZODONE HCL 50 MG PO TABS
50.0000 mg | ORAL_TABLET | Freq: Every day | ORAL | Status: DC
  Administered 2017-11-13: 50 mg via ORAL
  Filled 2017-11-13: qty 1

## 2017-11-13 MED ORDER — HYDROMORPHONE HCL 1 MG/ML IJ SOLN
1.0000 mg | INTRAMUSCULAR | Status: DC | PRN
Start: 2017-11-13 — End: 2017-11-14
  Administered 2017-11-13: 1 mg via INTRAVENOUS
  Filled 2017-11-13: qty 1

## 2017-11-13 MED ORDER — ONDANSETRON HCL 4 MG/2ML IJ SOLN: 4.0000 mg | Freq: Four times a day (QID) | INTRAMUSCULAR | Status: DC | PRN

## 2017-11-13 MED ORDER — ARIPIPRAZOLE 5 MG PO TABS
5.0000 mg | ORAL_TABLET | Freq: Every day | ORAL | Status: DC
  Administered 2017-11-13: 5 mg via ORAL
  Filled 2017-11-13 (×2): qty 1

## 2017-11-13 MED ORDER — INSULIN ASPART 100 UNIT/ML ~~LOC~~ SOLN
0.0000 [IU] | Freq: Three times a day (TID) | SUBCUTANEOUS | Status: DC
  Filled 2017-11-13: qty 1

## 2017-11-13 MED ORDER — HYDROMORPHONE HCL 1 MG/ML IJ SOLN
0.5000 mg | INTRAMUSCULAR | Status: DC | PRN
Start: 2017-11-13 — End: 2017-11-14

## 2017-11-13 MED ORDER — ALBUTEROL SULFATE (2.5 MG/3ML) 0.083% IN NEBU: 2.5000 mg | INHALATION_SOLUTION | RESPIRATORY_TRACT | Status: DC | PRN

## 2017-11-13 MED ORDER — ENOXAPARIN SODIUM 40 MG/0.4ML ~~LOC~~ SOLN: 40.0000 mg | SUBCUTANEOUS | Status: DC

## 2017-11-13 MED ORDER — FUROSEMIDE 40 MG PO TABS
40.0000 mg | ORAL_TABLET | Freq: Two times a day (BID) | ORAL | Status: DC
  Administered 2017-11-13 – 2017-11-14 (×3): 40 mg via ORAL
  Filled 2017-11-13 (×3): qty 1

## 2017-11-13 MED ORDER — SENNOSIDES-DOCUSATE SODIUM 8.6-50 MG PO TABS: 1.0000 | ORAL_TABLET | Freq: Every evening | ORAL | Status: DC | PRN

## 2017-11-13 MED ORDER — SPIRONOLACTONE 25 MG PO TABS
100.0000 mg | ORAL_TABLET | Freq: Two times a day (BID) | ORAL | Status: DC
  Administered 2017-11-13 – 2017-11-14 (×3): 100 mg via ORAL
  Filled 2017-11-13 (×3): qty 4

## 2017-11-13 MED ORDER — RIFAXIMIN 550 MG PO TABS
550.0000 mg | ORAL_TABLET | Freq: Two times a day (BID) | ORAL | Status: DC
  Administered 2017-11-13 – 2017-11-14 (×3): 550 mg via ORAL
  Filled 2017-11-13 (×3): qty 1

## 2017-11-13 MED ORDER — MORPHINE SULFATE ER 30 MG PO TBCR
30.0000 mg | EXTENDED_RELEASE_TABLET | Freq: Every day | ORAL | Status: DC
Start: 2017-11-13 — End: 2017-11-14
  Administered 2017-11-13 – 2017-11-14 (×2): 30 mg via ORAL
  Filled 2017-11-13 (×2): qty 1

## 2017-11-13 MED ORDER — HYDROMORPHONE HCL 1 MG/ML IJ SOLN
1.0000 mg | Freq: Once | INTRAMUSCULAR | Status: AC
  Administered 2017-11-13: 1 mg via INTRAVENOUS
  Filled 2017-11-13: qty 1

## 2017-11-13 MED ORDER — CITALOPRAM HYDROBROMIDE 20 MG PO TABS
20.0000 mg | ORAL_TABLET | Freq: Every day | ORAL | Status: DC
  Administered 2017-11-13: 20 mg via ORAL
  Filled 2017-11-13: qty 1

## 2017-11-13 MED ORDER — BISACODYL 5 MG PO TBEC: 5.0000 mg | DELAYED_RELEASE_TABLET | Freq: Every day | ORAL | Status: DC | PRN

## 2017-11-13 MED ORDER — ADULT MULTIVITAMIN W/MINERALS CH
2.0000 | ORAL_TABLET | Freq: Every day | ORAL | Status: DC
  Administered 2017-11-13 – 2017-11-14 (×2): 2 via ORAL
  Filled 2017-11-13 (×3): qty 2

## 2017-11-13 NOTE — H&P (Signed)
[facility] [place] [facility]   PATIENT NAME: Katrina Cohen    MR#:  [id number]  DATE OF BIRTH:  06-12-1965  DATE OF ADMISSION:  11/13/2017  PRIMARY CARE PHYSICIAN: Burnis MedinFulbright, [place] E, [place]   REQUESTING/REFERRING PHYSICIAN: Dionne BucySiadecki, Sebastian, MD  CHIEF COMPLAINT:   Chief Complaint  Patient presents with  . Abdominal Pain    HISTORY OF PRESENT ILLNESS:  Katrina Cohen  is a 52 y.o. female with a known history of alcholic liver cirrhosis/ESLD, [facility], w/ recurrent ascites, now presenting w/ recurrent ascites. I admitted Katrina Cohen on 06/06 under similar circumstances. She presently endorses progressively-worsening abdominal distension and abdominal discomfort, severe x1d. She denies N/V or decreased appetite. She states she has been adherent to her medications and a proper low-sodium diet. She has not eaten any fast food of late. She states that after she had paracentesis on 06/06, she had paracentesis again on 06/12 and 06/18. She states her ascites is reaccumulating more rapidly. She endorses a 9lb weight gain in the past day. Endorses leg edema. She denies fever, chills, diaphoresis, night sweats and rigors.  PAST MEDICAL HISTORY:   Past Medical History:  Diagnosis Date  . Bipolar 2 disorder (HCC)   . Bipolar disorder (HCC) 1980s   with depression, anxiety  . Cirrhosis (HCC)   . Depression   . Depression with anxiety 1980s   [facility]    . Diabetes mellitus   . Dysphagia 11/2014   normal esophagram 11/2014. EGD per Dr Dulce Sellarutlaw.   . Hepatitis   . Hypertension   . Jaundice 04/2015   In setting of infectious mononucleosis. Ultrasound in [place]: hepatosplenomegaly, portal htn, possible cirrhosis  . Liver disease   . Mononucleosis 04/15/2015    PAST SURGICAL HISTORY:   Past Surgical History:  Procedure Laterality Date  . APPENDECTOMY    . CESAREAN SECTION    . COLONOSCOPY    .  ESOPHAGOGASTRODUODENOSCOPY     Dr Dulce Sellarutlaw ? 2016.   Gerald Leitz. IR GENERIC HISTORICAL  04/20/2016   IR PARACENTESIS 04/20/2016 Darrell K Allred, PA-C [facility]INTER[facility]  . IR PARACENTESIS  03/23/2017  . IR PARACENTESIS  04/08/2017  . IR PARACENTESIS  07/18/2017  . IR PARACENTESIS  09/07/2017  . IR PARACENTESIS  09/14/2017  . IR PARACENTESIS  09/23/2017  . IR PARACENTESIS  10/06/2017  . IR PARACENTESIS  10/14/2017  . IR PARACENTESIS  10/21/2017  . IR PARACENTESIS  11/02/2017  . IR PARACENTESIS  11/08/2017    SOCIAL HISTORY:   Social History   Tobacco Use  . Smoking status: Former Smoker    Packs/day: 0.50    Types: Cigarettes  . Smokeless tobacco: Never Used  Substance Use Topics  . Alcohol use: Not Currently    FAMILY HISTORY:   Family History  Problem Relation Age of Onset  . Diabetes Mother   . Cancer Father   . Diabetes Sister   . Diabetes Brother     DRUG ALLERGIES:   Allergies  Allergen Reactions  . Tizanidine Other (See Comments)    Dizzy,falls    REVIEW OF SYSTEMS:   Review of Systems  Constitutional: Negative for chills, diaphoresis, fever, malaise/fatigue and weight loss.  HENT: Negative for congestion, ear pain, hearing loss, nosebleeds, sinus pain, sore throat and tinnitus.   Eyes: Negative for blurred vision, double vision and photophobia.  Respiratory: Negative for cough, hemoptysis, sputum production, shortness of breath and wheezing.   Cardiovascular: Positive  for leg swelling. Negative for chest pain, palpitations, orthopnea, claudication and PND.  Gastrointestinal: Positive for abdominal pain. Negative for blood in stool, constipation, diarrhea, heartburn, melena, nausea and vomiting.  Genitourinary: Negative for dysuria, frequency, hematuria and urgency.  Musculoskeletal: Negative for back pain, joint pain, myalgias and neck pain.  Skin: Negative for itching and rash.  Neurological: Negative for dizziness, tingling, tremors, sensory change, speech change, focal  weakness, seizures, loss of consciousness, weakness and headaches.  Psychiatric/Behavioral: Negative for memory loss. The patient does not have insomnia.    MEDICATIONS AT HOME:   Prior to Admission medications   Medication Sig Start Date End Date Taking? Authorizing Provider  albuterol (PROVENTIL) (2.5 MG/3ML) 0.083% nebulizer solution Take 3 mLs (2.5 mg total) by nebulization every 4 (four) hours as needed for wheezing or shortness of breath. 11/13/15  Yes Standley Brooking, MD  ARIPiprazole (ABILIFY) 5 MG tablet Take 5 mg by mouth at bedtime.  04/02/15  Yes [provider]  citalopram (CELEXA) 20 MG tablet TAKE 1 TABLET BY MOUTH EVERY NIGHT 12/20/16  Yes [provider]  furosemide (LASIX) 40 MG tablet Take 1 tablet (40 mg total) by mouth 2 (two) times daily. Patient taking differently: Take 40 mg by mouth 3 (three) times daily.  10/28/17  Yes Auburn Bilberry, MD  Milk Thistle Extract 175 MG TABS Take 1 tablet by mouth daily.    Yes [provider]  morphine (MS CONTIN) 30 MG 12 hr tablet Take 30 mg by mouth daily.   Yes [provider]  Multiple Vitamins-Minerals (MULTIVITAMIN ADULTS) TABS Take 2 tablets by mouth daily.   Yes [provider]  Nettle, Urtica Dioica, (NETTLE LEAF PO) Take 1 capsule by mouth 2 (two) times daily.   Yes [provider]  ondansetron (ZOFRAN ODT) 4 MG disintegrating tablet Take 1 tablet (4 mg total) by mouth every 8 (eight) hours as needed for nausea or vomiting. 01/07/17  Yes Dorena Bodo, NP  pioglitazone (ACTOS) 30 MG tablet Take 30 mg by mouth daily.   Yes [provider]  spironolactone (ALDACTONE) 50 MG tablet Take 2 tablets (100 mg total) by mouth 2 (two) times daily. 10/28/17  Yes Auburn Bilberry, MD  traZODone (DESYREL) 50 MG tablet Take 50 mg by mouth at bedtime.   Yes [provider]  XIFAXAN 550 MG TABS tablet Take 1 tablet by mouth 2 (two) times daily. 06/10/17  Yes [provider]      VITAL SIGNS:  Blood pressure 137/62, pulse 84, temperature 98.3 F (36.8 C), temperature source Oral, resp. rate 19, height 5\' 4"  (1.626 m), weight 85.7 kg (189 lb), last menstrual period 01/29/2013, SpO2 98 %.  PHYSICAL EXAMINATION:  Physical Exam  Constitutional: She is oriented to person, place, and time. She appears well-developed. She is active and cooperative.  Non-toxic appearance. She appears ill. No distress. She is not intubated.  Chronically-ill appearing  HENT:  Head: Normocephalic and atraumatic.  Mouth/Throat: Oropharynx is clear and moist. No oropharyngeal exudate.  Eyes: Conjunctivae, EOM and lids are normal. Scleral icterus is present.  Neck: Neck supple. No JVD present. No thyromegaly present.  Cardiovascular: Normal rate, regular rhythm, S1 normal, S2 normal and normal heart sounds.  No extrasystoles are present. Exam reveals no gallop, no S3, no S4, no distant heart sounds and no friction rub.  No murmur heard. Pulmonary/Chest: Effort normal and breath sounds normal. No accessory muscle usage or stridor. No apnea, no tachypnea and no bradypnea. She  is not intubated. No respiratory distress. She has no decreased breath sounds. She has no wheezes. She has no rhonchi. She has no rales.  Abdominal: Bowel sounds are normal. She exhibits distension and ascites. There is generalized tenderness. There is rebound. There is no guarding.  Musculoskeletal: She exhibits edema. She exhibits no tenderness.  Lymphadenopathy:    She has no cervical adenopathy.  Neurological: She is alert and oriented to person, place, and time. She is not disoriented.  Skin: Skin is warm, dry and intact. No rash noted. She is not diaphoretic. No erythema.  Psychiatric: She has a normal mood and affect. Her speech is normal and behavior is normal. Judgment and thought content normal. Cognition and memory are normal.   LABORATORY PANEL:   CBC Recent Labs  Lab 11/13/17 0026  WBC  4.0  HGB 11.5*  HCT 33.7*  PLT 61*   ------------------------------------------------------------------------------------------------------------------  Chemistries  Recent Labs  Lab 11/13/17 0026  NA 129*  K 3.8  CL 92*  CO2 25  GLUCOSE 138*  BUN 16  CREATININE 1.02*  CALCIUM 9.0  AST 66*  ALT 27  ALKPHOS 145*  BILITOT 6.0*   ------------------------------------------------------------------------------------------------------------------  Cardiac Enzymes No results for input(s): TROPONINI in the last 168 hours. ------------------------------------------------------------------------------------------------------------------  RADIOLOGY:  No results found.  IMPRESSION AND PLAN:   A/P: 18F cirrhosis/ESLD p/w recurrent ascites, abdominal distension w/ discomfort. Also w/ hyponatremia, hyperglycemia (w/ T2NIDDM), transaminasemia, hypoalbuminemia, hyperbilirubinemia, normocytic anemia, thrombocytopenia. -Obs Med/Surg -Symptomatic mgmt, antiemetics, pain ctrl -NPO, IR for paracentesis -Coags pending -Cr stable (at baseline) -Lab abnormalities consistent w/ cirrhosis/ESLD -Going for transplant evaluation at [facility] this coming week -SSI, hold PO antihyperglycemics -c/w pther home meds -NPO for procedure -Lovenox -Full code -Observation, < 2 midnights   All the records are reviewed and case discussed with ED provider. Management plans discussed with the patient, family and they are in agreement.  CODE STATUS: Full code  TOTAL TIME TAKING CARE OF THIS PATIENT: 75 minutes.    Barbaraann Rondo M.D on 11/13/2017 at 2:36 AM  Between 7am to 6pm - Pager - [phone]  After 6pm go to [email or web address] - Social research officer, government  [facility]  Office  [phone]  CC: Primary care physician; Piedad Climes, [place], PA-C   Note: This dictation was prepared with Dragon dictation along with smaller phrase technology. Any transcriptional errors that  result from this process are unintentional.

## 2017-11-13 NOTE — Plan of Care (Signed)
  Problem: Education: Goal: Knowledge of General Education information will improve Outcome: Progressing   Problem: Clinical Measurements: Goal: Ability to maintain clinical measurements within normal limits will improve Outcome: Progressing Goal: Diagnostic test results will improve Outcome: Progressing   Problem: Activity: Goal: Risk for activity intolerance will decrease Outcome: Progressing   Problem: Pain Managment: Goal: General experience of comfort will improve Outcome: Progressing

## 2017-11-13 NOTE — ED Provider Notes (Signed)
[facility] Emergency Department Provider Note ____________________________________________   First MD Initiated Contact with Patient 11/13/17 0009     (approximate)  I have reviewed the triage vital signs and the nursing notes.   HISTORY  Chief Complaint Abdominal Pain    HPI Katrina Cohen is a [age] y.o. female with PMH as noted below including history of cirrhosis and ascites who presents with worsening abdominal distention over the last 2 days, gradual onset, associated with worsening pain acutely today.  Patient reports that the pain was initially epigastric, and is now more generalized and in the lower abdomen as well.  She denies nausea or vomiting, and has no fever chills.  No urinary symptoms.  She states it feels like the pain she gets when her ascites worsens.   Past Medical History:  Diagnosis Date  . Bipolar 2 disorder (HCC)   . Bipolar disorder (HCC) 1980s   with depression, anxiety  . Cirrhosis (HCC)   . Depression   . Depression with anxiety 1980s   [facility]    . Diabetes mellitus   . Dysphagia 11/2014   normal esophagram 11/2014. EGD per Dr Dulce Sellarutlaw.   . Hepatitis   . Hypertension   . Jaundice 04/2015   In setting of infectious mononucleosis. Ultrasound in [place]: hepatosplenomegaly, portal htn, possible cirrhosis  . Liver disease   . Mononucleosis 04/15/2015    Patient Active Problem List   Diagnosis Date Noted  . Cirrhosis of liver with ascites (HCC) 10/27/2017  . Encephalopathy, hepatic (HCC)   . Acute encephalopathy 01/18/2017  . Right upper quadrant abdominal pain 04/02/2016  . UTI (urinary tract infection) 04/02/2016  . Cholelithiasis 04/02/2016  . Depression with anxiety 04/02/2016  . Chronic pain 04/02/2016  . SOB (shortness of breath) 11/12/2015  . COPD exacerbation (HCC) 11/12/2015  . Acute respiratory failure with hypoxia (HCC) 11/12/2015  . CAP (community acquired pneumonia) 11/07/2015  .  Cirrhosis (HCC) 11/07/2015  . Thrombocytopenia (HCC) 11/07/2015  . Alcohol abuse 04/30/2015  . Acute hepatitis 04/30/2015  . Tobacco abuse 04/30/2015  . Malnutrition of moderate degree 04/29/2015  . Hypokalemia 04/29/2015  . Anemia 04/29/2015  . Hyponatremia 04/29/2015  . Coagulopathy (HCC) 04/29/2015  . Hypoalbuminemia 04/29/2015  . Mononucleosis 04/28/2015  . Vitamin D deficiency 06/11/2011  . Major depression (HCC) 06/10/2011    Past Surgical History:  Procedure Laterality Date  . APPENDECTOMY    . CESAREAN SECTION    . COLONOSCOPY    . ESOPHAGOGASTRODUODENOSCOPY     Dr Dulce Sellarutlaw ? 2016.   Gerald Leitz. IR GENERIC HISTORICAL  04/20/2016   IR PARACENTESIS 04/20/2016 Darrell K Allred, PA-C [facility]  . IR PARACENTESIS  03/23/2017  . IR PARACENTESIS  04/08/2017  . IR PARACENTESIS  07/18/2017  . IR PARACENTESIS  09/07/2017  . IR PARACENTESIS  09/14/2017  . IR PARACENTESIS  09/23/2017  . IR PARACENTESIS  10/06/2017  . IR PARACENTESIS  10/14/2017  . IR PARACENTESIS  10/21/2017  . IR PARACENTESIS  11/02/2017  . IR PARACENTESIS  11/08/2017    Prior to Admission medications   Medication Sig Start Date End Date Taking? Authorizing Provider  albuterol (PROVENTIL) (2.5 MG/3ML) 0.083% nebulizer solution Take 3 mLs (2.5 mg total) by nebulization every 4 (four) hours as needed for wheezing or shortness of breath. Patient not taking: Reported on 06/29/2017 11/13/15   Standley BrookingGoodrich, Daniel P, MD  ARIPiprazole (ABILIFY) 5 MG tablet Take 5 mg by mouth at bedtime.  04/02/15  [provider]  citalopram (CELEXA) 20 MG tablet TAKE 1 TABLET BY MOUTH EVERY NIGHT 12/20/16   [provider]  furosemide (LASIX) 40 MG tablet Take 1 tablet (40 mg total) by mouth 2 (two) times daily. 10/28/17   Auburn Bilberry, MD  Milk Thistle Extract 175 MG TABS Take 1 tablet by mouth daily.     [provider]  morphine (MS CONTIN) 30 MG 12 hr tablet Take 30 mg by mouth daily.    [provider]  Multiple  Vitamins-Minerals (MULTIVITAMIN ADULTS) TABS Take 2 tablets by mouth daily.    [provider]  Nettle, Urtica Dioica, (NETTLE LEAF PO) Take 1 capsule by mouth 2 (two) times daily.    [provider]  ondansetron (ZOFRAN ODT) 4 MG disintegrating tablet Take 1 tablet (4 mg total) by mouth every 8 (eight) hours as needed for nausea or vomiting. Patient not taking: Reported on 10/27/2017 01/07/17   Dorena Bodo, NP  pioglitazone (ACTOS) 30 MG tablet Take 30 mg by mouth daily.    [provider]  spironolactone (ALDACTONE) 50 MG tablet Take 2 tablets (100 mg total) by mouth 2 (two) times daily. 10/28/17   Auburn Bilberry, MD  traZODone (DESYREL) 50 MG tablet Take 50 mg by mouth at bedtime.    [provider]  XIFAXAN 550 MG TABS tablet Take 1 tablet by mouth 2 (two) times daily. 06/10/17   [provider]    Allergies Tizanidine  Family History  Problem Relation Age of Onset  . Diabetes Mother   . Cancer Father   . Diabetes Sister   . Diabetes Brother     Social History Social History   Tobacco Use  . Smoking status: Former Smoker    Packs/day: 0.50    Types: Cigarettes  . Smokeless tobacco: Never Used  Substance Use Topics  . Alcohol use: Not Currently  . Drug use: No    Review of Systems  Constitutional: No fever. Eyes: No redness. ENT: No sore throat. Cardiovascular: Denies chest pain. Respiratory: Denies shortness of breath. Gastrointestinal: No vomiting or diarrhea.  Genitourinary: Negative for dysuria.  Musculoskeletal: Negative for back pain. Skin: Negative for rash. Neurological: Negative for headache.   ____________________________________________   PHYSICAL EXAM:  VITAL SIGNS: ED Triage Vitals  Enc Vitals Group     BP 11/13/17 0014 126/67     Pulse Rate 11/13/17 0014 84     Resp 11/13/17 0014 18     Temp 11/13/17 0014 98 F (36.7 C)     Temp Source 11/13/17 0014 Oral     SpO2 11/13/17 0014 100 %      Weight 11/13/17 0007 189 lb (85.7 kg)     Height 11/13/17 0007 5\' 4"  (1.626 m)     Head Circumference --      Peak Flow --      Pain Score 11/13/17 0006 9     Pain Loc --      Pain Edu? --      Excl. in GC? --     Constitutional: Alert and oriented.  Uncomfortable appearing but in no acute distress. Eyes: Conjunctivae are normal.  No scleral icterus. Head: Atraumatic. Nose: No congestion/rhinnorhea. Mouth/Throat: Mucous membranes are moist.   Neck: Normal range of motion.  Cardiovascular: Good peripheral circulation. Respiratory: Normal respiratory effort.  No retractions. Lungs CTAB. Gastrointestinal: Significantly distended with fluid wave.  Mild diffuse discomfort to palpation, but no focal tenderness. Genitourinary: No flank tenderness. Musculoskeletal:  Extremities warm and well perfused.  Neurologic:  Normal speech and language. No gross focal neurologic deficits are appreciated.  Skin:  Skin is warm and dry. No rash noted. Psychiatric: Mood and affect are normal. Speech and behavior are normal.  ____________________________________________   LABS (all labs ordered are listed, but only abnormal results are displayed)  Labs Reviewed  BASIC METABOLIC PANEL - Abnormal; Notable for the following components:      Result Value   Sodium 129 (*)    Chloride 92 (*)    Glucose, Bld 138 (*)    Creatinine, Ser 1.02 (*)    All other components within normal limits  HEPATIC FUNCTION PANEL - Abnormal; Notable for the following components:   AST 66 (*)    Alkaline Phosphatase 145 (*)    Total Bilirubin 6.0 (*)    Bilirubin, Direct 2.3 (*)    Indirect Bilirubin 3.7 (*)    All other components within normal limits  LIPASE, BLOOD - Abnormal; Notable for the following components:   Lipase 66 (*)    All other components within normal limits  CBC WITH DIFFERENTIAL/PLATELET - Abnormal; Notable for the following components:   RBC 3.74 (*)    Hemoglobin 11.5 (*)    HCT 33.7 (*)     RDW 17.3 (*)    Platelets 61 (*)    Lymphs Abs 0.7 (*)    All other components within normal limits  URINALYSIS, COMPLETE (UACMP) WITH MICROSCOPIC - Abnormal; Notable for the following components:   Color, Urine YELLOW (*)    APPearance CLEAR (*)    Specific Gravity, Urine 1.003 (*)    Bacteria, UA RARE (*)    All other components within normal limits   ____________________________________________  EKG   ____________________________________________  RADIOLOGY    ____________________________________________   PROCEDURES  Procedure(s) performed: No  Procedures  Critical Care performed: No ____________________________________________   INITIAL IMPRESSION / ASSESSMENT AND PLAN / ED COURSE  Pertinent labs & imaging results that were available during my care of the patient were reviewed by me and considered in my medical decision making (see chart for details).  52 year old female with PMH as noted above presents with worsening abdominal distention over the last 2 days and generalized abdominal pain this evening.  No fever or other associated symptoms.  I reviewed the past medical records in epic; the patient was seen in the ED 4 days ago for worsening ascites and had therapeutic paracentesis performed in IR yielding 9 L of fluid.  The patient most recently had fluid analysis on 11/08/2017, and there was no evidence of infection.  Overall presentation is consistent with worsening ascites.  I have a low suspicion for SBP given the relatively acute onset of the pain, the lack of fever, and the negative fluid analysis within the past week.  Given patient's significant ascites, she will likely need recurrent therapeutic paracentesis.  We will obtain labs and admit for paracentesis.  ----------------------------------------- 1:42 AM on 11/13/2017 -----------------------------------------  Lab work-up is unremarkable.  Overall there is no clinical evidence for SBP.  Patient is  afebrile, has no elevated WBC, and her pain has been adequately controlled after the initial dose of analgesia.  The patient is stable for admission.  I signed her out to the hospitalist Dr. Marjie Skiff.  ____________________________________________   FINAL CLINICAL IMPRESSION(S) / ED DIAGNOSES  Final diagnoses:  Other ascites      NEW MEDICATIONS STARTED DURING THIS VISIT:  New Prescriptions   No medications on  file     Note:  This document was prepared using Dragon voice recognition software and may include unintentional dictation errors.    Dionne Bucy, MD 11/13/17 [id number]

## 2017-11-13 NOTE — ED Triage Notes (Signed)
Patient states that she has a history of cirrhosis. Patient states that she has had increase in the fluid on her abdomen since yesterday. Patient states that she had 9 lb weight gain since yesterday. Patient reports she started having right lower abdominal pain about an hour ago. Patient states that she had a paracentesis on Wednesday.

## 2017-11-13 NOTE — Progress Notes (Signed)
Patient seen an examined. Waiting for [employer] this is. Would likely not be done over the weekend. Has follow up with [facility]ology for transplant validation on Wednesday. Likely discharge tomorrow after [employer] [facility]. No signs of infection.

## 2017-11-13 NOTE — Progress Notes (Signed)
Lab in to draw. Patient states she is tired of being stuck and will give one chance.  Lab tech unable to get return

## 2017-11-14 ENCOUNTER — Observation Stay: Payer: Medicaid Other

## 2017-11-14 LAB — GLUCOSE, CAPILLARY: GLUCOSE-CAPILLARY: 152 mg/dL — AB (ref 65–99)

## 2017-11-14 NOTE — Progress Notes (Signed)
Pt discharged home with family. Escorted to vehicle in wheelchair by Psychiatristvolunteer staff.

## 2017-11-14 NOTE — Progress Notes (Signed)
Discharge instructions given. [facility]. Pt verbalizes understanding of instructions given.

## 2017-11-14 NOTE — Discharge Instructions (Signed)
Alcoholic Liver Disease °Alcoholic liver disease happens when the liver does not work the way it should. The condition is caused by drinking too much alcohol for many years. °Follow these instructions at home: °· Do not drink alcohol. °· Take medicines only as told by your doctor. °· Take vitamins only as told by your doctor. °· Follow any diet instructions that your doctor gave you. You may need to: °? Eat foods that have thiamine. These include whole-wheat cereals, pork, and raw vegetables. °? Eat foods that have folic acid. These include vegetables, fruits, meats, beans, nuts, and dairy foods. °? Eat foods that are high in carbohydrates. These include yogurt, beans, potatoes, and rice. °Contact a doctor if: °· You have a fever. °· You are short of breath. °· You have trouble breathing. °· You have bright red blood in your poop (stool). °· Your poop looks like tar. °· You throw up (vomit) blood. °· Your skin looks more yellow, pale, or dark. °· You get headaches. °· You have trouble thinking. °· You have trouble balancing or walking. °This information is not intended to replace advice given to you by your health care provider. Make sure you discuss any questions you have with your health care provider. °Document Released: 03/07/2009 Document Revised: 10/16/2015 Document Reviewed: 04/11/2014 °Elsevier Interactive Patient Education © 2018 Elsevier Inc. °Ascites °Ascites is a collection of excess fluid in the abdomen. Ascites can range from mild to severe. It can get worse without treatment. °What are the causes? °Possible causes include: °· Cirrhosis. This is the most common cause of ascites. °· Infection or inflammation in the abdomen. °· Cancer in the abdomen. °· Heart failure. °· Kidney disease. °· Inflammation of the pancreas. °· Clots in the veins of the liver. ° °What are the signs or symptoms? °Signs and symptoms may include: °· A feeling of fullness in your abdomen. This is common. °· An increase in the size  of your abdomen or your waist. °· Swelling in your legs. °· Swelling of the scrotum in men. °· Difficulty breathing. °· Abdominal pain. °· Sudden weight gain. ° °If the condition is mild, you may not have symptoms. °How is this diagnosed? °To make a diagnosis, your health care provider will: °· Ask about your medical history. °· Perform a physical exam. °· Order imaging tests, such as an ultrasound or CT scan of your abdomen. ° °How is this treated? °Treatment depends on the cause of the ascites. It may include: °· Taking a pill to make you urinate. This is called a water pill (diuretic pill). °· Strictly reducing your salt (sodium) intake. Salt can cause extra fluid to be kept in the body, and this makes ascites worse. °· Having a procedure to remove fluid from your abdomen (paracentesis). °· Having a procedure to transfer fluid from your abdomen into a vein. °· Having a procedure that connects two of the major veins within your liver and relieves pressure on your liver (TIPS procedure). ° °Ascites may go away or improve with treatment of the condition that caused it. °Follow these instructions at home: °· Keep track of your weight. To do this, weigh yourself at the same time every day and record your weight. °· Keep track of how much you drink and any changes in the amount you urinate. °· Follow any instructions that your health care provider gives you about how much to drink. °· Try not to eat salty (high-sodium) foods. °· Take medicines only as directed by your health   care provider. °· Keep all follow-up visits as directed by your health care provider. This is important. °· Report any changes in your health to your health care provider, especially if you develop new symptoms or your symptoms get worse. °Contact a health care provider if: °· Your gain more than 3 pounds in 3 days. °· Your abdominal size or your waist size increases. °· You have new swelling in your legs. °· The swelling in your legs gets  worse. °Get help right away if: °· You develop a fever. °· You develop confusion. °· You develop new or worsening difficulty breathing. °· You develop new or worsening abdominal pain. °· You develop new or worsening swelling in the scrotum (in men). °This information is not intended to replace advice given to you by your health care provider. Make sure you discuss any questions you have with your health care provider. °Document Released: 05/10/2005 Document Revised: 09/17/2015 Document Reviewed: 12/07/2013 °Elsevier Interactive Patient Education © 2018 Elsevier Inc. ° °

## 2017-11-16 NOTE — Discharge Summary (Signed)
[facility] [place] [facility]   PATIENT NAME: Katrina Cohen    MR#:  [id number]  DATE OF BIRTH:  June 21, 1965  DATE OF ADMISSION:  11/13/2017   ADMITTING PHYSICIAN: Barbaraann Rondo, MD  DATE OF DISCHARGE: 11/14/2017 12:30 PM  PRIMARY CARE PHYSICIAN: [place], [place] E, PA-C   ADMISSION DIAGNOSIS:  Other ascites [R18.8] DISCHARGE DIAGNOSIS:  Active Problems:   Ascites due to alcoholic cirrhosis (HCC)  SECONDARY DIAGNOSIS:   Past Medical History:  Diagnosis Date  . Bipolar 2 disorder (HCC)   . Bipolar disorder (HCC) 1980s   with depression, anxiety  . Cirrhosis (HCC)   . Depression   . Depression with anxiety 1980s   [facility] admission 05/2011.    . Diabetes mellitus   . Dysphagia 11/2014   normal esophagram 11/2014. EGD per Dr Dulce Sellar.   . Hepatitis   . Hypertension   . Jaundice 04/2015   In setting of infectious mononucleosis. Ultrasound in [place]: hepatosplenomegaly, portal htn, possible cirrhosis  . Liver disease   . Mononucleosis 04/15/2015   HOSPITAL COURSE:  52 y.o. female with a known history of alcholic liver cirrhosis/ESLD, pending liver transplant evaluation at [facility], w/ recurrent ascites, now admitted for recurrent ascites.   * Recurrent Ascites: She states that after she had paracentesis on 06/06, she had paracentesis again on 06/12 and 06/18. She has standing order to get this drained as an outpt at [facility] but states her ascites is reaccumulating more rapidly than she could get to [place]. She endorses a 9 lb weight gain in the past day. Endorses leg edema. she underwent Successful ultrasound-guided paracentesis yielding 5.1 L liters of peritoneal fluid on 6/24 without any immediate complications. She was feeling much better and was D/Ced home in stable condition as she had f/up at [facility] for transplant eval on 6/25. DISCHARGE CONDITIONS:  stable CONSULTS OBTAINED:  Treatment Team:  Barbaraann Rondo, MD DRUG ALLERGIES:    Allergies  Allergen Reactions  . Tizanidine Other (See Comments)    Dizzy,falls   DISCHARGE MEDICATIONS:   Allergies as of 11/14/2017      Reactions   Tizanidine Other (See Comments)   Dizzy,falls      Medication List    TAKE these medications   albuterol (2.5 MG/3ML) 0.083% nebulizer solution Commonly known as:  PROVENTIL Take 3 mLs (2.5 mg total) by nebulization every 4 (four) hours as needed for wheezing or shortness of breath.   ARIPiprazole 5 MG tablet Commonly known as:  ABILIFY Take 5 mg by mouth at bedtime.   citalopram 20 MG tablet Commonly known as:  CELEXA TAKE 1 TABLET BY MOUTH EVERY NIGHT   furosemide 40 MG tablet Commonly known as:  LASIX Take 1 tablet (40 mg total) by mouth 2 (two) times daily. What changed:  when to take this   Milk Thistle Extract 175 MG Tabs Take 1 tablet by mouth daily.   morphine 30 MG 12 hr tablet Commonly known as:  MS CONTIN Take 30 mg by mouth daily.   MULTIVITAMIN ADULTS Tabs Take 2 tablets by mouth daily.   NETTLE LEAF PO Take 1 capsule by mouth 2 (two) times daily.   ondansetron 4 MG disintegrating tablet Commonly known as:  ZOFRAN ODT Take 1 tablet (4 mg total) by mouth every 8 (eight) hours as needed for nausea or vomiting.   pioglitazone 30 MG tablet Commonly known as:  ACTOS Take 30 mg by mouth daily.   spironolactone 50 MG tablet  Commonly known as:  ALDACTONE Take 2 tablets (100 mg total) by mouth 2 (two) times daily.   traZODone 50 MG tablet Commonly known as:  DESYREL Take 50 mg by mouth at bedtime.   XIFAXAN 550 MG Tabs tablet Generic drug:  rifaximin Take 1 tablet by mouth 2 (two) times daily.        DISCHARGE INSTRUCTIONS:   DIET:  Regular diet DISCHARGE CONDITION:  Good ACTIVITY:  Activity as tolerated OXYGEN:  Home Oxygen: No.  Oxygen Delivery: room air DISCHARGE LOCATION:  home   If you experience worsening of your admission symptoms, develop shortness of breath, life  threatening emergency, suicidal or homicidal thoughts you must seek medical attention immediately by calling 911 or calling your MD immediately  if symptoms less severe.  You Must read complete instructions/literature along with all the possible adverse reactions/side effects for all the Medicines you take and that have been prescribed to you. Take any new Medicines after you have completely understood and accpet all the possible adverse reactions/side effects.   Please note  You were cared for by a hospitalist during your hospital stay. If you have any questions about your discharge medications or the care you received while you were in the hospital after you are discharged, you can call the unit and asked to speak with the hospitalist on call if the hospitalist that took care of you is not available. Once you are discharged, your primary care physician will handle any further medical issues. Please note that NO REFILLS for any discharge medications will be authorized once you are discharged, as it is imperative that you return to your primary care physician (or establish a relationship with a primary care physician if you do not have one) for your aftercare needs so that they can reassess your need for medications and monitor your lab values.    On the day of Discharge:  VITAL SIGNS:  Blood pressure (!) 110/59, pulse 86, temperature 98.2 F (36.8 C), temperature source Oral, resp. rate 16, height 5\' 4"  (1.626 m), weight 82.8 kg (182 lb 8 oz), last menstrual period 01/29/2013, SpO2 96 %. PHYSICAL EXAMINATION:  GENERAL:  52 y.o.-year-old patient lying in the bed with no acute distress.  EYES: Pupils equal, round, reactive to light and accommodation. No scleral icterus. Extraocular muscles intact.  HEENT: Head atraumatic, normocephalic. Oropharynx and nasopharynx clear.  NECK:  Supple, no jugular venous distention. No thyroid enlargement, no tenderness.  LUNGS: Normal breath sounds bilaterally, no  wheezing, rales,rhonchi or crepitation. No use of accessory muscles of respiration.  CARDIOVASCULAR: S1, S2 normal. No murmurs, rubs, or gallops.  ABDOMEN: Soft, non-tender, non-distended. Bowel sounds present. No organomegaly or mass.  EXTREMITIES: No pedal edema, cyanosis, or clubbing.  NEUROLOGIC: Cranial nerves II through XII are intact. Muscle strength 5/5 in all extremities. Sensation intact. Gait not checked.  PSYCHIATRIC: The patient is alert and oriented x 3.  SKIN: No obvious rash, lesion, or ulcer.  DATA REVIEW:   CBC Recent Labs  Lab 11/13/17 0026  WBC 4.0  HGB 11.5*  HCT 33.7*  PLT 61*    Chemistries  Recent Labs  Lab 11/13/17 0026  NA 129*  K 3.8  CL 92*  CO2 25  GLUCOSE 138*  BUN 16  CREATININE 1.02*  CALCIUM 9.0  AST 66*  ALT 27  ALKPHOS 145*  BILITOT 6.0*     Follow-up Information    [place], [place] E, PA-C. Schedule an appointment as soon as possible for a  visit on 11/21/2017.   Specialty:  Family Medicine Why:  @ 8:20 am Contact information: [address] [place] [facility] [place] [address] [phone]           Management plans discussed with the patient, family and they are in agreement.  CODE STATUS: Prior   TOTAL TIME TAKING CARE OF THIS PATIENT: 45 minutes.    Delfino LovettVipul Gaylan Fauver M.D on 11/16/2017 at 11:52 AM  Between 7am to 6pm - Pager - [phone]  After 6pm go to [email or web address] - Social research officer, [email or web address] [email or web address]  [facility]  Office  [phone]  CC: Primary care physician; Piedad ClimesFulbright, [place] E, PA-C   Note: This dictation was prepared with Dragon dictation along with smaller phrase technology. Any transcriptional errors that result from this process are unintentional.

## 2017-11-18 ENCOUNTER — Other Ambulatory Visit: Payer: Self-pay | Admitting: Internal Medicine

## 2017-11-18 DIAGNOSIS — K7031 Alcoholic cirrhosis of liver with ascites: Secondary | ICD-10-CM

## 2017-11-22 ENCOUNTER — Ambulatory Visit (HOSPITAL_COMMUNITY)
Admission: RE | Admit: 2017-11-22 | Discharge: 2017-11-22 | Disposition: A | Discharge status: Home / Self Care | Payer: Medicaid Other | Source: Ambulatory Visit | Attending: Internal Medicine | Admitting: Internal Medicine

## 2017-11-22 ENCOUNTER — Encounter (HOSPITAL_COMMUNITY): Payer: Self-pay | Admitting: Student

## 2017-11-22 DIAGNOSIS — K7031 Alcoholic cirrhosis of liver with ascites: Secondary | ICD-10-CM

## 2017-11-22 DIAGNOSIS — R188 Other ascites: Secondary | ICD-10-CM | POA: Insufficient documentation

## 2017-11-22 HISTORY — PX: IR PARACENTESIS: IMG2679

## 2017-11-22 LAB — GRAM STAIN

## 2017-11-22 LAB — ALBUMIN, PLEURAL OR PERITONEAL FLUID

## 2017-11-22 LAB — BODY FLUID CELL COUNT WITH DIFFERENTIAL
LYMPHS FL: 52 %
Monocyte-Macrophage-Serous Fluid: 44 % — ABNORMAL LOW (ref 50–90)
NEUTROPHIL FLUID: 4 % (ref 0–25)
Total Nucleated Cell Count, Fluid: 88 cu mm (ref 0–1000)

## 2017-11-22 MED ORDER — LIDOCAINE HCL (PF) 2 % IJ SOLN
INTRAMUSCULAR | Status: AC
  Filled 2017-11-22: qty 20

## 2017-11-22 MED ORDER — LIDOCAINE HCL (PF) 1 % IJ SOLN
INTRAMUSCULAR | Status: AC | PRN
  Administered 2017-11-22: 10 mL

## 2017-11-22 MED ORDER — ALBUMIN HUMAN 25 % IV SOLN
25.0000 g | Freq: Once | INTRAVENOUS | Status: DC
  Filled 2017-11-22: qty 100

## 2017-11-22 NOTE — Procedures (Signed)
PROCEDURE SUMMARY:  Successful US guided paracentesis from left lateral abdomen.  Yielded 7.4 liters of clear, yellow fluid.  No immediate complications.  Pt tolerated well.   Specimen was sent for labs. Patient to receive 25g albumin with procedure today.   Hoyt KochKacie Sue-Ellen Yarelis Ambrosino PA-C 11/22/2017 2:07 PM

## 2017-11-23 ENCOUNTER — Other Ambulatory Visit: Payer: Self-pay | Admitting: Internal Medicine

## 2017-11-23 DIAGNOSIS — K7031 Alcoholic cirrhosis of liver with ascites: Secondary | ICD-10-CM

## 2017-11-25 ENCOUNTER — Ambulatory Visit (HOSPITAL_COMMUNITY)
Admission: RE | Admit: 2017-11-25 | Discharge: 2017-11-25 | Disposition: A | Discharge status: Home / Self Care | Payer: Medicaid Other | Source: Ambulatory Visit | Attending: Internal Medicine | Admitting: Internal Medicine

## 2017-11-25 ENCOUNTER — Other Ambulatory Visit: Payer: Self-pay | Admitting: Internal Medicine

## 2017-11-25 ENCOUNTER — Encounter (HOSPITAL_COMMUNITY): Payer: Self-pay | Admitting: Student

## 2017-11-25 DIAGNOSIS — R188 Other ascites: Secondary | ICD-10-CM | POA: Insufficient documentation

## 2017-11-25 DIAGNOSIS — K7031 Alcoholic cirrhosis of liver with ascites: Secondary | ICD-10-CM

## 2017-11-25 HISTORY — PX: IR PARACENTESIS: IMG2679

## 2017-11-25 LAB — BODY FLUID CELL COUNT WITH DIFFERENTIAL
Eos, Fluid: 0 %
Lymphs, Fluid: 79 %
MONOCYTE-MACROPHAGE-SEROUS FLUID: 18 % — AB (ref 50–90)
Neutrophil Count, Fluid: 3 % (ref 0–25)
Total Nucleated Cell Count, Fluid: 63 cu mm (ref 0–1000)

## 2017-11-25 LAB — ALBUMIN, PLEURAL OR PERITONEAL FLUID: Albumin, Fluid: <1 g/dL

## 2017-11-25 MED ORDER — LIDOCAINE HCL (PF) 2 % IJ SOLN
INTRAMUSCULAR | Status: AC
  Filled 2017-11-25: qty 20

## 2017-11-25 MED ORDER — LIDOCAINE HCL (PF) 2 % IJ SOLN
INTRAMUSCULAR | Status: DC | PRN
  Administered 2017-11-25: 10 mL

## 2017-11-25 NOTE — Procedures (Signed)
PROCEDURE SUMMARY:  Successful US guided paracentesis from left lateral abdomen.  Yielded 5.5 liters of yellow fluid.  No immediate complications.  Pt tolerated well.   Specimen was sent for labs.  Hoyt KochKacie Sue-Ellen Matthews PA-C 11/25/2017 1:18 PM

## 2017-11-27 LAB — CULTURE, BODY FLUID W GRAM STAIN -BOTTLE: Culture: NO GROWTH

## 2017-11-28 ENCOUNTER — Ambulatory Visit (HOSPITAL_COMMUNITY)
Admission: RE | Admit: 2017-11-28 | Discharge: 2017-11-28 | Disposition: A | Discharge status: Home / Self Care | Payer: Medicaid Other | Source: Ambulatory Visit | Attending: Internal Medicine | Admitting: Internal Medicine

## 2017-11-28 ENCOUNTER — Encounter (HOSPITAL_COMMUNITY): Payer: Self-pay | Admitting: Student

## 2017-11-28 DIAGNOSIS — R188 Other ascites: Secondary | ICD-10-CM | POA: Diagnosis not present

## 2017-11-28 DIAGNOSIS — K7031 Alcoholic cirrhosis of liver with ascites: Secondary | ICD-10-CM

## 2017-11-28 HISTORY — PX: IR PARACENTESIS: IMG2679

## 2017-11-28 LAB — BODY FLUID CULTURE: Culture: NO GROWTH

## 2017-11-28 LAB — BODY FLUID CELL COUNT WITH DIFFERENTIAL
Lymphs, Fluid: 52 %
Monocyte-Macrophage-Serous Fluid: 41 % — ABNORMAL LOW (ref 50–90)
Neutrophil Count, Fluid: 7 % (ref 0–25)
Total Nucleated Cell Count, Fluid: 14 cu mm (ref 0–1000)

## 2017-11-28 MED ORDER — LIDOCAINE HCL (PF) 2 % IJ SOLN
INTRAMUSCULAR | Status: DC | PRN
  Administered 2017-11-28: 10 mL

## 2017-11-28 MED ORDER — LIDOCAINE HCL (PF) 2 % IJ SOLN
INTRAMUSCULAR | Status: AC
  Filled 2017-11-28: qty 20

## 2017-11-28 NOTE — Procedures (Signed)
PROCEDURE SUMMARY:  Successful US guided paracentesis from left lateral abdomen.  Yielded 5.8 liters of yellow fluid.  No immediate complications.  Pt tolerated well.   Specimen was sent for labs. Patient to receive albumin post-procedure.   Hoyt KochKacie Sue-Ellen Samuele Storey PA-C 11/28/2017 2:19 PM  ]

## 2017-11-29 ENCOUNTER — Encounter (HOSPITAL_COMMUNITY): Payer: Self-pay

## 2017-11-29 ENCOUNTER — Emergency Department (HOSPITAL_COMMUNITY)
Admission: EM | Admit: 2017-11-29 | Discharge: 2017-11-29 | Disposition: A | Discharge status: Home / Self Care | Payer: Medicaid Other | Attending: Emergency Medicine | Admitting: Emergency Medicine

## 2017-11-29 DIAGNOSIS — J449 Chronic obstructive pulmonary disease, unspecified: Secondary | ICD-10-CM | POA: Diagnosis not present

## 2017-11-29 DIAGNOSIS — K7460 Unspecified cirrhosis of liver: Secondary | ICD-10-CM | POA: Insufficient documentation

## 2017-11-29 DIAGNOSIS — E871 Hypo-osmolality and hyponatremia: Secondary | ICD-10-CM | POA: Diagnosis not present

## 2017-11-29 DIAGNOSIS — R404 Transient alteration of awareness: Secondary | ICD-10-CM | POA: Insufficient documentation

## 2017-11-29 DIAGNOSIS — Z87891 Personal history of nicotine dependence: Secondary | ICD-10-CM | POA: Insufficient documentation

## 2017-11-29 DIAGNOSIS — Z79899 Other long term (current) drug therapy: Secondary | ICD-10-CM | POA: Diagnosis not present

## 2017-11-29 DIAGNOSIS — E860 Dehydration: Secondary | ICD-10-CM | POA: Insufficient documentation

## 2017-11-29 DIAGNOSIS — I10 Essential (primary) hypertension: Secondary | ICD-10-CM | POA: Diagnosis not present

## 2017-11-29 DIAGNOSIS — R410 Disorientation, unspecified: Secondary | ICD-10-CM | POA: Diagnosis present

## 2017-11-29 DIAGNOSIS — N289 Disorder of kidney and ureter, unspecified: Secondary | ICD-10-CM

## 2017-11-29 LAB — CBC WITH DIFFERENTIAL/PLATELET
BASOS PCT: 0 %
Basophils Absolute: 0 10*3/uL (ref 0.0–0.1)
EOS ABS: 0.1 10*3/uL (ref 0.0–0.7)
EOS PCT: 1 %
HCT: 27.6 % — ABNORMAL LOW (ref 36.0–46.0)
Hemoglobin: 9.7 g/dL — ABNORMAL LOW (ref 12.0–15.0)
LYMPHS ABS: 0.6 10*3/uL — AB (ref 0.7–4.0)
Lymphocytes Relative: 11 %
MCH: 30.3 pg (ref 26.0–34.0)
MCHC: 35.1 g/dL (ref 30.0–36.0)
MCV: 86.3 fL (ref 78.0–100.0)
MONOS PCT: 13 %
Monocytes Absolute: 0.7 10*3/uL (ref 0.1–1.0)
NEUTROS PCT: 75 %
Neutro Abs: 3.7 10*3/uL (ref 1.7–7.7)
PLATELETS: 69 10*3/uL — AB (ref 150–400)
RBC: 3.2 MIL/uL — ABNORMAL LOW (ref 3.87–5.11)
RDW: 16.5 % — AB (ref 11.5–15.5)
WBC: 5 10*3/uL (ref 4.0–10.5)

## 2017-11-29 LAB — PATHOLOGIST SMEAR REVIEW

## 2017-11-29 LAB — COMPREHENSIVE METABOLIC PANEL
ALK PHOS: 169 U/L — AB (ref 38–126)
ALT: 31 U/L (ref 0–44)
AST: 56 U/L — AB (ref 15–41)
Albumin: 2.7 g/dL — ABNORMAL LOW (ref 3.5–5.0)
Anion gap: 10 (ref 5–15)
BUN: 43 mg/dL — AB (ref 6–20)
CALCIUM: 9 mg/dL (ref 8.9–10.3)
CO2: 26 mmol/L (ref 22–32)
CREATININE: 1.62 mg/dL — AB (ref 0.44–1.00)
Chloride: 83 mmol/L — ABNORMAL LOW (ref 98–111)
GFR, EST AFRICAN AMERICAN: 41 mL/min — AB (ref >60)
GFR, EST NON AFRICAN AMERICAN: 36 mL/min — AB (ref >60)
Glucose, Bld: 172 mg/dL — ABNORMAL HIGH (ref 70–99)
Potassium: 5 mmol/L (ref 3.5–5.1)
SODIUM: 119 mmol/L — AB (ref 135–145)
Total Bilirubin: 3.8 mg/dL — ABNORMAL HIGH (ref 0.3–1.2)
Total Protein: 5.6 g/dL — ABNORMAL LOW (ref 6.5–8.1)

## 2017-11-29 LAB — AMMONIA: Ammonia: 16 umol/L (ref 9–35)

## 2017-11-29 MED ORDER — FUROSEMIDE 40 MG PO TABS: 80.0000 mg | ORAL_TABLET | Freq: Three times a day (TID) | ORAL | 0 refills | Status: DC

## 2017-11-29 NOTE — ED Provider Notes (Signed)
[facility]-EMERGENCY DEPT Provider Note   CSN: [id number] Arrival date & time: 11/29/17  1946     History   Chief Complaint Chief Complaint  Patient presents with  . Dizziness    HPI Katrina Cohen is a 52 y.o. female.  HPI   She is here from home for evaluation of confusion.  She came by EMS.  She reports that she was talking to her brother on the phone and began talking about eating hamburgers, while they were not talking about food.  Because of that she called EMS and came here for evaluation.  She is taking her usual medicine including lactulose, for hepatic encephalopathy.  She recently had her Lasix dose increased to 80 mg twice daily.  She is also on spironolactone 50 mg twice daily.  She had abdominal paracentesis yesterday for ascites.  She has been receiving recurrent every 3 to 4-day paracentesis, which are therapeutic.  She also recently had screening for spontaneous bacterial peritonitis.  She states that she is trying to get on the liver transplant list.  She denies recent fever, chills, nausea, vomiting, diarrhea, dysuria, focal weakness or paresthesia.  There are no other no modifying factors.    Past Medical History:  Diagnosis Date  . Bipolar 2 disorder (HCC)   . Bipolar disorder (HCC) 1980s   with depression, anxiety  . Cirrhosis (HCC)   . Depression   . Depression with anxiety 1980s   [facility] admission 05/2011.    . Diabetes mellitus   . Dysphagia 11/2014   normal esophagram 11/2014. EGD per Dr Dulce Sellar.   . Hepatitis   . Hypertension   . Jaundice 04/2015   In setting of infectious mononucleosis. Ultrasound in [place]: hepatosplenomegaly, portal htn, possible cirrhosis  . Liver disease   . Mononucleosis 04/15/2015    Patient Active Problem List   Diagnosis Date Noted  . Ascites due to alcoholic cirrhosis (HCC) 11/13/2017  . Cirrhosis of liver with ascites (HCC) 10/27/2017  . Encephalopathy, hepatic (HCC)   . Acute encephalopathy  01/18/2017  . Right upper quadrant abdominal pain 04/02/2016  . UTI (urinary tract infection) 04/02/2016  . Cholelithiasis 04/02/2016  . Depression with anxiety 04/02/2016  . Chronic pain 04/02/2016  . SOB (shortness of breath) 11/12/2015  . COPD exacerbation (HCC) 11/12/2015  . Acute respiratory failure with hypoxia (HCC) 11/12/2015  . CAP (community acquired pneumonia) 11/07/2015  . Cirrhosis (HCC) 11/07/2015  . Thrombocytopenia (HCC) 11/07/2015  . Alcohol abuse 04/30/2015  . Acute hepatitis 04/30/2015  . Tobacco abuse 04/30/2015  . Malnutrition of moderate degree 04/29/2015  . Hypokalemia 04/29/2015  . Anemia 04/29/2015  . Hyponatremia 04/29/2015  . Coagulopathy (HCC) 04/29/2015  . Hypoalbuminemia 04/29/2015  . Mononucleosis 04/28/2015  . Vitamin D deficiency 06/11/2011  . Major depression (HCC) 06/10/2011    Past Surgical History:  Procedure Laterality Date  . APPENDECTOMY    . CESAREAN SECTION    . COLONOSCOPY    . ESOPHAGOGASTRODUODENOSCOPY     Dr Dulce Sellar ? 2016.   Gerald Leitz GENERIC HISTORICAL  04/20/2016   IR PARACENTESIS 04/20/2016 Darrell K Allred, PA-C [facility]-INTERV RAD  . IR PARACENTESIS  03/23/2017  . IR PARACENTESIS  04/08/2017  . IR PARACENTESIS  07/18/2017  . IR PARACENTESIS  09/07/2017  . IR PARACENTESIS  09/14/2017  . IR PARACENTESIS  09/23/2017  . IR PARACENTESIS  10/06/2017  . IR PARACENTESIS  10/14/2017  . IR PARACENTESIS  10/21/2017  . IR PARACENTESIS  11/02/2017  .  IR PARACENTESIS  11/08/2017  . IR PARACENTESIS  11/22/2017  . IR PARACENTESIS  11/25/2017  . IR PARACENTESIS  11/28/2017     OB History   None      Home Medications    Prior to Admission medications   Medication Sig Start Date End Date Taking? Authorizing Provider  ARIPiprazole (ABILIFY) 5 MG tablet Take 5 mg by mouth at bedtime.  04/02/15  Yes [provider]  ciprofloxacin (CIPRO) 500 MG tablet Take 500 mg by mouth daily. 11/15/17  Yes [provider]  citalopram (CELEXA) 20 MG  tablet TAKE 1 TABLET BY MOUTH EVERY NIGHT 12/20/16  Yes [provider]  lactulose (CHRONULAC) 10 GM/15ML solution Take 10 mg/15 ml po BID 11/22/17  Yes [provider]  morphine (MS CONTIN) 30 MG 12 hr tablet Take 30 mg by mouth daily.   Yes [provider]  pioglitazone (ACTOS) 30 MG tablet Take 30 mg by mouth daily.   Yes [provider]  spironolactone (ALDACTONE) 50 MG tablet Take 2 tablets (100 mg total) by mouth 2 (two) times daily. 10/28/17  Yes Auburn Bilberry, MD  torsemide (DEMADEX) 20 MG tablet TAKE 2 TABLETS (40 MG TOTAL) BY MOUTH ONCE DAILY 11/23/17  Yes [provider]  traZODone (DESYREL) 50 MG tablet Take 50 mg by mouth at bedtime.   Yes [provider]  XIFAXAN 550 MG TABS tablet Take 1 tablet by mouth 2 (two) times daily. 06/10/17  Yes [provider]  albuterol (PROVENTIL) (2.5 MG/3ML) 0.083% nebulizer solution Take 3 mLs (2.5 mg total) by nebulization every 4 (four) hours as needed for wheezing or shortness of breath. 11/13/15   Standley Brooking, MD  furosemide (LASIX) 40 MG tablet Take 2 tablets (80 mg total) by mouth 3 (three) times daily. 11/29/17   Mancel Bale, MD  ondansetron (ZOFRAN ODT) 4 MG disintegrating tablet Take 1 tablet (4 mg total) by mouth every 8 (eight) hours as needed for nausea or vomiting. 01/07/17   Dorena Bodo, NP  PROAIR HFA 108 (90 Base) MCG/ACT inhaler INHALE 2 PUFFS INTO THE LUNGS EVERY 6 HOURS AS NEEDED FOR SHORTNESS OF BREATH 11/09/17   [provider]    Family History Family History  Problem Relation Age of Onset  . Diabetes Mother   . Cancer Father   . Diabetes Sister   . Diabetes Brother     Social History Social History   Tobacco Use  . Smoking status: Former Smoker    Packs/day: 0.50    Types: Cigarettes  . Smokeless tobacco: Never Used  Substance Use Topics  . Alcohol use: Not Currently  . Drug use: No     Allergies   Tizanidine   Review of  Systems Review of Systems  All other systems reviewed and are negative.    Physical Exam Updated Vital Signs BP (!) 101/52   Pulse 76   Temp (!) 97.5 F (36.4 C) (Oral)   Resp 16   Ht 5\' 4"  (1.626 m)   Wt 82.6 kg (182 lb)   LMP 01/29/2013   SpO2 98%   BMI 31.24 kg/m   Physical Exam  Constitutional: She is oriented to person, place, and time. She appears well-developed and well-nourished.  HENT:  Head: Normocephalic and atraumatic.  Right Ear: External ear normal.  Left Ear: External ear normal.  Eyes: Pupils are equal, round, and reactive to light. Conjunctivae and EOM are normal.  Neck: Normal range of motion and phonation normal.  Neck supple.  Cardiovascular: Normal rate, regular rhythm and normal heart sounds.  Pulmonary/Chest: Effort normal and breath sounds normal. She exhibits no bony tenderness.  Abdominal: There is no tenderness.  Marked abdominal distention consistent with ascites.  No areas of focal tenderness in the abdominal cavity.  Musculoskeletal: Normal range of motion.  Minimal lower extremity edema bilaterally  Neurological: She is alert and oriented to person, place, and time. No cranial nerve deficit or sensory deficit. She exhibits normal muscle tone. Coordination normal.  Skin: Skin is warm, dry and intact.  Psychiatric: She has a normal mood and affect. Her behavior is normal. Judgment and thought content normal.  Nursing note and vitals reviewed.    ED Treatments / Results  Labs (all labs ordered are listed, but only abnormal results are displayed) Labs Reviewed  COMPREHENSIVE METABOLIC PANEL - Abnormal; Notable for the following components:      Result Value   Sodium 119 (*)    Chloride 83 (*)    Glucose, Bld 172 (*)    BUN 43 (*)    Creatinine, Ser 1.62 (*)    Total Protein 5.6 (*)    Albumin 2.7 (*)    AST 56 (*)    Alkaline Phosphatase 169 (*)    Total Bilirubin 3.8 (*)    GFR calc non Af Amer 36 (*)    GFR calc Af Amer 41 (*)     All other components within normal limits  CBC WITH DIFFERENTIAL/PLATELET - Abnormal; Notable for the following components:   RBC 3.20 (*)    Hemoglobin 9.7 (*)    HCT 27.6 (*)    RDW 16.5 (*)    Platelets 69 (*)    Lymphs Abs 0.6 (*)    All other components within normal limits  AMMONIA    EKG None  Radiology Ir Paracentesis  Result Date: 11/28/2017 INDICATION: Patient with recurrent ascites. Request is made for diagnostic and therapeutic paracentesis. Patient to receive albumin postprocedure. EXAM: ULTRASOUND GUIDED DIAGNOSTIC AND THERAPEUTIC PARACENTESIS MEDICATIONS: 10 mL 2% lidocaine COMPLICATIONS: None immediate. PROCEDURE: Informed written consent was obtained from the patient after a discussion of the risks, benefits and alternatives to treatment. A timeout was performed prior to the initiation of the procedure. Initial ultrasound scanning demonstrates a large amount of ascites within the right lower abdominal quadrant. The right lower abdomen was prepped and draped in the usual sterile fashion. 2% lidocaine was used for local anesthesia. Following this, a 19 gauge, 7-cm, Yueh catheter was introduced. An ultrasound image was saved for documentation purposes. The paracentesis was performed. The catheter was removed and a dressing was applied. The patient tolerated the procedure well without immediate post procedural complication. FINDINGS: A total of approximately 5.8 liters of yellow fluid was removed. Samples were sent to the laboratory as requested by the clinical team. IMPRESSION: Successful ultrasound-guided diagnostic and therapeutic paracentesis yielding 5.8 liters of peritoneal fluid. Read by: Loyce Dys PA-C Electronically Signed   By: Simonne Come M.D.   On: 11/28/2017 14:21    Procedures Procedures (including critical care time)  Medications Ordered in ED Medications - No data to display   Initial Impression / Assessment and Plan / ED Course  I have reviewed the  triage vital signs and the nursing notes.  Pertinent labs & imaging results that were available during my care of the patient were reviewed by me and considered in my medical decision making (see chart for details).  Clinical Course as of Nov 29 2300  Tue Nov 29, 2017  2209 Normal except hemoglobin low  CBC with Differential(!) [EW]  2209 Normal except sodium low, chloride low, glucose high, BUN high, creatinine high, total protein low, albumin low, AST high, stays high, total bilirubin high, GFR low  Comprehensive metabolic panel(!!) [EW]  2211 Normal  Ammonia [EW]    Clinical Course User Index [EW] Mancel BaleWentz, Adiah Guereca, MD     Patient Vitals for the past 24 hrs:  BP Temp Temp src Pulse Resp SpO2 Height Weight  11/29/17 2238 (!) 101/52 - - 76 16 98 % - -  11/29/17 2027 - - - - - - 5\' 4"  (1.626 m) 82.6 kg (182 lb)  11/29/17 2021 - - - - - 98 % - -  11/29/17 1958 (!) 102/51 (!) 97.5 F (36.4 C) Oral 74 18 100 % - -    10:50 PM Reevaluation with update and discussion. After initial assessment and treatment, an updated evaluation reveals she is comfortable.  Her son is here.  Findings discussed with patient and son, all questions answered. Mancel BaleElliott Kemaria Dedic   Medical Decision Making: Nonspecific confusion apparently transient, improved spontaneously.  Screening evaluation indicates mildly worsening intravenous volume, likely related to recent increase of Lasix dosing from 120 to160 mg each day.  Patient is scheduled for outpatient paracentesis, on 12/02/2017.  No indication for spontaneous bacterial peritonitis at this time.  Doubt acute CVA, hepatic encephalopathy, serious bacterial infection or metabolic instability.   CRITICAL CARE-no Performed by: Mancel BaleElliott Rohail Klees   Nursing Notes Reviewed/ Care Coordinated Applicable Imaging Reviewed Interpretation of Laboratory Data incorporated into ED treatment  The patient appears reasonably screened and/or stabilized for discharge and I doubt any other  medical condition or other [facility] requiring further screening, evaluation, or treatment in the ED at this time prior to discharge.  Plan: Home Medications-decrease Lasix dosing to 40 mg 3 times daily, continue other medicines as prescribed; Home Treatments-continue low-salt diet, and usual treatments including planned paracentesis; return here if the recommended treatment, does not improve the symptoms; Recommended follow up-PCP and GI follow-up PRN   Final Clinical Impressions(s) / ED Diagnoses   Final diagnoses:  Transient alteration of awareness  Dehydration  Renal insufficiency  Hyponatremia    ED Discharge Orders        Ordered    furosemide (LASIX) 40 MG tablet  3 times daily     11/29/17 2257       Mancel BaleWentz, Cashmere Harmes, MD 11/29/17 2302

## 2017-11-29 NOTE — ED Notes (Signed)
Pt ambulating to the restroom with no difficulty.

## 2017-11-29 NOTE — ED Triage Notes (Signed)
Pt arrived via [facility] due to abdominal pain and confusion, pt has a hx of liver cirrhosis, family states last time this happened her ammonia levels were elevated. Pt had 5.5 livers removed Friday and 6 liters removed Monday.

## 2017-11-29 NOTE — Discharge Instructions (Signed)
We are changing her Lasix dose back to 40 mg 3 times a day.  Stay on a low-salt diet to help your abdominal swelling.  Follow-up for the paracentesis on July 12 as scheduled.

## 2017-11-29 NOTE — ED Notes (Signed)
Bed: [id number] Expected date:  Expected time:  Means of arrival:  Comments: EMS 52 yo female from home/dizziness-hx cirrhosis-abd distension

## 2017-11-30 ENCOUNTER — Other Ambulatory Visit: Payer: Self-pay

## 2017-11-30 ENCOUNTER — Inpatient Hospital Stay
Admission: EM | Admit: 2017-11-30 | Discharge: 2017-12-04 | DRG: 640 | Disposition: A | Discharge status: Other Acute Inpatient Hospital | Payer: Medicaid Other | Attending: Internal Medicine | Admitting: Internal Medicine

## 2017-11-30 ENCOUNTER — Encounter: Payer: Self-pay | Admitting: Emergency Medicine

## 2017-11-30 DIAGNOSIS — E860 Dehydration: Secondary | ICD-10-CM | POA: Diagnosis present

## 2017-11-30 DIAGNOSIS — G8929 Other chronic pain: Secondary | ICD-10-CM | POA: Diagnosis present

## 2017-11-30 DIAGNOSIS — E871 Hypo-osmolality and hyponatremia: Principal | ICD-10-CM | POA: Diagnosis present

## 2017-11-30 DIAGNOSIS — Z833 Family history of diabetes mellitus: Secondary | ICD-10-CM | POA: Diagnosis not present

## 2017-11-30 DIAGNOSIS — R601 Generalized edema: Secondary | ICD-10-CM | POA: Diagnosis present

## 2017-11-30 DIAGNOSIS — F418 Other specified anxiety disorders: Secondary | ICD-10-CM | POA: Diagnosis present

## 2017-11-30 DIAGNOSIS — Z87891 Personal history of nicotine dependence: Secondary | ICD-10-CM | POA: Diagnosis not present

## 2017-11-30 DIAGNOSIS — F3181 Bipolar II disorder: Secondary | ICD-10-CM | POA: Diagnosis present

## 2017-11-30 DIAGNOSIS — R188 Other ascites: Secondary | ICD-10-CM

## 2017-11-30 DIAGNOSIS — D696 Thrombocytopenia, unspecified: Secondary | ICD-10-CM | POA: Diagnosis present

## 2017-11-30 DIAGNOSIS — K7290 Hepatic failure, unspecified without coma: Secondary | ICD-10-CM | POA: Diagnosis present

## 2017-11-30 DIAGNOSIS — K766 Portal hypertension: Secondary | ICD-10-CM | POA: Diagnosis present

## 2017-11-30 DIAGNOSIS — E119 Type 2 diabetes mellitus without complications: Secondary | ICD-10-CM | POA: Diagnosis present

## 2017-11-30 DIAGNOSIS — Z809 Family history of malignant neoplasm, unspecified: Secondary | ICD-10-CM | POA: Diagnosis not present

## 2017-11-30 DIAGNOSIS — I10 Essential (primary) hypertension: Secondary | ICD-10-CM | POA: Diagnosis present

## 2017-11-30 DIAGNOSIS — N179 Acute kidney failure, unspecified: Secondary | ICD-10-CM | POA: Diagnosis present

## 2017-11-30 DIAGNOSIS — F419 Anxiety disorder, unspecified: Secondary | ICD-10-CM | POA: Diagnosis present

## 2017-11-30 DIAGNOSIS — K7031 Alcoholic cirrhosis of liver with ascites: Secondary | ICD-10-CM | POA: Diagnosis present

## 2017-11-30 DIAGNOSIS — R531 Weakness: Secondary | ICD-10-CM

## 2017-11-30 DIAGNOSIS — Z7682 Awaiting organ transplant status: Secondary | ICD-10-CM

## 2017-11-30 DIAGNOSIS — Z888 Allergy status to other drugs, medicaments and biological substances status: Secondary | ICD-10-CM

## 2017-11-30 DIAGNOSIS — R1310 Dysphagia, unspecified: Secondary | ICD-10-CM | POA: Diagnosis present

## 2017-11-30 DIAGNOSIS — K767 Hepatorenal syndrome: Secondary | ICD-10-CM | POA: Diagnosis present

## 2017-11-30 DIAGNOSIS — F1010 Alcohol abuse, uncomplicated: Secondary | ICD-10-CM | POA: Diagnosis present

## 2017-11-30 DIAGNOSIS — Z79899 Other long term (current) drug therapy: Secondary | ICD-10-CM

## 2017-11-30 DIAGNOSIS — R6881 Early satiety: Secondary | ICD-10-CM | POA: Diagnosis present

## 2017-11-30 DIAGNOSIS — R600 Localized edema: Secondary | ICD-10-CM | POA: Diagnosis present

## 2017-11-30 LAB — CBC
HEMATOCRIT: 31.5 % — AB (ref 35.0–47.0)
HEMOGLOBIN: 10.9 g/dL — AB (ref 12.0–16.0)
MCH: 31.2 pg (ref 26.0–34.0)
MCHC: 34.8 g/dL (ref 32.0–36.0)
MCV: 89.8 fL (ref 80.0–100.0)
Platelets: 80 10*3/uL — ABNORMAL LOW (ref 150–440)
RBC: 3.5 MIL/uL — AB (ref 3.80–5.20)
RDW: 17 % — ABNORMAL HIGH (ref 11.5–14.5)
WBC: 5.3 10*3/uL (ref 3.6–11.0)

## 2017-11-30 LAB — COMPREHENSIVE METABOLIC PANEL
ALT: 33 U/L (ref 0–44)
ANION GAP: 9 (ref 5–15)
AST: 68 U/L — ABNORMAL HIGH (ref 15–41)
Albumin: 3.1 g/dL — ABNORMAL LOW (ref 3.5–5.0)
Alkaline Phosphatase: 170 U/L — ABNORMAL HIGH (ref 38–126)
BILIRUBIN TOTAL: 4.4 mg/dL — AB (ref 0.3–1.2)
BUN: 44 mg/dL — ABNORMAL HIGH (ref 6–20)
CHLORIDE: 82 mmol/L — AB (ref 98–111)
CO2: 26 mmol/L (ref 22–32)
Calcium: 9.3 mg/dL (ref 8.9–10.3)
Creatinine, Ser: 1.8 mg/dL — ABNORMAL HIGH (ref 0.44–1.00)
GFR, EST AFRICAN AMERICAN: 36 mL/min — AB (ref >60)
GFR, EST NON AFRICAN AMERICAN: 31 mL/min — AB (ref >60)
Glucose, Bld: 203 mg/dL — ABNORMAL HIGH (ref 70–99)
POTASSIUM: 4.6 mmol/L (ref 3.5–5.1)
Sodium: 117 mmol/L — CL (ref 135–145)
TOTAL PROTEIN: 6.4 g/dL — AB (ref 6.5–8.1)

## 2017-11-30 LAB — URINALYSIS, COMPLETE (UACMP) WITH MICROSCOPIC
BILIRUBIN URINE: NEGATIVE
GLUCOSE, UA: NEGATIVE mg/dL
Hgb urine dipstick: NEGATIVE
Ketones, ur: NEGATIVE mg/dL
LEUKOCYTES UA: NEGATIVE
NITRITE: NEGATIVE
PROTEIN: NEGATIVE mg/dL
Specific Gravity, Urine: 1.009 (ref 1.005–1.030)
pH: 5 (ref 5.0–8.0)

## 2017-11-30 LAB — AMMONIA: Ammonia: <9 umol/L — ABNORMAL LOW (ref 9–35)

## 2017-11-30 LAB — PROTIME-INR
INR: 1.51
PROTHROMBIN TIME: 18.1 s — AB (ref 11.4–15.2)

## 2017-11-30 MED ORDER — ONDANSETRON HCL 4 MG/2ML IJ SOLN: 4.0000 mg | Freq: Four times a day (QID) | INTRAMUSCULAR | Status: DC | PRN

## 2017-11-30 MED ORDER — LACTULOSE 10 GM/15ML PO SOLN
30.0000 g | Freq: Two times a day (BID) | ORAL | Status: DC
  Administered 2017-11-30 – 2017-12-04 (×8): 30 g via ORAL
  Filled 2017-11-30 (×8): qty 60

## 2017-11-30 MED ORDER — ACETAMINOPHEN 650 MG RE SUPP: 650.0000 mg | Freq: Four times a day (QID) | RECTAL | Status: DC | PRN

## 2017-11-30 MED ORDER — CIPROFLOXACIN HCL 500 MG PO TABS
500.0000 mg | ORAL_TABLET | Freq: Every day | ORAL | Status: DC
  Administered 2017-11-30 – 2017-12-04 (×5): 500 mg via ORAL
  Filled 2017-11-30 (×5): qty 1

## 2017-11-30 MED ORDER — TRAMADOL HCL 50 MG PO TABS
50.0000 mg | ORAL_TABLET | Freq: Two times a day (BID) | ORAL | Status: DC | PRN
  Administered 2017-11-30 – 2017-12-01 (×2): 50 mg via ORAL
  Filled 2017-11-30 (×2): qty 1

## 2017-11-30 MED ORDER — SODIUM CHLORIDE 0.9 % IV SOLN
INTRAVENOUS | Status: DC
  Administered 2017-11-30 – 2017-12-04 (×7): via INTRAVENOUS

## 2017-11-30 MED ORDER — ACETAMINOPHEN 325 MG PO TABS
650.0000 mg | ORAL_TABLET | Freq: Four times a day (QID) | ORAL | Status: DC | PRN
  Administered 2017-12-01 – 2017-12-02 (×2): 650 mg via ORAL
  Filled 2017-11-30 (×2): qty 2

## 2017-11-30 MED ORDER — ARIPIPRAZOLE 5 MG PO TABS
5.0000 mg | ORAL_TABLET | Freq: Every day | ORAL | Status: DC
  Administered 2017-11-30 – 2017-12-03 (×4): 5 mg via ORAL
  Filled 2017-11-30 (×5): qty 1

## 2017-11-30 MED ORDER — RIFAXIMIN 550 MG PO TABS
550.0000 mg | ORAL_TABLET | Freq: Two times a day (BID) | ORAL | Status: DC
  Administered 2017-11-30 – 2017-12-04 (×8): 550 mg via ORAL
  Filled 2017-11-30 (×9): qty 1

## 2017-11-30 MED ORDER — ALBUTEROL SULFATE (2.5 MG/3ML) 0.083% IN NEBU: 2.5000 mg | INHALATION_SOLUTION | RESPIRATORY_TRACT | Status: DC | PRN

## 2017-11-30 MED ORDER — TRAZODONE HCL 50 MG PO TABS
50.0000 mg | ORAL_TABLET | Freq: Every day | ORAL | Status: DC
  Administered 2017-11-30 – 2017-12-03 (×4): 50 mg via ORAL
  Filled 2017-11-30 (×4): qty 1

## 2017-11-30 MED ORDER — CITALOPRAM HYDROBROMIDE 20 MG PO TABS
20.0000 mg | ORAL_TABLET | Freq: Every day | ORAL | Status: DC
  Administered 2017-11-30 – 2017-12-03 (×4): 20 mg via ORAL
  Filled 2017-11-30 (×4): qty 1

## 2017-11-30 MED ORDER — ONDANSETRON HCL 4 MG PO TABS
4.0000 mg | ORAL_TABLET | Freq: Four times a day (QID) | ORAL | Status: DC | PRN
Start: 2017-11-30 — End: 2017-12-04

## 2017-11-30 MED ORDER — MORPHINE SULFATE ER 15 MG PO TBCR
30.0000 mg | EXTENDED_RELEASE_TABLET | Freq: Every day | ORAL | Status: DC
  Administered 2017-12-01 – 2017-12-04 (×4): 30 mg via ORAL
  Filled 2017-11-30 (×5): qty 2

## 2017-11-30 NOTE — ED Notes (Signed)
Discussed critical results with Consulting civil engineerCharge RN.

## 2017-11-30 NOTE — ED Triage Notes (Signed)
[facility], reports sodium level was low last night. Pt seen at PCP today and told to come here for evaluation for low sodium. Pt with hx of cirrhosis, was told to stop taking lasix for now.

## 2017-11-30 NOTE — ED Notes (Signed)
primedoc in with pt for admission.  Iv in place by dr Lamont Snowballrifenbark.  Tolerated well.

## 2017-11-30 NOTE — ED Provider Notes (Signed)
[facility] Emergency Department Provider Note  ____________________________________________   First MD Initiated Contact with Patient 11/30/17 1656     (approximate)  I have reviewed the triage vital signs and the nursing notes.   HISTORY  Chief Complaint Weakness    HPI Zaiyah Sottile is a 52 y.o. female who comes to the emergency department with generalized malaise fatigue and confusion for the past several days.  She is a past medical history of cirrhosis and recently increased her home Lasix.  Her symptoms have been insidious onset slowly progressive are now moderate severity.  Nothing seems to make them better or worse.     Past Medical History:  Diagnosis Date  . Bipolar 2 disorder (HCC)   . Bipolar disorder (HCC) 1980s   with depression, anxiety  . Cirrhosis (HCC)   . Depression   . Depression with anxiety 1980s   [facility] admission 05/2011.    . Diabetes mellitus   . Dysphagia 11/2014   normal esophagram 11/2014. EGD per Dr Dulce Sellar.   . Hepatitis   . Hypertension   . Jaundice 04/2015   In setting of infectious mononucleosis. Ultrasound in [place]: hepatosplenomegaly, portal htn, possible cirrhosis  . Liver disease   . Mononucleosis 04/15/2015    Patient Active Problem List   Diagnosis Date Noted  . Ascites due to alcoholic cirrhosis (HCC) 11/13/2017  . Cirrhosis of liver with ascites (HCC) 10/27/2017  . Encephalopathy, hepatic (HCC)   . Acute encephalopathy 01/18/2017  . Right upper quadrant abdominal pain 04/02/2016  . UTI (urinary tract infection) 04/02/2016  . Cholelithiasis 04/02/2016  . Depression with anxiety 04/02/2016  . Chronic pain 04/02/2016  . SOB (shortness of breath) 11/12/2015  . COPD exacerbation (HCC) 11/12/2015  . Acute respiratory failure with hypoxia (HCC) 11/12/2015  . CAP (community acquired pneumonia) 11/07/2015  . Cirrhosis (HCC) 11/07/2015  . Thrombocytopenia (HCC) 11/07/2015  . Alcohol abuse  04/30/2015  . Acute hepatitis 04/30/2015  . Tobacco abuse 04/30/2015  . Malnutrition of moderate degree 04/29/2015  . Hypokalemia 04/29/2015  . Anemia 04/29/2015  . Hyponatremia 04/29/2015  . Coagulopathy (HCC) 04/29/2015  . Hypoalbuminemia 04/29/2015  . Mononucleosis 04/28/2015  . Vitamin D deficiency 06/11/2011  . Major depression (HCC) 06/10/2011    Past Surgical History:  Procedure Laterality Date  . APPENDECTOMY    . CESAREAN SECTION    . COLONOSCOPY    . ESOPHAGOGASTRODUODENOSCOPY     Dr Dulce Sellar ? 2016.   Gerald Leitz GENERIC HISTORICAL  04/20/2016   IR PARACENTESIS 04/20/2016 Darrell K Allred, PA-C [facility]-INTERV RAD  . IR PARACENTESIS  03/23/2017  . IR PARACENTESIS  04/08/2017  . IR PARACENTESIS  07/18/2017  . IR PARACENTESIS  09/07/2017  . IR PARACENTESIS  09/14/2017  . IR PARACENTESIS  09/23/2017  . IR PARACENTESIS  10/06/2017  . IR PARACENTESIS  10/14/2017  . IR PARACENTESIS  10/21/2017  . IR PARACENTESIS  11/02/2017  . IR PARACENTESIS  11/08/2017  . IR PARACENTESIS  11/22/2017  . IR PARACENTESIS  11/25/2017  . IR PARACENTESIS  11/28/2017    Prior to Admission medications   Medication Sig Start Date End Date Taking? Authorizing Provider  albuterol (PROVENTIL) (2.5 MG/3ML) 0.083% nebulizer solution Take 3 mLs (2.5 mg total) by nebulization every 4 (four) hours as needed for wheezing or shortness of breath. 11/13/15  Yes Standley Brooking, MD  ARIPiprazole (ABILIFY) 5 MG tablet Take 5 mg by mouth at bedtime.  04/02/15  Yes [provider]  ciprofloxacin (CIPRO) 500 MG tablet Take 500 mg by mouth daily. 11/15/17  Yes [provider]  citalopram (CELEXA) 20 MG tablet TAKE 1 TABLET BY MOUTH EVERY NIGHT 12/20/16  Yes [provider]  lactulose (CHRONULAC) 10 GM/15ML solution Take 30 ml by mouth twice daily. 11/22/17  Yes [provider]  morphine (MS CONTIN) 30 MG 12 hr tablet Take 30 mg by mouth daily.   Yes [provider]  ondansetron (ZOFRAN ODT) 4 MG  disintegrating tablet Take 1 tablet (4 mg total) by mouth every 8 (eight) hours as needed for nausea or vomiting. 01/07/17  Yes Dorena Bodo, NP  pioglitazone (ACTOS) 30 MG tablet Take 30 mg by mouth daily.   Yes [provider]  PROAIR HFA 108 (90 Base) MCG/ACT inhaler INHALE 2 PUFFS INTO THE LUNGS EVERY 6 HOURS AS NEEDED FOR SHORTNESS OF BREATH 11/09/17  Yes [provider]  spironolactone (ALDACTONE) 50 MG tablet Take 2 tablets (100 mg total) by mouth 2 (two) times daily. 10/28/17  Yes Auburn Bilberry, MD  traZODone (DESYREL) 50 MG tablet Take 50 mg by mouth at bedtime.   Yes [provider]  XIFAXAN 550 MG TABS tablet Take 1 tablet by mouth 2 (two) times daily. 06/10/17  Yes [provider]  furosemide (LASIX) 40 MG tablet Take 2 tablets (80 mg total) by mouth 3 (three) times daily. Patient not taking: Reported on 11/30/2017 11/29/17   Mancel Bale, MD    Allergies Tizanidine  Family History  Problem Relation Age of Onset  . Diabetes Mother   . Cancer Father   . Diabetes Sister   . Diabetes Brother     Social History Social History   Tobacco Use  . Smoking status: Former Smoker    Packs/day: 0.50    Types: Cigarettes  . Smokeless tobacco: Never Used  Substance Use Topics  . Alcohol use: Not Currently  . Drug use: No    Review of Systems Constitutional: No fever/chills Eyes: No visual changes. ENT: No sore throat. Cardiovascular: Denies chest pain. Respiratory: Denies shortness of breath. Gastrointestinal: No abdominal pain.  Positive for nausea, no vomiting.  No diarrhea.  No constipation. Genitourinary: Negative for dysuria. Musculoskeletal: Negative for back pain. Skin: Negative for rash. Neurological: Negative for headaches, focal weakness or numbness.   ____________________________________________   PHYSICAL EXAM:  VITAL SIGNS: ED Triage Vitals [11/30/17 1545]  Enc Vitals Group     BP (!) 101/45     Pulse Rate 81      Resp 17     Temp (!) 97.5 F (36.4 C)     Temp Source Oral     SpO2 100 %     Weight 182 lb (82.6 kg)     Height 5\' 4"  (1.626 m)     Head Circumference      Peak Flow      Pain Score 0     Pain Loc      Pain Edu?      Excl. in GC?     Constitutional: Pleasant cooperative somewhat confused no diaphoresis Eyes: PERRL EOMI. Head: Atraumatic. Nose: No congestion/rhinnorhea. Mouth/Throat: No trismus Neck: No stridor.   Cardiovascular: Normal rate, regular rhythm. Grossly normal heart sounds.  Good peripheral circulation. Respiratory: Normal respiratory effort.  No retractions. Lungs CTAB and moving good air Gastrointestinal: Soft nontender Musculoskeletal: No lower extremity edema   Neurologic:  Normal speech and language. No gross focal neurologic deficits are appreciated. Skin:  Skin is warm,  dry and intact. No rash noted. Psychiatric: Possibly confused    ____________________________________________   DIFFERENTIAL includes but not limited to  Hyponatremia, dehydration, sepsis ____________________________________________   LABS (all labs ordered are listed, but only abnormal results are displayed)  Labs Reviewed  COMPREHENSIVE METABOLIC PANEL - Abnormal; Notable for the following components:      Result Value   Sodium 117 (*)    Chloride 82 (*)    Glucose, Bld 203 (*)    BUN 44 (*)    Creatinine, Ser 1.80 (*)    Total Protein 6.4 (*)    Albumin 3.1 (*)    AST 68 (*)    Alkaline Phosphatase 170 (*)    Total Bilirubin 4.4 (*)    GFR calc non Af Amer 31 (*)    GFR calc Af Amer 36 (*)    All other components within normal limits  CBC - Abnormal; Notable for the following components:   RBC 3.50 (*)    Hemoglobin 10.9 (*)    HCT 31.5 (*)    RDW 17.0 (*)    Platelets 80 (*)    All other components within normal limits  URINALYSIS, COMPLETE (UACMP) WITH MICROSCOPIC - Abnormal; Notable for the following components:   Color, Urine YELLOW (*)    APPearance CLEAR  (*)    Bacteria, UA RARE (*)    All other components within normal limits  PROTIME-INR - Abnormal; Notable for the following components:   Prothrombin Time 18.1 (*)    All other components within normal limits  AMMONIA - Abnormal; Notable for the following components:   Ammonia <9 (*)    All other components within normal limits  BASIC METABOLIC PANEL - Abnormal; Notable for the following components:   Sodium 119 (*)    Chloride 86 (*)    Glucose, Bld 138 (*)    BUN 47 (*)    Creatinine, Ser 1.89 (*)    GFR calc non Af Amer 29 (*)    GFR calc Af Amer 34 (*)    All other components within normal limits  CBC - Abnormal; Notable for the following components:   RBC 3.31 (*)    Hemoglobin 10.3 (*)    HCT 29.6 (*)    RDW 16.9 (*)    Platelets 82 (*)    All other components within normal limits  GLUCOSE, CAPILLARY - Abnormal; Notable for the following components:   Glucose-Capillary 139 (*)    All other components within normal limits  GLUCOSE, CAPILLARY - Abnormal; Notable for the following components:   Glucose-Capillary 146 (*)    All other components within normal limits  CBC - Abnormal; Notable for the following components:   RBC 2.76 (*)    Hemoglobin 8.7 (*)    HCT 24.8 (*)    RDW 16.8 (*)    Platelets 47 (*)    All other components within normal limits  BASIC METABOLIC PANEL - Abnormal; Notable for the following components:   Sodium 122 (*)    Chloride 90 (*)    CO2 21 (*)    Glucose, Bld 131 (*)    BUN 50 (*)    Creatinine, Ser 1.76 (*)    GFR calc non Af Amer 32 (*)    GFR calc Af Amer 37 (*)    All other components within normal limits  GLUCOSE, CAPILLARY - Abnormal; Notable for the following components:   Glucose-Capillary 119 (*)    All other components within normal  limits  GLUCOSE, CAPILLARY - Abnormal; Notable for the following components:   Glucose-Capillary 133 (*)    All other components within normal limits  BODY FLUID CELL COUNT WITH DIFFERENTIAL -  Abnormal; Notable for the following components:   Color, Fluid YELLOW (*)    Appearance, Fluid HAZY (*)    All other components within normal limits  GLUCOSE, CAPILLARY - Abnormal; Notable for the following components:   Glucose-Capillary 169 (*)    All other components within normal limits  GLUCOSE, CAPILLARY - Abnormal; Notable for the following components:   Glucose-Capillary 116 (*)    All other components within normal limits  GLUCOSE, CAPILLARY - Abnormal; Notable for the following components:   Glucose-Capillary 122 (*)    All other components within normal limits  BODY FLUID CULTURE  ALBUMIN, PLEURAL OR PERITONEAL FLUID  PROTEIN, PLEURAL OR PERITONEAL FLUID  PROTEIN, BODY FLUID (OTHER)  CBC WITH DIFFERENTIAL/PLATELET  BASIC METABOLIC PANEL  CYTOLOGY - NON PAP    Lab work reviewed by me with hyponatremia __________________________________________  EKG   ____________________________________________  RADIOLOGY   ____________________________________________   PROCEDURES  Procedure(s) performed: yes  Angiocath insertion Performed by: Merrily Brittle  Consent: Verbal consent obtained. Risks and benefits: risks, benefits and alternatives were discussed Time out: Immediately prior to procedure a "time out" was called to verify the correct patient, procedure, equipment, support staff and site/side marked as required.  Preparation: Patient was prepped and draped in the usual sterile fashion.  Vein Location: Right AC  Ultrasound Guided  Gauge: 20  Normal blood return and flush without difficulty Patient tolerance: Patient tolerated the procedure well with no immediate complications.     .Critical Care Performed by: Merrily Brittle, MD Authorized by: Merrily Brittle, MD   Critical care provider statement:    Critical care time (minutes):  30   Critical care time was exclusive of:  Separately billable procedures and treating other patients   Critical  care was necessary to treat or prevent imminent or life-threatening deterioration of the following conditions:  Metabolic crisis   Critical care was time spent personally by me on the following activities:  Development of treatment plan with patient or surrogate, discussions with consultants, evaluation of patient's response to treatment, examination of patient, obtaining history from patient or surrogate, ordering and performing treatments and interventions, ordering and review of laboratory studies, ordering and review of radiographic studies, pulse oximetry, re-evaluation of patient's condition and review of old charts    Critical Care performed: Yes  ____________________________________________   INITIAL IMPRESSION / ASSESSMENT AND PLAN / ED COURSE  Pertinent labs & imaging results that were available during my care of the patient were reviewed by me and considered in my medical decision making (see chart for details).   The patient arrives somewhat confused and fatigued.  Her lab work is concerning for acute worsening hyponatremia down to 117.  At this point given her significant metabolic derangement she requires inpatient admission for management.  She is likely actually under resuscitated and will hold off on Lasix now and begin fluid resuscitation.      ____________________________________________   FINAL CLINICAL IMPRESSION(S) / ED DIAGNOSES  Final diagnoses:  Weakness  Acute kidney injury (HCC)  Hyponatremia      NEW MEDICATIONS STARTED DURING THIS VISIT:  Current Discharge Medication List       Note:  This document was prepared using Dragon voice recognition software and may include unintentional dictation errors.     Merrily Brittle, MD  12/03/17 0129  

## 2017-11-30 NOTE — ED Notes (Signed)
Report called to gina rn floor nurse

## 2017-11-30 NOTE — H&P (Signed)
[facility] - [place] [facility]   PATIENT NAME: Katrina Cohen    MR#:  [id number]  DATE OF BIRTH:  Jun 12, 1965  DATE OF ADMISSION:  11/30/2017  PRIMARY CARE PHYSICIAN: Piedad ClimesFulbright, [place], [place]   REQUESTING/REFERRING PHYSICIAN: Dr. Lamont Snowballifenbark  CHIEF COMPLAINT:   Chief Complaint  Patient presents with  . Weakness    HISTORY OF PRESENT ILLNESS:  Katrina EeJennifer Heinzel  is a 52 y.o. female with a known history of chronic liver disease secondary to alcohol abuse, diabetes, bipolar disorder, anxiety/depression who presents to the hospital due to dizziness, altered mental status and noted to be severely hyponatremic.  Patient says she has chronic liver disease with volume overload and usually gets ultrasound-guided paracentesis twice weekly and her diuretics have been recently uptitrated with her Lasix dose doubled.  Over the past few days she has felt increasingly dizzy and lightheaded and therefore [facility] yesterday she was noted to be severely hyponatremic but not orthostatic, did give her some fluids and discharged her home.  She continues to be significantly symptomatic and came to the ER here and was noted to have a sodium of 117.  Hospitalist services were contacted for treatment evaluation.  Patient denies any abdominal pain, nausea, vomiting, fever, chills, chest pain, shortness of breath or any other associated symptoms presently.  PAST MEDICAL HISTORY:   Past Medical History:  Diagnosis Date  . Bipolar 2 disorder (HCC)   . Bipolar disorder (HCC) 1980s   with depression, anxiety  . Cirrhosis (HCC)   . Depression   . Depression with anxiety 1980s   [facility]    . Diabetes mellitus   . Dysphagia 11/2014   normal esophagram 11/2014. EGD per Dr Dulce Sellarutlaw.   . Hepatitis   . Hypertension   . Jaundice 04/2015   In setting of infectious mononucleosis. Ultrasound in [place]: hepatosplenomegaly, portal htn, possible cirrhosis  . Liver  disease   . Mononucleosis 04/15/2015    PAST SURGICAL HISTORY:   Past Surgical History:  Procedure Laterality Date  . APPENDECTOMY    . CESAREAN SECTION    . COLONOSCOPY    . ESOPHAGOGASTRODUODENOSCOPY     Dr Dulce Sellarutlaw ? 2016.   Gerald Leitz. IR GENERIC HISTORICAL  04/20/2016   IR PARACENTESIS 04/20/2016 Darrell K Allred, PA-C [facility]-INTERV RAD  . IR PARACENTESIS  03/23/2017  . IR PARACENTESIS  04/08/2017  . IR PARACENTESIS  07/18/2017  . IR PARACENTESIS  09/07/2017  . IR PARACENTESIS  09/14/2017  . IR PARACENTESIS  09/23/2017  . IR PARACENTESIS  10/06/2017  . IR PARACENTESIS  10/14/2017  . IR PARACENTESIS  10/21/2017  . IR PARACENTESIS  11/02/2017  . IR PARACENTESIS  11/08/2017  . IR PARACENTESIS  11/22/2017  . IR PARACENTESIS  11/25/2017  . IR PARACENTESIS  11/28/2017    SOCIAL HISTORY:   Social History   Tobacco Use  . Smoking status: Former Smoker    Packs/day: 0.50    Types: Cigarettes  . Smokeless tobacco: Never Used  Substance Use Topics  . Alcohol use: Not Currently    FAMILY HISTORY:   Family History  Problem Relation Age of Onset  . Diabetes Mother   . Cancer Father   . Diabetes Sister   . Diabetes Brother     DRUG ALLERGIES:   Allergies  Allergen Reactions  . Tizanidine Other (See Comments)    Dizzy,falls    REVIEW OF SYSTEMS:   Review of Systems  Constitutional: Negative  for fever and weight loss.  HENT: Negative for congestion, nosebleeds and tinnitus.   Eyes: Negative for blurred vision, double vision and redness.  Respiratory: Negative for cough, hemoptysis and shortness of breath.   Cardiovascular: Negative for chest pain, orthopnea, leg swelling and PND.  Gastrointestinal: Negative for abdominal pain, diarrhea, melena, nausea and vomiting.  Genitourinary: Negative for dysuria, hematuria and urgency.  Musculoskeletal: Negative for falls and joint pain.  Neurological: Positive for dizziness. Negative for tingling, sensory change, focal weakness, seizures, weakness  and headaches.  Endo/Heme/Allergies: Negative for polydipsia. Does not bruise/bleed easily.  Psychiatric/Behavioral: Negative for depression and memory loss. The patient is not nervous/anxious.     MEDICATIONS AT HOME:   Prior to Admission medications   Medication Sig Start Date End Date Taking? Authorizing Provider  albuterol (PROVENTIL) (2.5 MG/3ML) 0.083% nebulizer solution Take 3 mLs (2.5 mg total) by nebulization every 4 (four) hours as needed for wheezing or shortness of breath. 11/13/15  Yes Standley Brooking, MD  ARIPiprazole (ABILIFY) 5 MG tablet Take 5 mg by mouth at bedtime.  04/02/15  Yes [provider]  ciprofloxacin (CIPRO) 500 MG tablet Take 500 mg by mouth daily. 11/15/17  Yes [provider]  citalopram (CELEXA) 20 MG tablet TAKE 1 TABLET BY MOUTH EVERY NIGHT 12/20/16  Yes [provider]  lactulose (CHRONULAC) 10 GM/15ML solution Take 30 ml by mouth twice daily. 11/22/17  Yes [provider]  morphine (MS CONTIN) 30 MG 12 hr tablet Take 30 mg by mouth daily.   Yes [provider]  ondansetron (ZOFRAN ODT) 4 MG disintegrating tablet Take 1 tablet (4 mg total) by mouth every 8 (eight) hours as needed for nausea or vomiting. 01/07/17  Yes Dorena Bodo, NP  pioglitazone (ACTOS) 30 MG tablet Take 30 mg by mouth daily.   Yes [provider]  PROAIR HFA 108 (90 Base) MCG/ACT inhaler INHALE 2 PUFFS INTO THE LUNGS EVERY 6 HOURS AS NEEDED FOR SHORTNESS OF BREATH 11/09/17  Yes [provider]  spironolactone (ALDACTONE) 50 MG tablet Take 2 tablets (100 mg total) by mouth 2 (two) times daily. 10/28/17  Yes Auburn Bilberry, MD  traZODone (DESYREL) 50 MG tablet Take 50 mg by mouth at bedtime.   Yes [provider]  XIFAXAN 550 MG TABS tablet Take 1 tablet by mouth 2 (two) times daily. 06/10/17  Yes [provider]  furosemide (LASIX) 40 MG tablet Take 2 tablets (80 mg total) by mouth 3 (three) times daily. Patient  not taking: Reported on 11/30/2017 11/29/17   Mancel Bale, MD      VITAL SIGNS:  Blood pressure 91/66, pulse 74, temperature (!) 97.5 F (36.4 C), temperature source Oral, resp. rate 16, height 5\' 4"  (1.626 m), weight 82.6 kg (182 lb), last menstrual period 01/29/2013, SpO2 100 %.  PHYSICAL EXAMINATION:  Physical Exam  GENERAL:  52 y.o.-year-old patient lying in the bed in no acute distress.  EYES: Pupils equal, round, reactive to light and accommodation. + scleral icterus. Extraocular muscles intact.  HEENT: Head atraumatic, normocephalic. Oropharynx and nasopharynx clear. No oropharyngeal erythema, moist oral mucosa  NECK:  Supple, no jugular venous distention. No thyroid enlargement, no tenderness.  LUNGS: Normal breath sounds bilaterally, no wheezing, rales, rhonchi. No use of accessory muscles of respiration.  CARDIOVASCULAR: S1, S2 RRR. No murmurs, rubs, gallops, clicks.  ABDOMEN: Soft, Non-tender, distended, + fluid wave consistent with ascites. Bowel sounds present. No organomegaly or mass.  EXTREMITIES: +1-2 pedal edema, cyanosis, or  clubbing. + 2 pedal & radial pulses b/l.   NEUROLOGIC: Cranial nerves II through XII are intact. No focal Motor or sensory deficits appreciated b/l PSYCHIATRIC: The patient is alert and oriented x 3.  SKIN: No obvious rash, lesion, or ulcer.   LABORATORY PANEL:   CBC Recent Labs  Lab 11/30/17 1549  WBC 5.3  HGB 10.9*  HCT 31.5*  PLT 80*   ------------------------------------------------------------------------------------------------------------------  Chemistries  Recent Labs  Lab 11/30/17 1549  NA 117*  K 4.6  CL 82*  CO2 26  GLUCOSE 203*  BUN 44*  CREATININE 1.80*  CALCIUM 9.3  AST 68*  ALT 33  ALKPHOS 170*  BILITOT 4.4*   ------------------------------------------------------------------------------------------------------------------  Cardiac Enzymes No results for input(s): TROPONINI in the last 168  hours. ------------------------------------------------------------------------------------------------------------------  RADIOLOGY:  No results found.   IMPRESSION AND PLAN:   52 year old female with past medical history of chronic liver disease secondary to alcohol abuse, hepatic encephalopathy, hypertension, diabetes, anxiety/depression, bipolar disorder who presents to the hospital due to dizziness, and noted to be acutely hyponatremic.  1.  Hyponatremia- secondary to increasing use of diuretics from her chronic liver disease. -Patient's Lasix dose was recently doubled.  Will hold her diuretics including Aldactone and Lasix for now.  Gently hydrate her with IV fluids, follow sodium.  I will also get a nephrology consult. -I will check a urine, serum osmolality along with a urine sodium.  2.  Acute kidney injury-secondary to dehydration from increasing use of diuretics. -We will gently hydrate the patient with IV fluids, follow BUN and creatinine.  Hold diuretics for now.  3.  Chronic liver disease-secondary to alcohol abuse. - Patient's abdomen is distended consistent with ascites, I will order ultrasound-guided paracentesis for tomorrow.  She is already on empiric ciprofloxacin to prevent SBP.  Patient denies any abdominal pain or fever. - Continue lactulose, Xifaxan for underlying hepatic encephalopathy.  Ammonia level is normal. -We will consult gastroenterology.  4.  Anxiety/depression-continue Celexa, Abilify.  5.  Chronic pain-continue MS Contin.  6.  Thrombocytopenia-chronic secondary to chronic liver disease, no acute bleeding.  Follow platelet count.  All the records are reviewed and case discussed with ED provider. Management plans discussed with the patient, family and they are in agreement.  CODE STATUS: Full code  TOTAL TIME TAKING CARE OF THIS PATIENT: 45 minutes.    Houston Siren M.D on 11/30/2017 at 6:12 PM  Between 7am to 6pm - Pager -  [phone]  After 6pm go to [email or web address] - password [email or web address] [facility]  [facility]  Office  [phone]  CC: Primary care physician; [place], [place], [place]

## 2017-11-30 NOTE — ED Notes (Signed)
Date and time results received: 11/30/17 *4:29 PM** (use smartphrase ".now" to insert current time)  Test: NA  Critical Value: 117  Name of Provider Notified: Dr. Pershing ProudSchaevitz  Orders Received? Or Actions Taken?: Acknowledged

## 2017-12-01 ENCOUNTER — Inpatient Hospital Stay: Payer: Medicaid Other

## 2017-12-01 DIAGNOSIS — K7031 Alcoholic cirrhosis of liver with ascites: Secondary | ICD-10-CM

## 2017-12-01 DIAGNOSIS — K7290 Hepatic failure, unspecified without coma: Secondary | ICD-10-CM

## 2017-12-01 DIAGNOSIS — E871 Hypo-osmolality and hyponatremia: Principal | ICD-10-CM

## 2017-12-01 DIAGNOSIS — N179 Acute kidney failure, unspecified: Secondary | ICD-10-CM

## 2017-12-01 LAB — BASIC METABOLIC PANEL
Anion gap: 7 (ref 5–15)
BUN: 47 mg/dL — AB (ref 6–20)
CO2: 26 mmol/L (ref 22–32)
CREATININE: 1.89 mg/dL — AB (ref 0.44–1.00)
Calcium: 8.9 mg/dL (ref 8.9–10.3)
Chloride: 86 mmol/L — ABNORMAL LOW (ref 98–111)
GFR calc Af Amer: 34 mL/min — ABNORMAL LOW (ref >60)
GFR calc non Af Amer: 29 mL/min — ABNORMAL LOW (ref >60)
Glucose, Bld: 138 mg/dL — ABNORMAL HIGH (ref 70–99)
Potassium: 4.6 mmol/L (ref 3.5–5.1)
Sodium: 119 mmol/L — CL (ref 135–145)

## 2017-12-01 LAB — CBC
HEMATOCRIT: 29.6 % — AB (ref 35.0–47.0)
Hemoglobin: 10.3 g/dL — ABNORMAL LOW (ref 12.0–16.0)
MCH: 31.2 pg (ref 26.0–34.0)
MCHC: 34.9 g/dL (ref 32.0–36.0)
MCV: 89.4 fL (ref 80.0–100.0)
PLATELETS: 82 10*3/uL — AB (ref 150–440)
RBC: 3.31 MIL/uL — ABNORMAL LOW (ref 3.80–5.20)
RDW: 16.9 % — AB (ref 11.5–14.5)
WBC: 5.7 10*3/uL (ref 3.6–11.0)

## 2017-12-01 LAB — GLUCOSE, CAPILLARY
Glucose-Capillary: 139 mg/dL — ABNORMAL HIGH (ref 70–99)
Glucose-Capillary: 146 mg/dL — ABNORMAL HIGH (ref 70–99)
Glucose-Capillary: 119 mg/dL — ABNORMAL HIGH (ref 70–99)

## 2017-12-01 MED ORDER — INSULIN ASPART 100 UNIT/ML ~~LOC~~ SOLN: 0.0000 [IU] | Freq: Three times a day (TID) | SUBCUTANEOUS | Status: DC

## 2017-12-01 MED ORDER — INSULIN ASPART 100 UNIT/ML ~~LOC~~ SOLN
0.0000 [IU] | Freq: Four times a day (QID) | SUBCUTANEOUS | Status: DC
  Administered 2017-12-01 (×2): 1 [IU] via SUBCUTANEOUS
  Filled 2017-12-01 (×2): qty 1

## 2017-12-01 MED ORDER — MIDODRINE HCL 5 MG PO TABS
10.0000 mg | ORAL_TABLET | Freq: Two times a day (BID) | ORAL | Status: DC
  Administered 2017-12-01 – 2017-12-04 (×6): 10 mg via ORAL
  Filled 2017-12-01 (×7): qty 2

## 2017-12-01 MED ORDER — INSULIN ASPART 100 UNIT/ML ~~LOC~~ SOLN
0.0000 [IU] | Freq: Three times a day (TID) | SUBCUTANEOUS | Status: DC
  Filled 2017-12-01 (×2): qty 1

## 2017-12-01 MED ORDER — PIOGLITAZONE HCL 30 MG PO TABS
30.0000 mg | ORAL_TABLET | Freq: Every day | ORAL | Status: DC
  Administered 2017-12-01 – 2017-12-04 (×4): 30 mg via ORAL
  Filled 2017-12-01 (×4): qty 1

## 2017-12-01 MED ORDER — INSULIN ASPART 100 UNIT/ML ~~LOC~~ SOLN: 0.0000 [IU] | Freq: Every day | SUBCUTANEOUS | Status: DC

## 2017-12-01 MED ORDER — ALBUMIN HUMAN 25 % IV SOLN
25.0000 g | Freq: Three times a day (TID) | INTRAVENOUS | Status: DC
  Administered 2017-12-01 – 2017-12-04 (×10): 25 g via INTRAVENOUS
  Filled 2017-12-01 (×12): qty 100

## 2017-12-01 NOTE — Progress Notes (Signed)
[employer] -  at [facility]                                                                                                                                                                                  Patient Demographics   Katrina Cohen, is a 52 y.o. female, DOB - [birth year], NWG:[id number]  Admit date - 11/30/2017   Admitting Physician Houston Siren, MD  Outpatient Primary MD for the patient is [place], [place] E, PA-C   LOS - 1  Subjective:   Patient admitted with hyponatremia.  Sodium improved, renal function improved Weakness improved  Review of Systems:   CONSTITUTIONAL: No documented fever. No fatigue, weakness. No weight gain, no weight loss.  EYES: No blurry or double vision.  ENT: No tinnitus. No postnasal drip. No redness of the oropharynx.  RESPIRATORY: No cough, no wheeze, no hemoptysis. No dyspnea.  CARDIOVASCULAR: No chest pain. No orthopnea. No palpitations. No syncope.  GASTROINTESTINAL: No nausea, no vomiting or diarrhea. No abdominal pain. No melena or hematochezia.  GENITOURINARY: No dysuria or hematuria.  ENDOCRINE: No polyuria or nocturia. No heat or cold intolerance.  HEMATOLOGY: No anemia. No bruising. No bleeding.  INTEGUMENTARY: No rashes. No lesions.  MUSCULOSKELETAL: No arthritis. No swelling. No gout.  NEUROLOGIC: No numbness, tingling, or ataxia. No seizure-type activity.  PSYCHIATRIC: No anxiety. No insomnia. No ADD.    Vitals:   Vitals:   11/30/17 1908 11/30/17 2018 12/01/17 0448 12/01/17 1333  BP: (!) 98/41 (!) 106/47 (!) 92/50 (!) 101/40  Pulse: 63 81 72 68  Resp: 20 18 20 14   Temp: 97.8 F (36.6 C) (!) 97.5 F (36.4 C) 97.7 F (36.5 C) 97.6 F (36.4 C)  TempSrc:  Oral Oral Oral  SpO2: 100% 100% 100% 100%  Weight:      Height:        Wt Readings from Last 3 Encounters:  11/30/17 82.6 kg (182 lb)  11/29/17 82.6 kg (182 lb)  11/13/17 82.8 kg (182 lb 8 oz)     Intake/Output Summary (Last 24 hours)  at 12/01/2017 1409 Last data filed at 12/01/2017 1245 Gross per 24 hour  Intake 1647.5 ml  Output 100 ml  Net 1547.5 ml    Physical Exam:   GENERAL: Pleasant-appearing in no apparent distress.  HEAD, EYES, EARS, NOSE AND THROAT: Atraumatic, normocephalic. Extraocular muscles are intact. Pupils equal and reactive to light. Sclerae anicteric. No conjunctival injection. No oro-pharyngeal erythema.  NECK: Supple. There is no jugular venous distention. No bruits, no lymphadenopathy, no thyromegaly.  HEART: Regular rate and rhythm,. No murmurs, no rubs, no clicks.  LUNGS: Clear to auscultation bilaterally. No rales or rhonchi.  No wheezes.  ABDOMEN: Soft, flat, nontender, nondistended. Has good bowel sounds. No hepatosplenomegaly appreciated.  EXTREMITIES: No evidence of any cyanosis, clubbing, or peripheral edema.  +2 pedal and radial pulses bilaterally.  NEUROLOGIC: The patient is alert, awake, and oriented x3 with no focal motor or sensory deficits appreciated bilaterally.  SKIN: Moist and warm with no rashes appreciated.  Psych: Not anxious, depressed LN: No inguinal LN enlargement    Antibiotics   Anti-infectives (From admission, onward)   Start     Dose/Rate Route Frequency Ordered Stop   11/30/17 2200  rifaximin (XIFAXAN) tablet 550 mg     550 mg Oral 2 times daily 11/30/17 1923     11/30/17 1930  ciprofloxacin (CIPRO) tablet 500 mg     500 mg Oral Daily 11/30/17 1923        Medications   Scheduled Meds: . ARIPiprazole  5 mg Oral QHS  . ciprofloxacin  500 mg Oral Daily  . citalopram  20 mg Oral QHS  . insulin aspart  0-9 Units Subcutaneous Q6H  . lactulose  30 g Oral BID  . midodrine  10 mg Oral BID WC  . morphine  30 mg Oral Daily  . pioglitazone  30 mg Oral Daily  . rifaximin  550 mg Oral BID  . traZODone  50 mg Oral QHS   Continuous Infusions: . sodium chloride 75 mL/hr at 12/01/17 0849  . albumin human     PRN Meds:.acetaminophen **OR** acetaminophen, albuterol,  ondansetron **OR** ondansetron (ZOFRAN) IV, traMADol   Data Review:   Micro Results Recent Results (from the past 240 hour(s))  Culture, body fluid-bottle     Status: None   Collection Time: 11/22/17 10:03 AM  Result Value Ref Range Status   Specimen Description PERITONEAL  Final   Special Requests NONE  Final   Culture   Final    NO GROWTH 5 DAYS Performed at [facility], [address] [place], [place] [address]    Report Status 11/27/2017 FINAL  Final  Gram stain     Status: None   Collection Time: 11/22/17 10:03 AM  Result Value Ref Range Status   Specimen Description PERITONEAL  Final   Special Requests NONE  Final   Gram Stain   Final    RARE WBC PRESENT, PREDOMINANTLY MONONUCLEAR NO ORGANISMS SEEN Performed at [facility], [address] [place], [place] [address]    Report Status 11/22/2017 FINAL  Final  Body fluid culture     Status: None   Collection Time: 11/25/17 11:24 AM  Result Value Ref Range Status   Specimen Description FLUID PERITONEAL  Final   Special Requests NONE  Final   Gram Stain   Final    FEW WBC PRESENT,BOTH PMN AND MONONUCLEAR NO ORGANISMS SEEN    Culture   Final    NO GROWTH 3 DAYS Performed at [facility], [address], [place], [place] [address]    Report Status 11/28/2017 FINAL  Final    Radiology Reports US Abdomen Limited  Result Date: 11/09/2017 CLINICAL DATA:  Abdominal distension EXAM: LIMITED ABDOMEN ULTRASOUND FOR ASCITES TECHNIQUE: Limited ultrasound survey for ascites was performed in all four abdominal quadrants. COMPARISON:  11/08/2017 FINDINGS: Mild ascites is noted in all 4 quadrants although no sizable pocket to allow for safe paracentesis is noted at this time. Given the fact the patient had a 9 L drainage yesterday, paracentesis should be delayed for at least 1 week.  IMPRESSION: Mild ascites. Recommend paracentesis in 1 week or when patient is significantly clinically symptomatic. Electronically  Signed   By: Alcide Clever M.D.   On: 11/09/2017 15:28   US Paracentesis  Result Date: 11/14/2017 INDICATION: Cirrhosis, recurrent ascites, abdominal distension EXAM: ULTRASOUND GUIDED RIGHT PARACENTESIS MEDICATIONS: 1% LIDOCAINE LOCAL COMPLICATIONS: None immediate. PROCEDURE: Informed written consent was obtained from the patient after a discussion of the risks, benefits and alternatives to treatment. A timeout was performed prior to the initiation of the procedure. Initial ultrasound scanning demonstrates a large amount of ascites within the right lower abdominal quadrant. The right lower abdomen was prepped and draped in the usual sterile fashion. 1% lidocaine with epinephrine was used for local anesthesia. Following this, a 6 Fr Safe-T-Centesis catheter was introduced. An ultrasound image was saved for documentation purposes. The paracentesis was performed. The catheter was removed and a dressing was applied. The patient tolerated the procedure well without immediate post procedural complication. FINDINGS: A total of approximately 5.1 L of clear peritoneal fluid was removed. Sample was not sent for laboratory analysis IMPRESSION: Successful ultrasound-guided paracentesis yielding 5.1 L liters of peritoneal fluid. Electronically Signed   By: Judie Petit.  Shick M.D.   On: 11/14/2017 11:06   Ir Paracentesis  Result Date: 11/28/2017 INDICATION: Patient with recurrent ascites. Request is made for diagnostic and therapeutic paracentesis. Patient to receive albumin postprocedure. EXAM: ULTRASOUND GUIDED DIAGNOSTIC AND THERAPEUTIC PARACENTESIS MEDICATIONS: 10 mL 2% lidocaine COMPLICATIONS: None immediate. PROCEDURE: Informed written consent was obtained from the patient after a discussion of the risks, benefits and alternatives to treatment. A timeout was performed prior to the initiation of the procedure. Initial ultrasound scanning demonstrates a large amount of ascites within the right lower abdominal quadrant. The  right lower abdomen was prepped and draped in the usual sterile fashion. 2% lidocaine was used for local anesthesia. Following this, a 19 gauge, 7-cm, Yueh catheter was introduced. An ultrasound image was saved for documentation purposes. The paracentesis was performed. The catheter was removed and a dressing was applied. The patient tolerated the procedure well without immediate post procedural complication. FINDINGS: A total of approximately 5.8 liters of yellow fluid was removed. Samples were sent to the laboratory as requested by the clinical team. IMPRESSION: Successful ultrasound-guided diagnostic and therapeutic paracentesis yielding 5.8 liters of peritoneal fluid. Read by: Loyce Dys PA-C Electronically Signed   By: Simonne Come M.D.   On: 11/28/2017 14:21   Ir Paracentesis  Result Date: 11/25/2017 INDICATION: Patient with recurrent ascites. Request is made for diagnostic and therapeutic paracentesis. Patient to receive albumin if greater than 6 L removed. EXAM: ULTRASOUND GUIDED DIAGNOSTIC AND THERAPEUTIC PARACENTESIS MEDICATIONS: 10 mL 2% lidocaine COMPLICATIONS: None immediate. PROCEDURE: Informed written consent was obtained from the patient after a discussion of the risks, benefits and alternatives to treatment. A timeout was performed prior to the initiation of the procedure. Initial ultrasound scanning demonstrates a large amount of ascites within the left lateral abdomen. The left lateral abdomen was prepped and draped in the usual sterile fashion. 2% lidocaine was used for local anesthesia. Following this, a 19 gauge, 7-cm, Yueh catheter was introduced. An ultrasound image was saved for documentation purposes. The paracentesis was performed. The catheter was removed and a dressing was applied. The patient tolerated the procedure well without immediate post procedural complication. FINDINGS: A total of approximately 5.5 liters of yellow fluid was removed. Samples were sent to the laboratory as  requested by the clinical team. IMPRESSION: Successful ultrasound-guided diagnostic and therapeutic  paracentesis yielding 5.5 liters of peritoneal fluid. Read by: Loyce Dys PA-C Electronically Signed   By: Jolaine Click M.D.   On: 11/25/2017 13:20   Ir Paracentesis  Result Date: 11/22/2017 INDICATION: Patient with recurrent ascites. Request is made for diagnostic and therapeutic paracentesis. EXAM: ULTRASOUND GUIDED DIAGNOSTIC AND THERAPEUTIC PARACENTESIS MEDICATIONS: 10 mL 2% lidocaine COMPLICATIONS: None immediate. PROCEDURE: Informed written consent was obtained from the patient after a discussion of the risks, benefits and alternatives to treatment. A timeout was performed prior to the initiation of the procedure. Initial ultrasound scanning demonstrates a large amount of ascites within the left lateral abdomen. The left lateral abdomen was prepped and draped in the usual sterile fashion. 2% lidocaine was used for local anesthesia. Following this, a 19 gauge, 10-cm, Yueh catheter was introduced. An ultrasound image was saved for documentation purposes. The paracentesis was performed. The catheter was removed and a dressing was applied. The patient tolerated the procedure well without immediate post procedural complication. FINDINGS: A total of approximately 7.4 liters of clear, yellow fluid was removed. Samples were sent to the laboratory as requested by the clinical team. IMPRESSION: Successful ultrasound-guided diagnostic and therapeutic paracentesis yielding 7.4 liters of peritoneal fluid. Read by: Loyce Dys PA-C Electronically Signed   By: Corlis Leak M.D.   On: 11/22/2017 14:07   Ir Paracentesis  Result Date: 11/08/2017 INDICATION: Alcoholic cirrhosis with recurrent ascites. Request for diagnostic and therapeutic paracentesis. EXAM: ULTRASOUND GUIDED PARACENTESIS MEDICATIONS: 2% lidocaine = 10 mL COMPLICATIONS: None immediate. PROCEDURE: Informed written consent was obtained from the patient  after a discussion of the risks, benefits and alternatives to treatment. A timeout was performed prior to the initiation of the procedure. Initial ultrasound scanning demonstrates a large amount of ascites within the left lower abdominal quadrant. The left lower abdomen was prepped and draped in the usual sterile fashion. 1% lidocaine with epinephrine was used for local anesthesia. Following this, a 19 gauge, 7-cm, Yueh catheter was introduced. An ultrasound image was saved for documentation purposes. The paracentesis was performed. The catheter was removed and a dressing was applied. The patient tolerated the procedure well without immediate post procedural complication. FINDINGS: A total of approximately 9 liters of cloudy yellow fluid was removed. Samples were sent to the laboratory as requested by the clinical team. IMPRESSION: Successful ultrasound-guided paracentesis yielding 9 liters of peritoneal fluid. Read by: Corrin Parker, PA-C Electronically Signed   By: Judie Petit.  Shick M.D.   On: 11/08/2017 10:42   Ir Paracentesis  Result Date: 11/02/2017 INDICATION: Patient with history of alcoholic cirrhosis with recurrent ascites. Request is made for diagnostic and therapeutic paracentesis. EXAM: ULTRASOUND GUIDED DIAGNOSTIC AND THERAPEUTIC PARACENTESIS MEDICATIONS: 10 milliliters of 2% lidocaine COMPLICATIONS: None immediate. PROCEDURE: Informed written consent was obtained from the patient after a discussion of the risks, benefits and alternatives to treatment. A timeout was performed prior to the initiation of the procedure. Initial ultrasound scanning demonstrates a large amount of ascites within the left lower abdominal quadrant. The left lower abdomen was prepped and draped in the usual sterile fashion. 2% lidocaine was used for local anesthesia. Following this, a 19 gauge, 7-cm, Yueh catheter was introduced. An ultrasound image was saved for documentation purposes. The paracentesis was performed. The catheter was  removed and a dressing was applied. The patient tolerated the procedure well without immediate post procedural complication. FINDINGS: A total of approximately 7.3 liters of clear gold fluid was removed. Samples were sent to the laboratory as requested by the clinical team.  IMPRESSION: Successful ultrasound-guided paracentesis yielding 7.3 liters of peritoneal fluid. Read by: Elwin Mocha, PA-C Electronically Signed   By: Gilmer Mor D.O.   On: 11/02/2017 14:20     CBC Recent Labs  Lab 11/29/17 2043 11/30/17 1549 12/01/17 0449  WBC 5.0 5.3 5.7  HGB 9.7* 10.9* 10.3*  HCT 27.6* 31.5* 29.6*  PLT 69* 80* 82*  MCV 86.3 89.8 89.4  MCH 30.3 31.2 31.2  MCHC 35.1 34.8 34.9  RDW 16.5* 17.0* 16.9*  LYMPHSABS 0.6*  --   --   MONOABS 0.7  --   --   EOSABS 0.1  --   --   BASOSABS 0.0  --   --     Chemistries  Recent Labs  Lab 11/29/17 2043 11/30/17 1549 12/01/17 0449  NA 119* 117* 119*  K 5.0 4.6 4.6  CL 83* 82* 86*  CO2 26 26 26   GLUCOSE 172* 203* 138*  BUN 43* 44* 47*  CREATININE 1.62* 1.80* 1.89*  CALCIUM 9.0 9.3 8.9  AST 56* 68*  --   ALT 31 33  --   ALKPHOS 169* 170*  --   BILITOT 3.8* 4.4*  --    ------------------------------------------------------------------------------------------------------------------ estimated creatinine clearance is 36.2 mL/min (A) (by C-G formula based on SCr of 1.89 mg/dL (H)). ------------------------------------------------------------------------------------------------------------------ No results for input(s): HGBA1C in the last 72 hours. ------------------------------------------------------------------------------------------------------------------ No results for input(s): CHOL, HDL, LDLCALC, TRIG, CHOLHDL, LDLDIRECT in the last 72 hours. ------------------------------------------------------------------------------------------------------------------ No results for input(s): TSH, T4TOTAL, T3FREE, THYROIDAB in the last 72  hours.  Invalid input(s): FREET3 ------------------------------------------------------------------------------------------------------------------ No results for input(s): VITAMINB12, FOLATE, FERRITIN, TIBC, IRON, RETICCTPCT in the last 72 hours.  Coagulation profile Recent Labs  Lab 11/30/17 1732  INR 1.51    No results for input(s): DDIMER in the last 72 hours.  Cardiac Enzymes No results for input(s): CKMB, TROPONINI, MYOGLOBIN in the last 168 hours.  Invalid input(s): CK ------------------------------------------------------------------------------------------------------------------ Invalid input(s): POCBNP    Assessment & Plan   52 year old female with past medical history of chronic liver disease secondary to alcohol abuse, hepatic encephalopathy, hypertension, diabetes, anxiety/depression, bipolar disorder who presents to the hospital due to dizziness, and noted to be acutely hyponatremic.  1.  Hyponatremia- some improvement but not significant improved Patient will be seen by nephrology plan for her to receive some albumin.   2.  Acute kidney injury-secondary to dehydration from increasing use of diuretics. Continue IV fluids follow renal function  3.  Chronic liver disease-secondary to alcohol abuse. - Patient's abdomen is distended consistent with ascites -Hold off on paracentesis for now due to patient's acute kidney injury and hyponatremia  4.  Anxiety/depression-continue Celexa, Abilify.  5.  Chronic pain-continue MS Contin.  6.  Thrombocytopenia-chronic secondary to chronic liver disease, no acute bleeding.  Follow platelet count.       Code Status Orders  (From admission, onward)        Start     Ordered   11/30/17 1924  Full code  Continuous     11/30/17 1923    Code Status History    Date Active Date Inactive Code Status Order ID Comments User Context   11/13/2017 0225 11/14/2017 1608 Full Code [id number]  Barbaraann Rondo, MD  Inpatient   10/27/2017 0820 10/28/2017 1555 Full Code [id number]  Barbaraann Rondo, MD ED   01/18/2017 2156 01/21/2017 1735 Full Code [id number]  Eduard Clos, MD ED   04/02/2016 0506 04/03/2016 1524 Full Code [id number]  Opyd, Lavone Neri, MD ED  11/12/2015 1753 11/13/2015 1502 Full Code [id number]  Standley BrookingGoodrich, Daniel P, MD ED   11/07/2015 [id number] 11/08/2015 1423 Full Code [id number]  Michael Litterarter, Nikki, MD Inpatient   04/28/2015 2111 04/30/2015 2323 Full Code [id number]  Eston EstersHamad, Ahmad, MD ED   06/07/2011 1321 06/08/2011 2157 Full Code [id number]  Dione BoozeGlick, David, MD ED   05/27/2011 0912 05/27/2011 1811 Full Code [id number]  Remi Haggardrawford, Anne, NP ED           Consults nephrology and GI  DVT Prophylaxis SCDs  Lab Results  Component Value Date   PLT 82 (L) 12/01/2017     Time Spent in minutes  35min Greater than 50% of time spent in care coordination and counseling patient regarding the condition and plan of care.   Auburn BilberryShreyang Nezzie Manera M.D on 12/01/2017 at 2:09 PM  Between 7am to 6pm - Pager - [phone]  After 6pm go to [email or web address] - Social research officer, governmentpassword [email or web address]  [employer]   Office  [phone]

## 2017-12-01 NOTE — Progress Notes (Signed)
Patient was very hesitant about taking insulin.  She said she just doesn't want any part of insulin.  Dr Allena KatzPatel notified and ordered actos as patient takes at home

## 2017-12-01 NOTE — Progress Notes (Signed)
Inpatient Diabetes Program Recommendations  AACE/ADA: New Consensus Statement on Inpatient Glycemic Control (2019)  Target Ranges:  Prepandial:   less than 140 mg/dL      Peak postprandial:   less than 180 mg/dL (1-2 hours)      Critically ill patients:  140 - 180 mg/dL   Results for Katrina Cohen, Shaaron LOUISE (MRN [id number]) as of 12/01/2017 11:18  Ref. Range 11/29/2017 20:43 11/30/2017 15:49 12/01/2017 04:49  Glucose Latest Ref Range: 70 - 99 mg/dL 409172 (H) 811203 (H) 914138 (H)    Review of Glycemic Control  Diabetes history: DM2 Outpatient Diabetes medications: Actos 30 mg daily Current orders for Inpatient glycemic control: None  Inpatient Diabetes Program Recommendations: Correction (SSI): While inpatient, please consider ordering CBGs with Novolog 0-9 units TID with meals and Novolog 0-5 units QHS. HgbA1C: Please consider ordering an A1C to evaluate glycemic control over the past 2-3 months. Outpatient DM medication: Noted patient has Actos as outpatient DM medication. Actos has side effect of fluid retention. Due to chronic liver disease with noted ascites, MD may want to consider discontinuing Actos and using a different DM medication at time of discharge.  Thanks, Orlando PennerMarie Kealii Thueson, RN, MSN, CDE Diabetes Coordinator Inpatient Diabetes Program [phone] (Team Pager from 8am to 5pm)

## 2017-12-01 NOTE — Consult Note (Signed)
[facility] CONSULT NOTE    Date: 12/01/2017                  Patient Name:  Katrina Cohen  MRN: [id number]  DOB: 1966/05/24  Age / Sex: 52 y.o., female         PCP: Piedad Climes, [place], PA-C                 Service Requesting Consult: Hospitalist                 Reason for Consult:  Hyponatremia/acute renal failure            History of Present Illness: Patient is a 52 y.o. female with a PMHx of bipolar disorder, cirrhosis of the liver, depression, diabetes mellitus, hepatitis, hypertension, mononucleosis, who was admitted to [facility] on 11/30/2017 for evaluation of weakness.  Upon presentation she was found to have a very low sodium of 117.  She had her Lasix dose recently titrated to 80 mg twice daily and she also continued on Spironolactone 100 mg twice daily.  She also had paracentesis performed on November 28, 2017.  Patient has been started on IV fluid hydration and serum sodium up to 119 at the moment.  Her abdomen appears quite distended.  She normally undergoes paracentesis twice per week.  She also appears to have acute renal failure.  BUN currently up to 47 with a creatinine of 1.89.   Medications: Outpatient medications: Medications Prior to Admission  Medication Sig Dispense Refill Last Dose  . albuterol (PROVENTIL) (2.5 MG/3ML) 0.083% nebulizer solution Take 3 mLs (2.5 mg total) by nebulization every 4 (four) hours as needed for wheezing or shortness of breath. 75 mL 1 PRN at PRN  . ARIPiprazole (ABILIFY) 5 MG tablet Take 5 mg by mouth at bedtime.   0 11/29/2017 at 2000  . ciprofloxacin (CIPRO) 500 MG tablet Take 500 mg by mouth daily.  6 11/29/2017 at 0800  . citalopram (CELEXA) 20 MG tablet TAKE 1 TABLET BY MOUTH EVERY NIGHT   11/29/2017 at 2000  . lactulose (CHRONULAC) 10 GM/15ML solution Take 30 ml by mouth twice daily.  5 11/29/2017 at 2000  . morphine (MS CONTIN) 30 MG 12 hr tablet Take 30 mg by mouth daily.   11/30/2017 at 1200  . ondansetron (ZOFRAN  ODT) 4 MG disintegrating tablet Take 1 tablet (4 mg total) by mouth every 8 (eight) hours as needed for nausea or vomiting. 20 tablet 0 PRN at PRN  . pioglitazone (ACTOS) 30 MG tablet Take 30 mg by mouth daily.   11/29/2017 at 0800  . PROAIR HFA 108 (90 Base) MCG/ACT inhaler INHALE 2 PUFFS INTO THE LUNGS EVERY 6 HOURS AS NEEDED FOR SHORTNESS OF BREATH  4 PRN at PRN  . spironolactone (ALDACTONE) 50 MG tablet Take 2 tablets (100 mg total) by mouth 2 (two) times daily. 60 tablet 2 11/29/2017 at 2000  . traZODone (DESYREL) 50 MG tablet Take 50 mg by mouth at bedtime.   11/29/2017 at 2000  . XIFAXAN 550 MG TABS tablet Take 1 tablet by mouth 2 (two) times daily.  5 11/29/2017 at 2000  . furosemide (LASIX) 40 MG tablet Take 2 tablets (80 mg total) by mouth 3 (three) times daily. (Patient not taking: Reported on 11/30/2017) 90 tablet 0 Not Taking at Unknown time    Current medications: Current Facility-Administered Medications  Medication Dose Route Frequency Provider Last Rate Last Dose  . 0.9 %  sodium chloride infusion   Intravenous Continuous Houston Siren, MD 75 mL/hr at 12/01/17 0849    . acetaminophen (TYLENOL) tablet 650 mg  650 mg Oral Q6H PRN Houston Siren, MD       Or  . acetaminophen (TYLENOL) suppository 650 mg  650 mg Rectal Q6H PRN Houston Siren, MD      . albumin human 25 % solution 25 g  25 g Intravenous Q8H Averyana Pillars, MD      . albuterol (PROVENTIL) (2.5 MG/3ML) 0.083% nebulizer solution 2.5 mg  2.5 mg Nebulization Q4H PRN Houston Siren, MD      . ARIPiprazole (ABILIFY) tablet 5 mg  5 mg Oral QHS Houston Siren, MD   5 mg at 11/30/17 2207  . ciprofloxacin (CIPRO) tablet 500 mg  500 mg Oral Daily Houston Siren, MD   500 mg at 12/01/17 1042  . citalopram (CELEXA) tablet 20 mg  20 mg Oral QHS Houston Siren, MD   20 mg at 11/30/17 2207  . insulin aspart (novoLOG) injection 0-9 Units  0-9 Units Subcutaneous Q6H Auburn Bilberry, MD   1 Units at 12/01/17 1242  . lactulose  (CHRONULAC) 10 GM/15ML solution 30 g  30 g Oral BID Houston Siren, MD   30 g at 12/01/17 1043  . midodrine (PROAMATINE) tablet 10 mg  10 mg Oral BID WC Jaeli Grubb, MD      . morphine (MS CONTIN) 12 hr tablet 30 mg  30 mg Oral Daily Houston Siren, MD   30 mg at 12/01/17 1042  . ondansetron (ZOFRAN) tablet 4 mg  4 mg Oral Q6H PRN Houston Siren, MD       Or  . ondansetron (ZOFRAN) injection 4 mg  4 mg Intravenous Q6H PRN Houston Siren, MD      . pioglitazone (ACTOS) tablet 30 mg  30 mg Oral Daily Auburn Bilberry, MD   30 mg at 12/01/17 1251  . rifaximin (XIFAXAN) tablet 550 mg  550 mg Oral BID Houston Siren, MD   550 mg at 12/01/17 1042  . traMADol (ULTRAM) tablet 50 mg  50 mg Oral Q12H PRN Oralia Manis, MD   50 mg at 11/30/17 2207  . traZODone (DESYREL) tablet 50 mg  50 mg Oral QHS Houston Siren, MD   50 mg at 11/30/17 2207      Allergies: Allergies  Allergen Reactions  . Tizanidine Other (See Comments)    Dizzy,falls      Past Medical History: Past Medical History:  Diagnosis Date  . Bipolar 2 disorder (HCC)   . Bipolar disorder (HCC) 1980s   with depression, anxiety  . Cirrhosis (HCC)   . Depression   . Depression with anxiety 1980s   [facility] admission 05/2011.    . Diabetes mellitus   . Dysphagia 11/2014   normal esophagram 11/2014. EGD per Dr Dulce Sellar.   . Hepatitis   . Hypertension   . Jaundice 04/2015   In setting of infectious mononucleosis. Ultrasound in [place]: hepatosplenomegaly, portal htn, possible cirrhosis  . Liver disease   . Mononucleosis 04/15/2015     Past Surgical History: Past Surgical History:  Procedure Laterality Date  . APPENDECTOMY    . CESAREAN SECTION    . COLONOSCOPY    . ESOPHAGOGASTRODUODENOSCOPY     Dr Dulce Sellar ? 2016.   Gerald Leitz GENERIC HISTORICAL  04/20/2016   IR PARACENTESIS 04/20/2016 Darrell K Allred, PA-C [facility]  .  IR PARACENTESIS  03/23/2017  . IR PARACENTESIS  04/08/2017  . IR PARACENTESIS  07/18/2017  .  IR PARACENTESIS  09/07/2017  . IR PARACENTESIS  09/14/2017  . IR PARACENTESIS  09/23/2017  . IR PARACENTESIS  10/06/2017  . IR PARACENTESIS  10/14/2017  . IR PARACENTESIS  10/21/2017  . IR PARACENTESIS  11/02/2017  . IR PARACENTESIS  11/08/2017  . IR PARACENTESIS  11/22/2017  . IR PARACENTESIS  11/25/2017  . IR PARACENTESIS  11/28/2017     Family History: Family History  Problem Relation Age of Onset  . Diabetes Mother   . Cancer Father   . Diabetes Sister   . Diabetes Brother      Social History: Social History   Socioeconomic History  . Marital status: Legally Separated    Spouse name: Not on file  . Number of children: Not on file  . Years of education: Not on file  . Highest education level: Not on file  Occupational History  . Not on file  Social Needs  . Financial resource strain: Not on file  . Food insecurity:    Worry: Not on file    Inability: Not on file  . Transportation needs:    Medical: Not on file    Non-medical: Not on file  Tobacco Use  . Smoking status: Former Smoker    Packs/day: 0.50    Types: Cigarettes  . Smokeless tobacco: Never Used  Substance and Sexual Activity  . Alcohol use: Not Currently  . Drug use: No  . Sexual activity: Not on file    Comment: Married   Lifestyle  . Physical activity:    Days per week: Not on file    Minutes per session: Not on file  . Stress: Not on file  Relationships  . Social connections:    Talks on phone: Not on file    Gets together: Not on file    Attends religious service: Not on file    Active member of club or organization: Not on file    Attends meetings of clubs or organizations: Not on file    Relationship status: Not on file  . Intimate partner violence:    Fear of current or ex partner: Not on file    Emotionally abused: Not on file    Physically abused: Not on file    Forced sexual activity: Not on file  Other Topics Concern  . Not on file  Social History Narrative  . Not on file      Review of Systems: Review of Systems  Constitutional: Positive for malaise/fatigue. Negative for chills and fever.  HENT: Negative for hearing loss and tinnitus.   Eyes: Negative for blurred vision and double vision.  Respiratory: Negative for cough, hemoptysis and sputum production.   Cardiovascular: Negative for chest pain, palpitations and orthopnea.  Gastrointestinal: Positive for abdominal pain. Negative for heartburn, nausea and vomiting.  Genitourinary: Negative for dysuria, frequency and urgency.  Musculoskeletal: Negative for joint pain and myalgias.  Skin: Negative for itching and rash.  Neurological: Positive for weakness.  Endo/Heme/Allergies: Negative for polydipsia. Does not bruise/bleed easily.  Psychiatric/Behavioral: Negative for hallucinations. The patient is not nervous/anxious.      Vital Signs: Blood pressure (!) 101/40, pulse 68, temperature 97.6 F (36.4 C), temperature source Oral, resp. rate 14, height 5\' 4"  (1.626 m), weight 82.6 kg (182 lb), last menstrual period 01/29/2013, SpO2 100 %.  Weight trends: Filed Weights   11/30/17 1545  Weight: 82.6  kg (182 lb)    Physical Exam: General: NAD, resting in bed  Head: Normocephalic, atraumatic.  Eyes: Anicteric, EOMI  Nose: Mucous membranes moist, not inflammed, nonerythematous.  Throat: Oropharynx nonerythematous, no exudate appreciated.   Neck: Supple, trachea midline.  Lungs:  Normal respiratory effort. Clear to auscultation BL without crackles or wheezes.  Heart: RRR. S1 and S2 normal without gallop, murmur, or rubs.  Abdomen:  Distended, BS present, no rebound  Extremities: 2+ pretibial edema.  Neurologic: A&O X3, Motor strength is 5/5 in the all 4 extremities  Skin: No visible rashes, scars.    Lab results: Basic Metabolic Panel: Recent Labs  Lab 11/29/17 2043 11/30/17 1549 12/01/17 0449  NA 119* 117* 119*  K 5.0 4.6 4.6  CL 83* 82* 86*  CO2 26 26 26   GLUCOSE 172* 203* 138*  BUN  43* 44* 47*  CREATININE 1.62* 1.80* 1.89*  CALCIUM 9.0 9.3 8.9    Liver Function Tests: Recent Labs  Lab 11/29/17 2043 11/30/17 1549  AST 56* 68*  ALT 31 33  ALKPHOS 169* 170*  BILITOT 3.8* 4.4*  PROT 5.6* 6.4*  ALBUMIN 2.7* 3.1*   No results for input(s): LIPASE, AMYLASE in the last 168 hours. Recent Labs  Lab 11/29/17 2043 11/30/17 1703  AMMONIA 16 <9*    CBC: Recent Labs  Lab 11/29/17 2043 11/30/17 1549 12/01/17 0449  WBC 5.0 5.3 5.7  NEUTROABS 3.7  --   --   HGB 9.7* 10.9* 10.3*  HCT 27.6* 31.5* 29.6*  MCV 86.3 89.8 89.4  PLT 69* 80* 82*    Cardiac Enzymes: No results for input(s): CKTOTAL, CKMB, CKMBINDEX, TROPONINI in the last 168 hours.  BNP: Invalid input(s): POCBNP  CBG: Recent Labs  Lab 12/01/17 1229  GLUCAP 139*    Microbiology: Results for orders placed or performed during the hospital encounter of 11/25/17  Body fluid culture     Status: None   Collection Time: 11/25/17 11:24 AM  Result Value Ref Range Status   Specimen Description FLUID PERITONEAL  Final   Special Requests NONE  Final   Gram Stain   Final    FEW WBC PRESENT,BOTH PMN AND MONONUCLEAR NO ORGANISMS SEEN    Culture   Final    NO GROWTH 3 DAYS Performed at [facility], [address] [place], [place] [address]    Report Status 11/28/2017 FINAL  Final    Coagulation Studies: Recent Labs    11/30/17 1732  LABPROT 18.1*  INR 1.51    Urinalysis: Recent Labs    11/30/17 1547  COLORURINE YELLOW*  LABSPEC 1.009  PHURINE 5.0  GLUCOSEU NEGATIVE  HGBUR NEGATIVE  BILIRUBINUR NEGATIVE  KETONESUR NEGATIVE  PROTEINUR NEGATIVE  NITRITE NEGATIVE  LEUKOCYTESUR NEGATIVE      Imaging:  No results found.   Assessment & Plan: Pt is a 52 y.o. female with a PMHx of bipolar disorder, cirrhosis of the liver, depression, diabetes mellitus, hepatitis, hypertension, mononucleosis, who was admitted to [facility] on 11/30/2017 for evaluation of weakness.  Upon  presentation she was found to have a very low sodium of 117.  1.  Acute renal failure. 2.  Severe hyponatremia.  3.  Cirrhosis of the liver.  4.  Lower extremity edema.  5.  Recurrent ascites.   Plan:  The patient was admitted with severe hyponatremia with a serum sodium of 117.  Suspect that this may be related to recent increase in diuretics as Lasix was increased to 80 mg p.o.  twice daily.  She is also on spironolactone 200 mg total daily.  In addition she has frequent large volume paracentesis.  She also has concomitant acute renal failure which is also likely related to recent increase in diuretics.  We will proceed with renal ultrasound, urine sodium, urine creatinine, uric acid level.  Continue IV fluid hydration with 0.9 normal saline for now.  If serum sodium plateaus we may need to consider using 3% saline.  She has significant ascites.  Consider paracentesis perhaps tomorrow.  Avoid large volume paracentesis today as we are trying to replete her intravascular volume.  We will also add albumin 25 g IV every 8 hours.  Further plan as patient progresses.  Thanks for consultation.

## 2017-12-01 NOTE — Consult Note (Signed)
Katrina Darby, MD [address]  [place]  [place], [place] [address]  Main: [phone]  Fax: [phone] Pager: [phone]   Consultation  Referring Provider:     No ref. provider found Primary Care Physician:  Elisabeth Cara, [place] Primary Gastroenterologist:  Dr. Alice Reichert       Reason for Consultation:     Hyponatremia, altered mental status, acute renal failure  Date of Admission:  11/30/2017 Date of Consultation:  12/01/2017         HPI:   Katrina Cohen is a 52 y.o. 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 She was found to have severe hyponatremia, serum sodium of 119. She reports that she went to [place] on 11/29/2017 with the above symptoms and was found to have severe hyponatremia, discharge from the emergency room as she was scheduled for paracentesis tomorrow as outpatient and was told to stop diuretics. She presented to [facility] ER yesterday due to worsening weakness and confusion. Her serum sodium was 117. Patient denies fever, chills. She does have abdominal distention secondary to ascites and is needing paracentesis about twice weekly. Her primary gastroenterologist has been adjusting diuretics, she was on Lasix 80 mg twice daily and spironolactone 100 mg twice daily as outpatient prior to this admission.  She is also found to have elevated BUN/creatinine and creatinine compared to baseline. She is consulted by nephrology and gastroenterology for further management Patient is lethargic but arousable and able to answer questions when he saw her. She is receiving albumin. She tells me that she is getting paracentesis tomorrow. She denies abdominal pain, fever, chills, nausea, vomiting, hematemesis, melena, rectal bleeding. She also had a nonabsorbable suture placed into the abdominal wound from which she was  leaking ascites fluid from paracentesis obtained earlier this month She is started on normal saline and diuretics were held. She also reports being evaluated by the liver transplant team at [facility]. 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 Bipolar 2 disorder ([place])   . Bipolar disorder ([place]) 1980s   with depression, anxiety  . Cirrhosis ([place])   . Depression   . Depression with anxiety 1980s   [facility] admission 05/2011.    . Diabetes mellitus   . Dysphagia 11/2014   normal esophagram 11/2014. EGD per Dr Paulita Fujita.   . Hepatitis   . Hypertension   . Jaundice 04/2015   In setting of infectious mononucleosis. Ultrasound in [place]: hepatosplenomegaly, portal htn, possible cirrhosis  . Liver disease   . Mononucleosis 04/15/2015    Past Surgical History:  Procedure Laterality Date  . APPENDECTOMY    . CESAREAN SECTION    . COLONOSCOPY    . ESOPHAGOGASTRODUODENOSCOPY     Dr Paulita Fujita ? 2016.   Everlean Alstrom GENERIC HISTORICAL  04/20/2016   IR PARACENTESIS 04/20/2016 Darrell K Allred, PA-C [facility]-INTERV RAD  . IR PARACENTESIS  03/23/2017  . IR PARACENTESIS  04/08/2017  . IR PARACENTESIS  07/18/2017  . IR PARACENTESIS  09/07/2017  . IR PARACENTESIS  09/14/2017  . IR PARACENTESIS  09/23/2017  . IR PARACENTESIS  10/06/2017  . IR PARACENTESIS  10/14/2017  . IR PARACENTESIS  10/21/2017  . IR PARACENTESIS  11/02/2017  .  IR PARACENTESIS  11/08/2017  . IR PARACENTESIS  11/22/2017  . IR PARACENTESIS  11/25/2017  . IR PARACENTESIS  11/28/2017    Prior to Admission medications   Medication Sig Start Date End Date Taking? Authorizing Provider  albuterol (PROVENTIL) (2.5 MG/3ML) 0.083% nebulizer solution Take 3 mLs (2.5 mg total) by nebulization every 4 (four) hours as needed for wheezing or shortness of breath. 11/13/15  Yes Samuella Cota, MD  ARIPiprazole (ABILIFY) 5 MG tablet Take 5 mg by mouth at bedtime.  04/02/15  Yes [provider]  ciprofloxacin (CIPRO) 500 MG tablet Take 500 mg by mouth daily. 11/15/17  Yes [provider]  citalopram (CELEXA) 20 MG tablet TAKE 1 TABLET BY MOUTH EVERY NIGHT 12/20/16  Yes [provider]  lactulose (CHRONULAC) 10 GM/15ML solution Take 30 ml by mouth twice daily. 11/22/17  Yes [provider]  morphine (MS CONTIN) 30 MG 12 hr tablet Take 30 mg by mouth daily.   Yes [provider]  ondansetron (ZOFRAN ODT) 4 MG disintegrating tablet Take 1 tablet (4 mg total) by mouth every 8 (eight) hours as needed for nausea or vomiting. 01/07/17  Yes Barnet Glasgow, NP  pioglitazone (ACTOS) 30 MG tablet Take 30 mg by mouth daily.   Yes [provider]  PROAIR HFA 108 (90 Base) MCG/ACT inhaler INHALE 2 PUFFS INTO THE LUNGS EVERY 6 HOURS AS NEEDED FOR SHORTNESS OF BREATH 11/09/17  Yes [provider]  spironolactone (ALDACTONE) 50 MG tablet Take 2 tablets (100 mg total) by mouth 2 (two) times daily. 10/28/17  Yes Dustin Flock, MD  traZODone (DESYREL) 50 MG tablet Take 50 mg by mouth at bedtime.   Yes [provider]  XIFAXAN 550 MG TABS tablet Take 1 tablet by mouth 2 (two) times daily. 06/10/17  Yes [provider]  furosemide (LASIX) 40 MG tablet Take 2 tablets (80 mg total) by mouth 3 (three) times daily. Patient not taking: Reported on 11/30/2017 11/29/17   Daleen Bo, MD   Current Facility-Administered Medications:  .  0.9 %  sodium chloride infusion, , Intravenous, Continuous, Sainani, Belia Heman, MD, Last Rate: 75 mL/hr at 12/01/17 0849 .  acetaminophen (TYLENOL) tablet 650 mg, 650 mg, Oral, Q6H PRN **OR** acetaminophen (TYLENOL) suppository 650 mg, 650 mg, Rectal, Q6H PRN, Sainani, Vivek J, MD .  albumin human 25 % solution 25 g, 25 g, Intravenous, Q8H, Lateef, Munsoor, MD, Last Rate: 60 mL/hr at 12/01/17 1435, 25 g  at 12/01/17 1435 .  albuterol (PROVENTIL) (2.5 MG/3ML) 0.083% nebulizer solution 2.5 mg, 2.5 mg, Nebulization, Q4H PRN, Sainani, Vivek J, MD .  ARIPiprazole (ABILIFY) tablet 5 mg, 5 mg, Oral, QHS, Sainani, Belia Heman, MD, 5 mg at 11/30/17 2207 .  ciprofloxacin (CIPRO) tablet 500 mg, 500 mg, Oral, Daily, Henreitta Leber, MD, 500 mg at 12/01/17 1042 .  citalopram (CELEXA) tablet 20 mg, 20 mg, Oral, QHS, Sainani, Belia Heman, MD, 20 mg at 11/30/17 2207 .  insulin aspart (novoLOG) injection 0-9 Units, 0-9 Units, Subcutaneous, Q6H, Dustin Flock, MD, 1 Units at 12/01/17 1242 .  lactulose (CHRONULAC) 10 GM/15ML solution 30 g, 30 g, Oral, BID, Sainani, Belia Heman, MD, 30 g at 12/01/17 1043 .  midodrine (PROAMATINE) tablet 10 mg, 10 mg, Oral, BID WC, Lateef, Munsoor, MD .  morphine (MS CONTIN) 12 hr tablet 30 mg, 30 mg, Oral, Daily, Sainani, Belia Heman, MD, 30 mg at 12/01/17 1042 .  ondansetron (ZOFRAN) tablet 4 mg, 4 mg, Oral,  Q6H PRN **OR** ondansetron (ZOFRAN) injection 4 mg, 4 mg, Intravenous, Q6H PRN, Sainani, Vivek J, MD .  pioglitazone (ACTOS) tablet 30 mg, 30 mg, Oral, Daily, Dustin Flock, MD, 30 mg at 12/01/17 1251 .  rifaximin (XIFAXAN) tablet 550 mg, 550 mg, Oral, BID, Henreitta Leber, MD, 550 mg at 12/01/17 1042 .  traMADol (ULTRAM) tablet 50 mg, 50 mg, Oral, Q12H PRN, Lance Coon, MD, 50 mg at 11/30/17 2207 .  traZODone (DESYREL) tablet 50 mg, 50 mg, Oral, QHS, Sainani, Belia Heman, MD, 50 mg at 11/30/17 2207   Family History  Problem Relation Age of Onset  . Diabetes Mother   . Cancer Father   . Diabetes Sister   . Diabetes Brother      Social History   Tobacco Use  . Smoking status: Former Smoker    Packs/day: 0.50    Types: Cigarettes  . Smokeless tobacco: Never Used  Substance Use Topics  . Alcohol use: Not Currently  . Drug use: No    Allergies as of 11/30/2017 - Review Complete 11/30/2017  Allergen Reaction Noted  . Tizanidine Other (See Comments) 11/09/2016    Review of  Systems:    All systems reviewed and negative except where noted in HPI.   Physical Exam:  Vital signs in last 24 hours: Temp:  [97.4 F (36.3 C)-97.8 F (36.6 C)] 97.4 F (36.3 C) (07/11 1430) Pulse Rate:  [63-81] 68 (07/11 1430) Resp:  [14-20] 16 (07/11 1430) BP: (91-106)/(40-66) 99/52 (07/11 1430) SpO2:  [99 %-100 %] 99 % (07/11 1430) Last BM Date: 11/30/17 General:  Ill-appearing, drowsy, arousable, in NAD Head:  Normocephalic and atraumatic. Eyes:  positive icterus.   Conjunctiva pink. PERRLA. Ears:  Normal auditory acuity. Neck:  Supple; no masses or thyroidomegaly Lungs: Respirations even and unlabored. Lungs clear to auscultation bilaterally.   No wheezes, crackles, or rhonchi.  Heart:  Regular rate and rhythm;  Without murmur, clicks, rubs or gallops Abdomen:  Soft, grossly distended, dull to percussion, nontender. Normal bowel sounds. No appreciable masses or hepatomegaly.  No rebound or guarding.  Rectal:  Not performed. Msk:  Symmetrical without gross deformities.  Strength generalized weakness  Extremities:  Without edema, cyanosis or clubbing. Neurologic:  Alert and oriented x3;  grossly normal neurologically. Skin:  Intact without significant lesions or rashes. Psych:  lethargic and cooperative. Normal affect.  LAB RESULTS: CBC Latest Ref Rng & Units 12/01/2017 11/30/2017 11/29/2017  WBC 3.6 - 11.0 K/uL 5.7 5.3 5.0  Hemoglobin 12.0 - 16.0 g/dL 10.3(L) 10.9(L) 9.7(L)  Hematocrit 35.0 - 47.0 % 29.6(L) 31.5(L) 27.6(L)  Platelets 150 - 440 K/uL 82(L) 80(L) 69(L)    BMET BMP Latest Ref Rng & Units 12/01/2017 11/30/2017 11/29/2017  Glucose 70 - 99 mg/dL 138(H) 203(H) 172(H)  BUN 6 - 20 mg/dL 47(H) 44(H) 43(H)  Creatinine 0.44 - 1.00 mg/dL 1.89(H) 1.80(H) 1.62(H)  Sodium 135 - 145 mmol/L 119(LL) 117(LL) 119(LL)  Potassium 3.5 - 5.1 mmol/L 4.6 4.6 5.0  Chloride 98 - 111 mmol/L 86(L) 82(L) 83(L)  CO2 22 - 32 mmol/L 26 26 26   Calcium 8.9 - 10.3 mg/dL 8.9 9.3 9.0     LFT Hepatic Function Latest Ref Rng & Units 11/30/2017 11/29/2017 11/13/2017  Total Protein 6.5 - 8.1 g/dL 6.4(L) 5.6(L) 6.6  Albumin 3.5 - 5.0 g/dL 3.1(L) 2.7(L) 3.6  AST 15 - 41 U/L 68(H) 56(H) 66(H)  ALT 0 - 44 U/L 33 31 27  Alk Phosphatase 38 - 126 U/L 170(H) 169(H)  145(H)  Total Bilirubin 0.3 - 1.2 mg/dL 4.4(H) 3.8(H) 6.0(H)  Bilirubin, Direct 0.1 - 0.5 mg/dL - - 2.3(H)     STUDIES: US Renal  Result Date: 12/01/2017 CLINICAL DATA:  Acute renal failure EXAM: RENAL / URINARY TRACT ULTRASOUND COMPLETE COMPARISON:  Limited ultrasound the abdomen of 11/09/2017 FINDINGS: Right Kidney: Length: 12.9 cm. No hydronephrosis is seen. The parenchyma may be slightly echogenic. Left Kidney: Length: 11.7 cm.  No hydronephrosis is seen. There is a moderate amount of ascites present throughout all 4 quadrants Bladder: The urinary bladder is moderately well distended with no abnormality noted. IMPRESSION: 1. Moderate amount of ascites throughout the peritoneal cavity. 2. No hydronephrosis. Electronically Signed   By: Ivar Drape M.D.   On: 12/01/2017 15:37      Impression / Plan:   Jannis Atkins is a 52 y.o. 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 hepatic encephalopathy who is admitted with severe hyponatremia and worsening altered mental status  Severe hyponatremia and acute renal failure: Probably secondary to high-dose diuretics and serial paracentesis Nephrology is on board Continue normal saline and IV albumin Monitor serial serum sodium May need octreotide and midodrine if her serum sodium and ARF not responding to IV hydration Continue to hold diuretics Monitor strict urine output  Hepatic encephalopathy: Continue rifaximin 550 mg twice daily  Refractory ascites: Patient will not be a TIPS candidate due to hepatic encephalopathy Paracentesis as needed Scheduled for paracentesis tomorrow, recommend fluid analysis to rule out  SBP  Decompensated cirrhosis: child class C, meld sodium 29 - Currently undergoing liver transplant evaluation at [place] with transplant hepatology services after discharge  Thank you for involving me in the care of this patient.  Will follow along with you    LOS: 1 day   Sherri Sear, MD  12/01/2017, 5:07 PM   Note: This dictation was prepared with Dragon dictation along with smaller phrase technology. Any transcriptional errors that result from this process are unintentional.

## 2017-12-01 NOTE — Progress Notes (Signed)
Advanced care plan.  Purpose of the Encounter: CODE STATUS  Parties in Attendance: patient  Patient's Decision Capacity:intact  Subjective/Patient's story: Katrina Cohen  is a 52 y.o. female with a known history of chronic liver disease secondary to alcohol abuse, diabetes, bipolar disorder, anxiety/depression who presents to the hospital due to dizziness, altered mental status and noted to be severely hyponatremic.     Objective/Medical story  I discussed with patient regarding her wishes to be resuscitated and intbuated, she states she wants to live and want everything done   Goals of care determination:  Full code   CODE STATUS: full code   Time spent discussing advanced care planning: 16 minutes

## 2017-12-02 ENCOUNTER — Ambulatory Visit (HOSPITAL_COMMUNITY): Admission: RE | Admit: 2017-12-02 | Payer: Medicaid Other | Source: Ambulatory Visit

## 2017-12-02 ENCOUNTER — Inpatient Hospital Stay: Payer: Medicaid Other

## 2017-12-02 LAB — CBC
HCT: 24.8 % — ABNORMAL LOW (ref 35.0–47.0)
Hemoglobin: 8.7 g/dL — ABNORMAL LOW (ref 12.0–16.0)
MCH: 31.4 pg (ref 26.0–34.0)
MCHC: 34.9 g/dL (ref 32.0–36.0)
MCV: 90 fL (ref 80.0–100.0)
PLATELETS: 47 10*3/uL — AB (ref 150–440)
RBC: 2.76 MIL/uL — AB (ref 3.80–5.20)
RDW: 16.8 % — ABNORMAL HIGH (ref 11.5–14.5)
WBC: 4.8 10*3/uL (ref 3.6–11.0)

## 2017-12-02 LAB — GLUCOSE, CAPILLARY
GLUCOSE-CAPILLARY: 133 mg/dL — AB (ref 70–99)
GLUCOSE-CAPILLARY: 169 mg/dL — AB (ref 70–99)
GLUCOSE-CAPILLARY: 116 mg/dL — AB (ref 70–99)
Glucose-Capillary: 122 mg/dL — ABNORMAL HIGH (ref 70–99)

## 2017-12-02 LAB — BODY FLUID CELL COUNT WITH DIFFERENTIAL
Eos, Fluid: 0 %
LYMPHS FL: 32 %
MONOCYTE-MACROPHAGE-SEROUS FLUID: 35 %
Neutrophil Count, Fluid: 33 %
Other Cells, Fluid: 0 %
Total Nucleated Cell Count, Fluid: 77 cu mm

## 2017-12-02 LAB — ALBUMIN, PLEURAL OR PERITONEAL FLUID: Albumin, Fluid: <1 g/dL

## 2017-12-02 LAB — PROTEIN, PLEURAL OR PERITONEAL FLUID: Total protein, fluid: <3 g/dL

## 2017-12-02 LAB — BASIC METABOLIC PANEL
Anion gap: 11 (ref 5–15)
BUN: 50 mg/dL — ABNORMAL HIGH (ref 6–20)
CO2: 21 mmol/L — ABNORMAL LOW (ref 22–32)
Calcium: 8.9 mg/dL (ref 8.9–10.3)
Chloride: 90 mmol/L — ABNORMAL LOW (ref 98–111)
Creatinine, Ser: 1.76 mg/dL — ABNORMAL HIGH (ref 0.44–1.00)
GFR, EST AFRICAN AMERICAN: 37 mL/min — AB (ref >60)
GFR, EST NON AFRICAN AMERICAN: 32 mL/min — AB (ref >60)
Glucose, Bld: 131 mg/dL — ABNORMAL HIGH (ref 70–99)
POTASSIUM: 4.4 mmol/L (ref 3.5–5.1)
SODIUM: 122 mmol/L — AB (ref 135–145)

## 2017-12-02 NOTE — Progress Notes (Signed)
Katrina Repressohini R Mclane Arora, MD [address]  [address]  [place], [place] [address]  Main: [phone]  Fax: [phone] Pager: [phone]   Subjective: She is sitting in bed, eating lunch, alert and coherent. She underwent paracentesis and 4L of fluid was removed.   Objective: Vital signs in last 24 hours: Vitals:   12/02/17 1142 12/02/17 1219 12/02/17 1225 12/02/17 1333  BP: 127/63 (!) 92/53 (!) 90/54 (!) 94/50  Pulse: 80 78 79 77  Resp:    18  Temp:    (!) 97.5 F (36.4 C)  TempSrc:    Oral  SpO2: 96% 94% 95% 100%  Weight:      Height:       Weight change:   Intake/Output Summary (Last 24 hours) at 12/02/2017 1608 Last data filed at 12/02/2017 1302 Gross per 24 hour  Intake 1511 ml  Output 200 ml  Net 1311 ml     Exam: Heart:: Regular rate and rhythm or S1S2 present Lungs: normal Abdomen: soft, nontender, diffusely distended, dull to percussion, normal bowel sounds   Lab Results: CBC Latest Ref Rng & Units 12/02/2017 12/01/2017 11/30/2017  WBC 3.6 - 11.0 K/uL 4.8 5.7 5.3  Hemoglobin 12.0 - 16.0 g/dL 3.2(G8.7(L) 10.3(L) 10.9(L)  Hematocrit 35.0 - 47.0 % 24.8(L) 29.6(L) 31.5(L)  Platelets 150 - 440 K/uL 47(L) 82(L) 80(L)   CMP Latest Ref Rng & Units 12/02/2017 12/01/2017 11/30/2017  Glucose 70 - 99 mg/dL 401(U131(H) 272(Z138(H) 366(Y203(H)  BUN 6 - 20 mg/dL 40(H50(H) 47(Q47(H) 25(Z44(H)  Creatinine 0.44 - 1.00 mg/dL 5.63(O1.76(H) 7.56(E1.89(H) 3.32(R1.80(H)  Sodium 135 - 145 mmol/L 122(L) 119(LL) 117(LL)  Potassium 3.5 - 5.1 mmol/L 4.4 4.6 4.6  Chloride 98 - 111 mmol/L 90(L) 86(L) 82(L)  CO2 22 - 32 mmol/L 21(L) 26 26  Calcium 8.9 - 10.3 mg/dL 8.9 8.9 9.3  Total Protein 6.5 - 8.1 g/dL - - 6.4(L)  Total Bilirubin 0.3 - 1.2 mg/dL - - 4.4(H)  Alkaline Phos 38 - 126 U/L - - 170(H)  AST 15 - 41 U/L - - 68(H)  ALT 0 - 44 U/L - - 33    Micro Results: Recent Results (from the past 240 hour(s))  Body fluid culture     Status: None   Collection Time: 11/25/17 11:24 AM  Result Value Ref Range Status   Specimen  Description FLUID PERITONEAL  Final   Special Requests NONE  Final   Gram Stain   Final    FEW WBC PRESENT,BOTH PMN AND MONONUCLEAR NO ORGANISMS SEEN    Culture   Final    NO GROWTH 3 DAYS Performed at [facility], [address] [place], [place] [address]    Report Status 11/28/2017 FINAL  Final   Studies/Results: [place] Renal  Result Date: 12/01/2017 CLINICAL DATA:  Acute renal failure EXAM: RENAL / URINARY TRACT ULTRASOUND COMPLETE COMPARISON:  Limited ultrasound the abdomen of 11/09/2017 FINDINGS: Right Kidney: Length: 12.9 cm. No hydronephrosis is seen. The parenchyma may be slightly echogenic. Left Kidney: Length: 11.7 cm.  No hydronephrosis is seen. There is a moderate amount of ascites present throughout all 4 quadrants Bladder: The urinary bladder is moderately well distended with no abnormality noted. IMPRESSION: 1. Moderate amount of ascites throughout the peritoneal cavity. 2. No hydronephrosis. Electronically Signed   By: Dwyane DeePaul  Barry M.D.   On: 12/01/2017 15:37   [place] Paracentesis  Result Date: 12/02/2017 INDICATION: Ascites EXAM: ULTRASOUND GUIDED  PARACENTESIS MEDICATIONS: None. COMPLICATIONS: None immediate. PROCEDURE: Informed written  consent was obtained from the patient after a discussion of the risks, benefits and alternatives to treatment. A timeout was performed prior to the initiation of the procedure. Initial ultrasound scanning demonstrates a large amount of ascites within the right lower abdominal quadrant. The right lower abdomen was prepped and draped in the usual sterile fashion. 1% lidocaine with epinephrine was used for local anesthesia. Following this, a 6 Fr Safe-T-Centesis catheter was introduced. An ultrasound image was saved for documentation purposes. The paracentesis was performed. The catheter was removed and a dressing was applied. The patient tolerated the procedure well without immediate post procedural complication. FINDINGS: A total of approximately 4  L of amber colored fluid was removed. IMPRESSION: Successful ultrasound-guided paracentesis yielding 4 liters of peritoneal fluid. Electronically Signed   By: Jolaine Click M.D.   On: 12/02/2017 13:57   Medications: I have reviewed the patient's current medications. Scheduled Meds: . ARIPiprazole  5 mg Oral QHS  . ciprofloxacin  500 mg Oral Daily  . citalopram  20 mg Oral QHS  . insulin aspart  0-5 Units Subcutaneous QHS  . insulin aspart  0-9 Units Subcutaneous TID WC  . lactulose  30 g Oral BID  . midodrine  10 mg Oral BID WC  . morphine  30 mg Oral Daily  . pioglitazone  30 mg Oral Daily  . rifaximin  550 mg Oral BID  . traZODone  50 mg Oral QHS   Continuous Infusions: . sodium chloride 75 mL/hr at 12/02/17 1259  . albumin human Stopped (12/02/17 1447)   PRN Meds:.acetaminophen **OR** acetaminophen, albuterol, ondansetron **OR** ondansetron (ZOFRAN) IV, traMADol   Assessment: Active Problems:   Hyponatremia  decompensated cirrhosis of liver with refractory ascites admitted with altered mental status secondary to severe hyponatremia Hyponatremia in the setting of dehydration secondary to high dose diuretic use AKI - BUN/Cr unchanged since admission  Plan: Continue IV hydration with albumin and normal saline per nephrology Started on midodrine Highly recommend to discontinue diuretics upon discharge as patient's ascites is refractory to diuretics and requiring serial paracentesis Monitor serum sodium levels and urine output closely No evidence of SBP based on the fluid analysis Continue rifaximin Continue ciprofloxacin for SBP prophylaxis Follow up with liver transplant team at [facility] after discharge  GI will sign off, please call back with questions   LOS: 2 days   Norvell Ureste 12/02/2017, 4:08 PM

## 2017-12-02 NOTE — Progress Notes (Signed)
[facility] - [place] [facility] [place]                                                                                                                                                                                  Patient Demographics   Boots Mcglown, is a 52 y.o. female, DOB - 08/31/1965, ZOX:[id number]  Admit date - 11/30/2017   Admitting Physician Houston Siren, MD  Outpatient Primary MD for the patient is [place], [place] E, PA-C   LOS - 2  Subjective:   Patient complains of abdominal distention Feeling better otherwise   Review of Systems:   CONSTITUTIONAL: No documented fever. No fatigue, weakness. No weight gain, no weight loss.  EYES: No blurry or double vision.  ENT: No tinnitus. No postnasal drip. No redness of the oropharynx.  RESPIRATORY: No cough, no wheeze, no hemoptysis. No dyspnea.  CARDIOVASCULAR: No chest pain. No orthopnea. No palpitations. No syncope.  GASTROINTESTINAL: No nausea, no vomiting or diarrhea. No abdominal pain. No melena or hematochezia.  Abdominal distention GENITOURINARY: No dysuria or hematuria.  ENDOCRINE: No polyuria or nocturia. No heat or cold intolerance.  HEMATOLOGY: No anemia. No bruising. No bleeding.  INTEGUMENTARY: No rashes. No lesions.  MUSCULOSKELETAL: No arthritis. No swelling. No gout.  NEUROLOGIC: No numbness, tingling, or ataxia. No seizure-type activity.  PSYCHIATRIC: No anxiety. No insomnia. No ADD.    Vitals:   Vitals:   12/02/17 1142 12/02/17 1219 12/02/17 1225 12/02/17 1333  BP: 127/63 (!) 92/53 (!) 90/54 (!) 94/50  Pulse: 80 78 79 77  Resp:    18  Temp:    (!) 97.5 F (36.4 C)  TempSrc:    Oral  SpO2: 96% 94% 95% 100%  Weight:      Height:        Wt Readings from Last 3 Encounters:  11/30/17 82.6 kg (182 lb)  11/29/17 82.6 kg (182 lb)  11/13/17 82.8 kg (182 lb 8 oz)     Intake/Output Summary (Last 24 hours) at 12/02/2017 1408 Last data filed at 12/02/2017 1302 Gross per 24 hour   Intake 1511 ml  Output 200 ml  Net 1311 ml    Physical Exam:   GENERAL: Pleasant-appearing in no apparent distress.  HEAD, EYES, EARS, NOSE AND THROAT: Atraumatic, normocephalic. Extraocular muscles are intact. Pupils equal and reactive to light. Sclerae anicteric. No conjunctival injection. No oro-pharyngeal erythema.  NECK: Supple. There is no jugular venous distention. No bruits, no lymphadenopathy, no thyromegaly.  HEART: Regular rate and rhythm,. No murmurs, no rubs, no clicks.  LUNGS: Clear to auscultation bilaterally. No rales or rhonchi. No wheezes.  ABDOMEN: Soft, flat, nontender, abdominal distention has good  bowel sounds. No hepatosplenomegaly appreciated.  EXTREMITIES: No evidence of any cyanosis, clubbing, or peripheral edema.  +2 pedal and radial pulses bilaterally.  NEUROLOGIC: The patient is alert, awake, and oriented x3 with no focal motor or sensory deficits appreciated bilaterally.  SKIN: Moist and warm with no rashes appreciated.  Psych: Not anxious, depressed LN: No inguinal LN enlargement    Antibiotics   Anti-infectives (From admission, onward)   Start     Dose/Rate Route Frequency Ordered Stop   11/30/17 2200  rifaximin (XIFAXAN) tablet 550 mg     550 mg Oral 2 times daily 11/30/17 1923     11/30/17 1930  ciprofloxacin (CIPRO) tablet 500 mg     500 mg Oral Daily 11/30/17 1923        Medications   Scheduled Meds: . ARIPiprazole  5 mg Oral QHS  . ciprofloxacin  500 mg Oral Daily  . citalopram  20 mg Oral QHS  . insulin aspart  0-5 Units Subcutaneous QHS  . insulin aspart  0-9 Units Subcutaneous TID WC  . lactulose  30 g Oral BID  . midodrine  10 mg Oral BID WC  . morphine  30 mg Oral Daily  . pioglitazone  30 mg Oral Daily  . rifaximin  550 mg Oral BID  . traZODone  50 mg Oral QHS   Continuous Infusions: . sodium chloride 75 mL/hr at 12/02/17 1259  . albumin human 25 g (12/02/17 1259)   PRN Meds:.acetaminophen **OR** acetaminophen, albuterol,  ondansetron **OR** ondansetron (ZOFRAN) IV, traMADol   Data Review:   Micro Results Recent Results (from the past 240 hour(s))  Body fluid culture     Status: None   Collection Time: 11/25/17 11:24 AM  Result Value Ref Range Status   Specimen Description FLUID PERITONEAL  Final   Special Requests NONE  Final   Gram Stain   Final    FEW WBC PRESENT,BOTH PMN AND MONONUCLEAR NO ORGANISMS SEEN    Culture   Final    NO GROWTH 3 DAYS Performed at [facility], [address] [place], [place] [address]    Report Status 11/28/2017 FINAL  Final    Radiology Reports [place] Renal  Result Date: 12/01/2017 CLINICAL DATA:  Acute renal failure EXAM: RENAL / URINARY TRACT ULTRASOUND COMPLETE COMPARISON:  Limited ultrasound the abdomen of 11/09/2017 FINDINGS: Right Kidney: Length: 12.9 cm. No hydronephrosis is seen. The parenchyma may be slightly echogenic. Left Kidney: Length: 11.7 cm.  No hydronephrosis is seen. There is a moderate amount of ascites present throughout all 4 quadrants Bladder: The urinary bladder is moderately well distended with no abnormality noted. IMPRESSION: 1. Moderate amount of ascites throughout the peritoneal cavity. 2. No hydronephrosis. Electronically Signed   By: Dwyane DeePaul  Barry M.D.   On: 12/01/2017 15:37   [place] Abdomen Limited  Result Date: 11/09/2017 CLINICAL DATA:  Abdominal distension EXAM: LIMITED ABDOMEN ULTRASOUND FOR ASCITES TECHNIQUE: Limited ultrasound survey for ascites was performed in all four abdominal quadrants. COMPARISON:  11/08/2017 FINDINGS: Mild ascites is noted in all 4 quadrants although no sizable pocket to allow for safe paracentesis is noted at this time. Given the fact the patient had a 9 L drainage yesterday, paracentesis should be delayed for at least 1 week. IMPRESSION: Mild ascites. Recommend paracentesis in 1 week or when patient is significantly clinically symptomatic. Electronically Signed   By: Alcide CleverMark  Lukens M.D.   On: 11/09/2017 15:28   [place]  Paracentesis  Result Date: 12/02/2017 INDICATION: Ascites  EXAM: ULTRASOUND GUIDED  PARACENTESIS MEDICATIONS: None. COMPLICATIONS: None immediate. PROCEDURE: Informed written consent was obtained from the patient after a discussion of the risks, benefits and alternatives to treatment. A timeout was performed prior to the initiation of the procedure. Initial ultrasound scanning demonstrates a large amount of ascites within the right lower abdominal quadrant. The right lower abdomen was prepped and draped in the usual sterile fashion. 1% lidocaine with epinephrine was used for local anesthesia. Following this, a 6 Fr Safe-T-Centesis catheter was introduced. An ultrasound image was saved for documentation purposes. The paracentesis was performed. The catheter was removed and a dressing was applied. The patient tolerated the procedure well without immediate post procedural complication. FINDINGS: A total of approximately 4 L of amber colored fluid was removed. IMPRESSION: Successful ultrasound-guided paracentesis yielding 4 liters of peritoneal fluid. Electronically Signed   By: Jolaine Click M.D.   On: 12/02/2017 13:57   US Paracentesis  Result Date: 11/14/2017 INDICATION: Cirrhosis, recurrent ascites, abdominal distension EXAM: ULTRASOUND GUIDED RIGHT PARACENTESIS MEDICATIONS: 1% LIDOCAINE LOCAL COMPLICATIONS: None immediate. PROCEDURE: Informed written consent was obtained from the patient after a discussion of the risks, benefits and alternatives to treatment. A timeout was performed prior to the initiation of the procedure. Initial ultrasound scanning demonstrates a large amount of ascites within the right lower abdominal quadrant. The right lower abdomen was prepped and draped in the usual sterile fashion. 1% lidocaine with epinephrine was used for local anesthesia. Following this, a 6 Fr Safe-T-Centesis catheter was introduced. An ultrasound image was saved for documentation purposes. The paracentesis was  performed. The catheter was removed and a dressing was applied. The patient tolerated the procedure well without immediate post procedural complication. FINDINGS: A total of approximately 5.1 L of clear peritoneal fluid was removed. Sample was not sent for laboratory analysis IMPRESSION: Successful ultrasound-guided paracentesis yielding 5.1 L liters of peritoneal fluid. Electronically Signed   By: Judie Petit.  Shick M.D.   On: 11/14/2017 11:06   Ir Paracentesis  Result Date: 11/28/2017 INDICATION: Patient with recurrent ascites. Request is made for diagnostic and therapeutic paracentesis. Patient to receive albumin postprocedure. EXAM: ULTRASOUND GUIDED DIAGNOSTIC AND THERAPEUTIC PARACENTESIS MEDICATIONS: 10 mL 2% lidocaine COMPLICATIONS: None immediate. PROCEDURE: Informed written consent was obtained from the patient after a discussion of the risks, benefits and alternatives to treatment. A timeout was performed prior to the initiation of the procedure. Initial ultrasound scanning demonstrates a large amount of ascites within the right lower abdominal quadrant. The right lower abdomen was prepped and draped in the usual sterile fashion. 2% lidocaine was used for local anesthesia. Following this, a 19 gauge, 7-cm, Yueh catheter was introduced. An ultrasound image was saved for documentation purposes. The paracentesis was performed. The catheter was removed and a dressing was applied. The patient tolerated the procedure well without immediate post procedural complication. FINDINGS: A total of approximately 5.8 liters of yellow fluid was removed. Samples were sent to the laboratory as requested by the clinical team. IMPRESSION: Successful ultrasound-guided diagnostic and therapeutic paracentesis yielding 5.8 liters of peritoneal fluid. Read by: Loyce Dys PA-C Electronically Signed   By: Simonne Come M.D.   On: 11/28/2017 14:21   Ir Paracentesis  Result Date: 11/25/2017 INDICATION: Patient with recurrent ascites.  Request is made for diagnostic and therapeutic paracentesis. Patient to receive albumin if greater than 6 L removed. EXAM: ULTRASOUND GUIDED DIAGNOSTIC AND THERAPEUTIC PARACENTESIS MEDICATIONS: 10 mL 2% lidocaine COMPLICATIONS: None immediate. PROCEDURE: Informed written consent was obtained from the patient after a  discussion of the risks, benefits and alternatives to treatment. A timeout was performed prior to the initiation of the procedure. Initial ultrasound scanning demonstrates a large amount of ascites within the left lateral abdomen. The left lateral abdomen was prepped and draped in the usual sterile fashion. 2% lidocaine was used for local anesthesia. Following this, a 19 gauge, 7-cm, Yueh catheter was introduced. An ultrasound image was saved for documentation purposes. The paracentesis was performed. The catheter was removed and a dressing was applied. The patient tolerated the procedure well without immediate post procedural complication. FINDINGS: A total of approximately 5.5 liters of yellow fluid was removed. Samples were sent to the laboratory as requested by the clinical team. IMPRESSION: Successful ultrasound-guided diagnostic and therapeutic paracentesis yielding 5.5 liters of peritoneal fluid. Read by: Loyce Dys PA-C Electronically Signed   By: Jolaine Click M.D.   On: 11/25/2017 13:20   Ir Paracentesis  Result Date: 11/22/2017 INDICATION: Patient with recurrent ascites. Request is made for diagnostic and therapeutic paracentesis. EXAM: ULTRASOUND GUIDED DIAGNOSTIC AND THERAPEUTIC PARACENTESIS MEDICATIONS: 10 mL 2% lidocaine COMPLICATIONS: None immediate. PROCEDURE: Informed written consent was obtained from the patient after a discussion of the risks, benefits and alternatives to treatment. A timeout was performed prior to the initiation of the procedure. Initial ultrasound scanning demonstrates a large amount of ascites within the left lateral abdomen. The left lateral abdomen was  prepped and draped in the usual sterile fashion. 2% lidocaine was used for local anesthesia. Following this, a 19 gauge, 10-cm, Yueh catheter was introduced. An ultrasound image was saved for documentation purposes. The paracentesis was performed. The catheter was removed and a dressing was applied. The patient tolerated the procedure well without immediate post procedural complication. FINDINGS: A total of approximately 7.4 liters of clear, yellow fluid was removed. Samples were sent to the laboratory as requested by the clinical team. IMPRESSION: Successful ultrasound-guided diagnostic and therapeutic paracentesis yielding 7.4 liters of peritoneal fluid. Read by: Loyce Dys PA-C Electronically Signed   By: Corlis Leak M.D.   On: 11/22/2017 14:07   Ir Paracentesis  Result Date: 11/08/2017 INDICATION: Alcoholic cirrhosis with recurrent ascites. Request for diagnostic and therapeutic paracentesis. EXAM: ULTRASOUND GUIDED PARACENTESIS MEDICATIONS: 2% lidocaine = 10 mL COMPLICATIONS: None immediate. PROCEDURE: Informed written consent was obtained from the patient after a discussion of the risks, benefits and alternatives to treatment. A timeout was performed prior to the initiation of the procedure. Initial ultrasound scanning demonstrates a large amount of ascites within the left lower abdominal quadrant. The left lower abdomen was prepped and draped in the usual sterile fashion. 1% lidocaine with epinephrine was used for local anesthesia. Following this, a 19 gauge, 7-cm, Yueh catheter was introduced. An ultrasound image was saved for documentation purposes. The paracentesis was performed. The catheter was removed and a dressing was applied. The patient tolerated the procedure well without immediate post procedural complication. FINDINGS: A total of approximately 9 liters of cloudy yellow fluid was removed. Samples were sent to the laboratory as requested by the clinical team. IMPRESSION: Successful  ultrasound-guided paracentesis yielding 9 liters of peritoneal fluid. Read by: Corrin Parker, PA-C Electronically Signed   By: Judie Petit.  Shick M.D.   On: 11/08/2017 10:42     CBC Recent Labs  Lab 11/29/17 2043 11/30/17 1549 12/01/17 0449 12/02/17 0530  WBC 5.0 5.3 5.7 4.8  HGB 9.7* 10.9* 10.3* 8.7*  HCT 27.6* 31.5* 29.6* 24.8*  PLT 69* 80* 82* 47*  MCV 86.3 89.8 89.4 90.0  MCH  30.3 31.2 31.2 31.4  MCHC 35.1 34.8 34.9 34.9  RDW 16.5* 17.0* 16.9* 16.8*  LYMPHSABS 0.6*  --   --   --   MONOABS 0.7  --   --   --   EOSABS 0.1  --   --   --   BASOSABS 0.0  --   --   --     Chemistries  Recent Labs  Lab 11/29/17 2043 11/30/17 1549 12/01/17 0449 12/02/17 0530  NA 119* 117* 119* 122*  K 5.0 4.6 4.6 4.4  CL 83* 82* 86* 90*  CO2 26 26 26  21*  GLUCOSE 172* 203* 138* 131*  BUN 43* 44* 47* 50*  CREATININE 1.62* 1.80* 1.89* 1.76*  CALCIUM 9.0 9.3 8.9 8.9  AST 56* 68*  --   --   ALT 31 33  --   --   ALKPHOS 169* 170*  --   --   BILITOT 3.8* 4.4*  --   --    ------------------------------------------------------------------------------------------------------------------ estimated creatinine clearance is 38.9 mL/min (A) (by C-G formula based on SCr of 1.76 mg/dL (H)). ------------------------------------------------------------------------------------------------------------------ No results for input(s): HGBA1C in the last 72 hours. ------------------------------------------------------------------------------------------------------------------ No results for input(s): CHOL, HDL, LDLCALC, TRIG, CHOLHDL, LDLDIRECT in the last 72 hours. ------------------------------------------------------------------------------------------------------------------ No results for input(s): TSH, T4TOTAL, T3FREE, THYROIDAB in the last 72 hours.  Invalid input(s): FREET3 ------------------------------------------------------------------------------------------------------------------ No results for  input(s): VITAMINB12, FOLATE, FERRITIN, TIBC, IRON, RETICCTPCT in the last 72 hours.  Coagulation profile Recent Labs  Lab 11/30/17 1732  INR 1.51    No results for input(s): DDIMER in the last 72 hours.  Cardiac Enzymes No results for input(s): CKMB, TROPONINI, MYOGLOBIN in the last 168 hours.  Invalid input(s): CK ------------------------------------------------------------------------------------------------------------------ Invalid input(s): POCBNP    Assessment & Plan   52 year old female with past medical history of chronic liver disease secondary to alcohol abuse, hepatic encephalopathy, hypertension, diabetes, anxiety/depression, bipolar disorder who presents to the hospital due to dizziness, and noted to be acutely hyponatremic.  1.  Hyponatremia- Continue albumin and IV fluids Sodium improved   2.  Acute kidney injury-secondary to dehydration from increasing use of diuretics. Improved with IV fluid  3.  Chronic liver disease-secondary to alcohol abuse. - Patient's abdomen is distended consistent with ascites: Empiric Cipro for now possible SBP, discussed with nephrology will remove 4 L of fluid On transplant list at [facility]  4.  Anxiety/depression-continue Celexa, Abilify.  5.  Chronic pain-continue MS Contin.  6.  Thrombocytopenia-chronic secondary to chronic liver disease, no acute bleeding.  Follow platelet count.       Code Status Orders  (From admission, onward)        Start     Ordered   11/30/17 1924  Full code  Continuous     11/30/17 1923    Code Status History    Date Active Date Inactive Code Status Order ID Comments User Context   11/13/2017 0225 11/14/2017 1608 Full Code [id number]  Barbaraann Rondo, MD Inpatient   10/27/2017 0820 10/28/2017 1555 Full Code [id number]  Barbaraann Rondo, MD ED   01/18/2017 2156 01/21/2017 1735 Full Code [id number]  Eduard Clos, MD ED   04/02/2016 0506 04/03/2016 1524 Full Code [id number]  Briscoe Deutscher, MD ED   11/12/2015 1753 11/13/2015 1502 Full Code [id number]  Standley Brooking, MD ED   11/07/2015 0637 11/08/2015 1423 Full Code [id number]  Michael Litter, MD Inpatient   04/28/2015 2111 04/30/2015 2323 Full Code [id number]  Eston Esters, MD ED  06/07/2011 1321 06/08/2011 2157 Full Code [id number]  Dione Booze, MD ED   05/27/2011 0912 05/27/2011 1811 Full Code [id number]  Remi Haggard, NP ED           Consults nephrology and GI  DVT Prophylaxis SCDs  Lab Results  Component Value Date   PLT 47 (L) 12/02/2017     Time Spent in minutes  Greater than 50% of time spent in care coordination and counseling patient regarding the condition and plan of care.   Auburn Bilberry M.D on 12/02/2017 at 2:08 PM  Between 7am to 6pm - Pager - [phone]  After 6pm go to [email or web address] - Social research officer, government  [employer]   Office  [phone]

## 2017-12-03 LAB — GLUCOSE, CAPILLARY
GLUCOSE-CAPILLARY: 121 mg/dL — AB (ref 70–99)
GLUCOSE-CAPILLARY: 154 mg/dL — AB (ref 70–99)
GLUCOSE-CAPILLARY: 129 mg/dL — AB (ref 70–99)
Glucose-Capillary: 168 mg/dL — ABNORMAL HIGH (ref 70–99)

## 2017-12-03 LAB — CBC WITH DIFFERENTIAL/PLATELET
BASOS ABS: 0 10*3/uL (ref 0–0.1)
BASOS PCT: 0 %
Eosinophils Absolute: 0 10*3/uL (ref 0–0.7)
Eosinophils Relative: 1 %
HEMATOCRIT: 21.6 % — AB (ref 35.0–47.0)
Hemoglobin: 7.8 g/dL — ABNORMAL LOW (ref 12.0–16.0)
Lymphocytes Relative: 8 %
Lymphs Abs: 0.3 10*3/uL — ABNORMAL LOW (ref 1.0–3.6)
MCH: 32.2 pg (ref 26.0–34.0)
MCHC: 35.9 g/dL (ref 32.0–36.0)
MCV: 89.7 fL (ref 80.0–100.0)
MONO ABS: 0.6 10*3/uL (ref 0.2–0.9)
Monocytes Relative: 14 %
NEUTROS ABS: 3.3 10*3/uL (ref 1.4–6.5)
Neutrophils Relative %: 77 %
PLATELETS: 42 10*3/uL — AB (ref 150–440)
RBC: 2.41 MIL/uL — ABNORMAL LOW (ref 3.80–5.20)
RDW: 16.8 % — AB (ref 11.5–14.5)
WBC: 4.3 10*3/uL (ref 3.6–11.0)

## 2017-12-03 LAB — BASIC METABOLIC PANEL
ANION GAP: 10 (ref 5–15)
BUN: 55 mg/dL — ABNORMAL HIGH (ref 6–20)
CALCIUM: 8.8 mg/dL — AB (ref 8.9–10.3)
CO2: 23 mmol/L (ref 22–32)
CREATININE: 1.89 mg/dL — AB (ref 0.44–1.00)
Chloride: 93 mmol/L — ABNORMAL LOW (ref 98–111)
GFR calc Af Amer: 34 mL/min — ABNORMAL LOW (ref >60)
GFR, EST NON AFRICAN AMERICAN: 29 mL/min — AB (ref >60)
Glucose, Bld: 126 mg/dL — ABNORMAL HIGH (ref 70–99)
Potassium: 4.6 mmol/L (ref 3.5–5.1)
Sodium: 126 mmol/L — ABNORMAL LOW (ref 135–145)

## 2017-12-03 LAB — OSMOLALITY, URINE: OSMOLALITY UR: 311 mosm/kg (ref 300–900)

## 2017-12-03 LAB — URIC ACID: Uric Acid, Serum: 8.2 mg/dL — ABNORMAL HIGH (ref 2.5–7.1)

## 2017-12-03 LAB — PROTEIN, BODY FLUID (OTHER): TOTAL PROTEIN, BODY FLUID OTHER: 0.7 g/dL

## 2017-12-03 LAB — SODIUM, URINE, RANDOM: Sodium, Ur: <10 mmol/L

## 2017-12-03 LAB — CREATININE, URINE, RANDOM: Creatinine, Urine: 121 mg/dL

## 2017-12-03 LAB — OSMOLALITY: Osmolality: 278 mOsm/kg (ref 275–295)

## 2017-12-03 NOTE — Progress Notes (Signed)
[employer] - [place] at [facility]                                                                                                                                                                                  Patient Demographics   Katrina Cohen, is a 52 y.o. female, DOB - Nov 24, 1965, WGN:[id number]  Admit date - 11/30/2017   Admitting Physician Houston Siren, MD  Outpatient Primary MD for the patient is [place], [place] E, PA-C   LOS - 3  Subjective:   Patient feeling better Wants to go home Had paracentesis yesterday  Review of Systems:   CONSTITUTIONAL: No documented fever. No fatigue, weakness. No weight gain, no weight loss.  EYES: No blurry or double vision.  ENT: No tinnitus. No postnasal drip. No redness of the oropharynx.  RESPIRATORY: No cough, no wheeze, no hemoptysis. No dyspnea.  CARDIOVASCULAR: No chest pain. No orthopnea. No palpitations. No syncope.  GASTROINTESTINAL: No nausea, no vomiting or diarrhea. No abdominal pain. No melena or hematochezia.  Abdominal distention GENITOURINARY: No dysuria or hematuria.  ENDOCRINE: No polyuria or nocturia. No heat or cold intolerance.  HEMATOLOGY: No anemia. No bruising. No bleeding.  INTEGUMENTARY: No rashes. No lesions.  MUSCULOSKELETAL: No arthritis. No swelling. No gout.  NEUROLOGIC: No numbness, tingling, or ataxia. No seizure-type activity.  PSYCHIATRIC: No anxiety. No insomnia. No ADD.    Vitals:   Vitals:   12/02/17 1952 12/02/17 1953 12/03/17 0506 12/03/17 0633  BP: (!) 85/38 (!) 95/46 (!) 87/47 (!) 90/47  Pulse: 93 90 82 78  Resp: (!) 24  18   Temp: 98 F (36.7 C)  97.8 F (36.6 C)   TempSrc: Oral  Oral   SpO2: 97% 100% 95%   Weight:      Height:        Wt Readings from Last 3 Encounters:  11/30/17 82.6 kg (182 lb)  11/29/17 82.6 kg (182 lb)  11/13/17 82.8 kg (182 lb 8 oz)     Intake/Output Summary (Last 24 hours) at 12/03/2017 1244 Last data filed at 12/03/2017 0911 Gross  per 24 hour  Intake 1415 ml  Output 151 ml  Net 1264 ml    Physical Exam:   GENERAL: Pleasant-appearing in no apparent distress.  HEAD, EYES, EARS, NOSE AND THROAT: Atraumatic, normocephalic. Extraocular muscles are intact. Pupils equal and reactive to light. Sclerae anicteric. No conjunctival injection. No oro-pharyngeal erythema.  NECK: Supple. There is no jugular venous distention. No bruits, no lymphadenopathy, no thyromegaly.  HEART: Regular rate and rhythm,. No murmurs, no rubs, no clicks.  LUNGS: Clear to auscultation bilaterally. No rales or rhonchi. No wheezes.  ABDOMEN: Soft, flat,  nontender, abdominal distention has good bowel sounds. No hepatosplenomegaly appreciated.  EXTREMITIES: No evidence of any cyanosis, clubbing, or peripheral edema.  +2 pedal and radial pulses bilaterally.  NEUROLOGIC: The patient is alert, awake, and oriented x3 with no focal motor or sensory deficits appreciated bilaterally.  SKIN: Moist and warm with no rashes appreciated.  Psych: Not anxious, depressed LN: No inguinal LN enlargement    Antibiotics   Anti-infectives (From admission, onward)   Start     Dose/Rate Route Frequency Ordered Stop   11/30/17 2200  rifaximin (XIFAXAN) tablet 550 mg     550 mg Oral 2 times daily 11/30/17 1923     11/30/17 1930  ciprofloxacin (CIPRO) tablet 500 mg     500 mg Oral Daily 11/30/17 1923        Medications   Scheduled Meds: . ARIPiprazole  5 mg Oral QHS  . ciprofloxacin  500 mg Oral Daily  . citalopram  20 mg Oral QHS  . insulin aspart  0-5 Units Subcutaneous QHS  . insulin aspart  0-9 Units Subcutaneous TID WC  . lactulose  30 g Oral BID  . midodrine  10 mg Oral BID WC  . morphine  30 mg Oral Daily  . pioglitazone  30 mg Oral Daily  . rifaximin  550 mg Oral BID  . traZODone  50 mg Oral QHS   Continuous Infusions: . sodium chloride 75 mL/hr at 12/03/17 0226  . albumin human 25 g (12/03/17 1157)   PRN Meds:.acetaminophen **OR** acetaminophen,  albuterol, ondansetron **OR** ondansetron (ZOFRAN) IV, traMADol   Data Review:   Micro Results Recent Results (from the past 240 hour(s))  Body fluid culture     Status: None   Collection Time: 11/25/17 11:24 AM  Result Value Ref Range Status   Specimen Description FLUID PERITONEAL  Final   Special Requests NONE  Final   Gram Stain   Final    FEW WBC PRESENT,BOTH PMN AND MONONUCLEAR NO ORGANISMS SEEN    Culture   Final    NO GROWTH 3 DAYS Performed at [facility], [address], [place], [place] [address]    Report Status 11/28/2017 FINAL  Final  Body fluid culture     Status: None (Preliminary result)   Collection Time: 12/02/17 12:15 PM  Result Value Ref Range Status   Specimen Description   Final    PERITONEAL Performed at [facility], [address], [place], [place] [address]    Special Requests   Final    NONE Performed at [facility], [address] [place] [address] [place], [place] [address]    Gram Stain NO WBC SEEN NO ORGANISMS SEEN   Final   Culture   Final    NO GROWTH < 24 HOURS Performed at [facility], [address] [place], [place] [address]    Report Status PENDING  Incomplete    Radiology Reports US Renal  Result Date: 12/01/2017 CLINICAL DATA:  Acute renal failure EXAM: RENAL / URINARY TRACT ULTRASOUND COMPLETE COMPARISON:  Limited ultrasound the abdomen of 11/09/2017 FINDINGS: Right Kidney: Length: 12.9 cm. No hydronephrosis is seen. The parenchyma may be slightly echogenic. Left Kidney: Length: 11.7 cm.  No hydronephrosis is seen. There is a moderate amount of ascites present throughout all 4 quadrants Bladder: The urinary bladder is moderately well distended with no abnormality noted. IMPRESSION: 1. Moderate amount of ascites throughout the peritoneal cavity. 2. No hydronephrosis. Electronically Signed   By: Renae Fickle  Gery PrayBarry M.D.   On: 12/01/2017 15:37   [place] Abdomen Limited  Result Date: 11/09/2017 CLINICAL DATA:  Abdominal  distension EXAM: LIMITED ABDOMEN ULTRASOUND FOR ASCITES TECHNIQUE: Limited ultrasound survey for ascites was performed in all four abdominal quadrants. COMPARISON:  11/08/2017 FINDINGS: Mild ascites is noted in all 4 quadrants although no sizable pocket to allow for safe paracentesis is noted at this time. Given the fact the patient had a 9 L drainage yesterday, paracentesis should be delayed for at least 1 week. IMPRESSION: Mild ascites. Recommend paracentesis in 1 week or when patient is significantly clinically symptomatic. Electronically Signed   By: Alcide CleverMark  Lukens M.D.   On: 11/09/2017 15:28   [place] Paracentesis  Result Date: 12/02/2017 INDICATION: Ascites EXAM: ULTRASOUND GUIDED  PARACENTESIS MEDICATIONS: None. COMPLICATIONS: None immediate. PROCEDURE: Informed written consent was obtained from the patient after a discussion of the risks, benefits and alternatives to treatment. A timeout was performed prior to the initiation of the procedure. Initial ultrasound scanning demonstrates a large amount of ascites within the right lower abdominal quadrant. The right lower abdomen was prepped and draped in the usual sterile fashion. 1% lidocaine with epinephrine was used for local anesthesia. Following this, a 6 Fr Safe-T-Centesis catheter was introduced. An ultrasound image was saved for documentation purposes. The paracentesis was performed. The catheter was removed and a dressing was applied. The patient tolerated the procedure well without immediate post procedural complication. FINDINGS: A total of approximately 4 L of amber colored fluid was removed. IMPRESSION: Successful ultrasound-guided paracentesis yielding 4 liters of peritoneal fluid. Electronically Signed   By: Jolaine ClickArthur  Hoss M.D.   On: 12/02/2017 13:57   [place] Paracentesis  Result Date: 11/14/2017 INDICATION: Cirrhosis, recurrent ascites, abdominal distension EXAM: ULTRASOUND GUIDED RIGHT PARACENTESIS MEDICATIONS: 1% LIDOCAINE LOCAL COMPLICATIONS: None  immediate. PROCEDURE: Informed written consent was obtained from the patient after a discussion of the risks, benefits and alternatives to treatment. A timeout was performed prior to the initiation of the procedure. Initial ultrasound scanning demonstrates a large amount of ascites within the right lower abdominal quadrant. The right lower abdomen was prepped and draped in the usual sterile fashion. 1% lidocaine with epinephrine was used for local anesthesia. Following this, a 6 Fr Safe-T-Centesis catheter was introduced. An ultrasound image was saved for documentation purposes. The paracentesis was performed. The catheter was removed and a dressing was applied. The patient tolerated the procedure well without immediate post procedural complication. FINDINGS: A total of approximately 5.1 L of clear peritoneal fluid was removed. Sample was not sent for laboratory analysis IMPRESSION: Successful ultrasound-guided paracentesis yielding 5.1 L liters of peritoneal fluid. Electronically Signed   By: Judie PetitM.  Shick M.D.   On: 11/14/2017 11:06   [place] Paracentesis  Result Date: 11/28/2017 INDICATION: Patient with recurrent ascites. Request is made for diagnostic and therapeutic paracentesis. Patient to receive albumin postprocedure. EXAM: ULTRASOUND GUIDED DIAGNOSTIC AND THERAPEUTIC PARACENTESIS MEDICATIONS: 10 mL 2% lidocaine COMPLICATIONS: None immediate. PROCEDURE: Informed written consent was obtained from the patient after a discussion of the risks, benefits and alternatives to treatment. A timeout was performed prior to the initiation of the procedure. Initial ultrasound scanning demonstrates a large amount of ascites within the right lower abdominal quadrant. The right lower abdomen was prepped and draped in the usual sterile fashion. 2% lidocaine was used for local anesthesia. Following this, a 19 gauge, 7-cm, Yueh catheter was introduced. An ultrasound image was saved for documentation purposes. The paracentesis was  performed. The catheter was removed and a dressing  was applied. The patient tolerated the procedure well without immediate post procedural complication. FINDINGS: A total of approximately 5.8 liters of yellow fluid was removed. Samples were sent to the laboratory as requested by the clinical team. IMPRESSION: Successful ultrasound-guided diagnostic and therapeutic paracentesis yielding 5.8 liters of peritoneal fluid. Read by: Loyce Dys PA-C Electronically Signed   By: Simonne Come M.D.   On: 11/28/2017 14:21   Ir Paracentesis  Result Date: 11/25/2017 INDICATION: Patient with recurrent ascites. Request is made for diagnostic and therapeutic paracentesis. Patient to receive albumin if greater than 6 L removed. EXAM: ULTRASOUND GUIDED DIAGNOSTIC AND THERAPEUTIC PARACENTESIS MEDICATIONS: 10 mL 2% lidocaine COMPLICATIONS: None immediate. PROCEDURE: Informed written consent was obtained from the patient after a discussion of the risks, benefits and alternatives to treatment. A timeout was performed prior to the initiation of the procedure. Initial ultrasound scanning demonstrates a large amount of ascites within the left lateral abdomen. The left lateral abdomen was prepped and draped in the usual sterile fashion. 2% lidocaine was used for local anesthesia. Following this, a 19 gauge, 7-cm, Yueh catheter was introduced. An ultrasound image was saved for documentation purposes. The paracentesis was performed. The catheter was removed and a dressing was applied. The patient tolerated the procedure well without immediate post procedural complication. FINDINGS: A total of approximately 5.5 liters of yellow fluid was removed. Samples were sent to the laboratory as requested by the clinical team. IMPRESSION: Successful ultrasound-guided diagnostic and therapeutic paracentesis yielding 5.5 liters of peritoneal fluid. Read by: Loyce Dys PA-C Electronically Signed   By: Jolaine Click M.D.   On: 11/25/2017 13:20   Ir  Paracentesis  Result Date: 11/22/2017 INDICATION: Patient with recurrent ascites. Request is made for diagnostic and therapeutic paracentesis. EXAM: ULTRASOUND GUIDED DIAGNOSTIC AND THERAPEUTIC PARACENTESIS MEDICATIONS: 10 mL 2% lidocaine COMPLICATIONS: None immediate. PROCEDURE: Informed written consent was obtained from the patient after a discussion of the risks, benefits and alternatives to treatment. A timeout was performed prior to the initiation of the procedure. Initial ultrasound scanning demonstrates a large amount of ascites within the left lateral abdomen. The left lateral abdomen was prepped and draped in the usual sterile fashion. 2% lidocaine was used for local anesthesia. Following this, a 19 gauge, 10-cm, Yueh catheter was introduced. An ultrasound image was saved for documentation purposes. The paracentesis was performed. The catheter was removed and a dressing was applied. The patient tolerated the procedure well without immediate post procedural complication. FINDINGS: A total of approximately 7.4 liters of clear, yellow fluid was removed. Samples were sent to the laboratory as requested by the clinical team. IMPRESSION: Successful ultrasound-guided diagnostic and therapeutic paracentesis yielding 7.4 liters of peritoneal fluid. Read by: Loyce Dys PA-C Electronically Signed   By: Corlis Leak M.D.   On: 11/22/2017 14:07   Ir Paracentesis  Result Date: 11/08/2017 INDICATION: Alcoholic cirrhosis with recurrent ascites. Request for diagnostic and therapeutic paracentesis. EXAM: ULTRASOUND GUIDED PARACENTESIS MEDICATIONS: 2% lidocaine = 10 mL COMPLICATIONS: None immediate. PROCEDURE: Informed written consent was obtained from the patient after a discussion of the risks, benefits and alternatives to treatment. A timeout was performed prior to the initiation of the procedure. Initial ultrasound scanning demonstrates a large amount of ascites within the left lower abdominal quadrant. The left  lower abdomen was prepped and draped in the usual sterile fashion. 1% lidocaine with epinephrine was used for local anesthesia. Following this, a 19 gauge, 7-cm, Yueh catheter was introduced. An ultrasound image was saved for documentation purposes. The  paracentesis was performed. The catheter was removed and a dressing was applied. The patient tolerated the procedure well without immediate post procedural complication. FINDINGS: A total of approximately 9 liters of cloudy yellow fluid was removed. Samples were sent to the laboratory as requested by the clinical team. IMPRESSION: Successful ultrasound-guided paracentesis yielding 9 liters of peritoneal fluid. Read by: Corrin Parker, PA-C Electronically Signed   By: Judie Petit.  Shick M.D.   On: 11/08/2017 10:42     CBC Recent Labs  Lab 11/29/17 2043 11/30/17 1549 12/01/17 0449 12/02/17 0530 12/03/17 0609  WBC 5.0 5.3 5.7 4.8 4.3  HGB 9.7* 10.9* 10.3* 8.7* 7.8*  HCT 27.6* 31.5* 29.6* 24.8* 21.6*  PLT 69* 80* 82* 47* 42*  MCV 86.3 89.8 89.4 90.0 89.7  MCH 30.3 31.2 31.2 31.4 32.2  MCHC 35.1 34.8 34.9 34.9 35.9  RDW 16.5* 17.0* 16.9* 16.8* 16.8*  LYMPHSABS 0.6*  --   --   --  0.3*  MONOABS 0.7  --   --   --  0.6  EOSABS 0.1  --   --   --  0.0  BASOSABS 0.0  --   --   --  0.0    Chemistries  Recent Labs  Lab 11/29/17 2043 11/30/17 1549 12/01/17 0449 12/02/17 0530 12/03/17 0609  NA 119* 117* 119* 122* 126*  K 5.0 4.6 4.6 4.4 4.6  CL 83* 82* 86* 90* 93*  CO2 26 26 26  21* 23  GLUCOSE 172* 203* 138* 131* 126*  BUN 43* 44* 47* 50* 55*  CREATININE 1.62* 1.80* 1.89* 1.76* 1.89*  CALCIUM 9.0 9.3 8.9 8.9 8.8*  AST 56* 68*  --   --   --   ALT 31 33  --   --   --   ALKPHOS 169* 170*  --   --   --   BILITOT 3.8* 4.4*  --   --   --    ------------------------------------------------------------------------------------------------------------------ estimated creatinine clearance is 36.2 mL/min (A) (by C-G formula based on SCr of 1.89 mg/dL  (H)). ------------------------------------------------------------------------------------------------------------------ No results for input(s): HGBA1C in the last 72 hours. ------------------------------------------------------------------------------------------------------------------ No results for input(s): CHOL, HDL, LDLCALC, TRIG, CHOLHDL, LDLDIRECT in the last 72 hours. ------------------------------------------------------------------------------------------------------------------ No results for input(s): TSH, T4TOTAL, T3FREE, THYROIDAB in the last 72 hours.  Invalid input(s): FREET3 ------------------------------------------------------------------------------------------------------------------ No results for input(s): VITAMINB12, FOLATE, FERRITIN, TIBC, IRON, RETICCTPCT in the last 72 hours.  Coagulation profile Recent Labs  Lab 11/30/17 1732  INR 1.51    No results for input(s): DDIMER in the last 72 hours.  Cardiac Enzymes No results for input(s): CKMB, TROPONINI, MYOGLOBIN in the last 168 hours.  Invalid input(s): CK ------------------------------------------------------------------------------------------------------------------ Invalid input(s): POCBNP    Assessment & Plan   51 year old female with past medical history of chronic liver disease secondary to alcohol abuse, hepatic encephalopathy, hypertension, diabetes, anxiety/depression, bipolar disorder who presents to the hospital due to dizziness, and noted to be acutely hyponatremic.  1.  Hyponatremia- Continue albumin and IV fluids Sodium improving slowly   2.  Acute kidney injury-secondary to dehydration from increasing use of diuretics. Stable  3.  Chronic liver disease-secondary to alcohol abuse. - Patient's abdomen is distended consistent with ascites: Empiric Cipro for now possible SBP, status post 4 L removed On [facility]  4.  Anxiety/depression-continue Celexa,  Abilify.  5.  Chronic pain-continue MS Contin.  6.  Thrombocytopenia-chronic secondary to chronic liver disease, no acute bleeding.  Follow platelet count.  Code Status Orders  (From admission, onward)        Start     Ordered   11/30/17 1924  Full code  Continuous     11/30/17 1923    Code Status History    Date Active Date Inactive Code Status Order ID Comments User Context   11/13/2017 0225 11/14/2017 1608 Full Code [id number]  Barbaraann Rondo, MD Inpatient   10/27/2017 0820 10/28/2017 1555 Full Code [id number]  Barbaraann Rondo, MD ED   01/18/2017 2156 01/21/2017 1735 Full Code [id number]  Eduard Clos, MD ED   04/02/2016 0506 04/03/2016 1524 Full Code [id number]  Briscoe Deutscher, MD ED   11/12/2015 1753 11/13/2015 1502 Full Code [id number]  Standley Brooking, MD ED   11/07/2015 0637 11/08/2015 1423 Full Code [id number]  Michael Litter, MD Inpatient   04/28/2015 2111 04/30/2015 2323 Full Code [id number]  Eston Esters, MD ED   06/07/2011 1321 06/08/2011 2157 Full Code [id number]  Dione Booze, MD ED   05/27/2011 0912 05/27/2011 1811 Full Code [id number]  Remi Haggard, NP ED           Consults nephrology and GI  DVT Prophylaxis SCDs  Lab Results  Component Value Date   PLT 42 (L) 12/03/2017     Time Spent in minutes  Greater than 50% of time spent in care coordination and counseling patient regarding the condition and plan of care.   Auburn Bilberry M.D on 12/03/2017 at 12:44 PM  Between 7am to 6pm - Pager - [phone]  After 6pm go to [email or web address] - Social research officer, government  [employer]   Office  [phone]

## 2017-12-03 NOTE — Progress Notes (Signed)
[facility]  ROUNDING NOTE   Subjective:  Serum sodium now up to 126. She underwent paracentesis and 4 L of ascites was removed. Patient states that she is feeling better now. Still has early satiety.  But   Objective:  Vital signs in last 24 hours:  Temp:  [97.5 F (36.4 C)-98 F (36.7 C)] 97.8 F (36.6 C) (07/13 0506) Pulse Rate:  [77-93] 78 (07/13 0633) Resp:  [18-24] 18 (07/13 0506) BP: (85-95)/(38-54) 90/47 (07/13 0633) SpO2:  [94 %-100 %] 95 % (07/13 0506)  Weight change:  Filed Weights   11/30/17 1545  Weight: 82.6 kg (182 lb)    Intake/Output: I/O last 3 completed shifts: In: 1511 [I.V.:1331; IV Piggyback:180] Out: 201 [Urine:200; Stool:1]   Intake/Output this shift:  Total I/O In: 1097 [I.V.:1097] Out: 150 [Urine:150]  Physical Exam: General: No acute distress  Head: Normocephalic, atraumatic. Moist oral mucosal membranes  Eyes: Mild icterus  Neck: Supple, trachea midline  Lungs:  Clear to auscultation, normal effort  Heart: S1S2 no rubs  Abdomen:  Soft, nontender, bowel sounds present, distention noted  Extremities: 2+ peripheral edema.  Neurologic: Awake, alert, following commands  Skin: No lesions       Basic Metabolic Panel: Recent Labs  Lab 11/29/17 2043 11/30/17 1549 12/01/17 0449 12/02/17 0530 12/03/17 0609  NA 119* 117* 119* 122* 126*  K 5.0 4.6 4.6 4.4 4.6  CL 83* 82* 86* 90* 93*  CO2 26 26 26  21* 23  GLUCOSE 172* 203* 138* 131* 126*  BUN 43* 44* 47* 50* 55*  CREATININE 1.62* 1.80* 1.89* 1.76* 1.89*  CALCIUM 9.0 9.3 8.9 8.9 8.8*    Liver Function Tests: Recent Labs  Lab 11/29/17 2043 11/30/17 1549  AST 56* 68*  ALT 31 33  ALKPHOS 169* 170*  BILITOT 3.8* 4.4*  PROT 5.6* 6.4*  ALBUMIN 2.7* 3.1*   No results for input(s): LIPASE, AMYLASE in the last 168 hours. Recent Labs  Lab 11/29/17 2043 11/30/17 1703  AMMONIA 16 <9*    CBC: Recent Labs  Lab 11/29/17 2043 11/30/17 1549 12/01/17 0449  12/02/17 0530 12/03/17 0609  WBC 5.0 5.3 5.7 4.8 4.3  NEUTROABS 3.7  --   --   --  3.3  HGB 9.7* 10.9* 10.3* 8.7* 7.8*  HCT 27.6* 31.5* 29.6* 24.8* 21.6*  MCV 86.3 89.8 89.4 90.0 89.7  PLT 69* 80* 82* 47* 42*    Cardiac Enzymes: No results for input(s): CKTOTAL, CKMB, CKMBINDEX, TROPONINI in the last 168 hours.  BNP: Invalid input(s): POCBNP  CBG: Recent Labs  Lab 12/02/17 1253 12/02/17 1636 12/02/17 2130 12/03/17 0803 12/03/17 1150  GLUCAP 169* 116* 122* 121* 154*    Microbiology: Results for orders placed or performed during the hospital encounter of 11/30/17  Body fluid culture     Status: None (Preliminary result)   Collection Time: 12/02/17 12:15 PM  Result Value Ref Range Status   Specimen Description   Final    PERITONEAL Performed at [facility], [address], [place], [place] [address]    Special Requests   Final    NONE Performed at [facility], [address] [place], [place] [address]    Gram Stain NO WBC SEEN NO ORGANISMS SEEN   Final   Culture   Final    NO GROWTH < 24 HOURS Performed at [facility], [address] [place], [place] [address]    Report Status PENDING  Incomplete    Coagulation Studies:  Recent Labs    11/30/17 1732  LABPROT 18.1*  INR 1.51    Urinalysis: Recent Labs    11/30/17 1547  COLORURINE YELLOW*  LABSPEC 1.009  PHURINE 5.0  GLUCOSEU NEGATIVE  HGBUR NEGATIVE  BILIRUBINUR NEGATIVE  KETONESUR NEGATIVE  PROTEINUR NEGATIVE  NITRITE NEGATIVE  LEUKOCYTESUR NEGATIVE      Imaging: US Renal  Result Date: 12/01/2017 CLINICAL DATA:  Acute renal failure EXAM: RENAL / URINARY TRACT ULTRASOUND COMPLETE COMPARISON:  Limited ultrasound the abdomen of 11/09/2017 FINDINGS: Right Kidney: Length: 12.9 cm. No hydronephrosis is seen. The parenchyma may be slightly echogenic. Left Kidney: Length: 11.7 cm.  No hydronephrosis is seen. There is a moderate amount of ascites present throughout all 4  quadrants Bladder: The urinary bladder is moderately well distended with no abnormality noted. IMPRESSION: 1. Moderate amount of ascites throughout the peritoneal cavity. 2. No hydronephrosis. Electronically Signed   By: Dwyane Dee M.D.   On: 12/01/2017 15:37   US Paracentesis  Result Date: 12/02/2017 INDICATION: Ascites EXAM: ULTRASOUND GUIDED  PARACENTESIS MEDICATIONS: None. COMPLICATIONS: None immediate. PROCEDURE: Informed written consent was obtained from the patient after a discussion of the risks, benefits and alternatives to treatment. A timeout was performed prior to the initiation of the procedure. Initial ultrasound scanning demonstrates a large amount of ascites within the right lower abdominal quadrant. The right lower abdomen was prepped and draped in the usual sterile fashion. 1% lidocaine with epinephrine was used for local anesthesia. Following this, a 6 Fr Safe-T-Centesis catheter was introduced. An ultrasound image was saved for documentation purposes. The paracentesis was performed. The catheter was removed and a dressing was applied. The patient tolerated the procedure well without immediate post procedural complication. FINDINGS: A total of approximately 4 L of amber colored fluid was removed. IMPRESSION: Successful ultrasound-guided paracentesis yielding 4 liters of peritoneal fluid. Electronically Signed   By: Jolaine Click M.D.   On: 12/02/2017 13:57     Medications:   . sodium chloride 75 mL/hr at 12/03/17 0226  . albumin human 25 g (12/03/17 1157)   . ARIPiprazole  5 mg Oral QHS  . ciprofloxacin  500 mg Oral Daily  . citalopram  20 mg Oral QHS  . insulin aspart  0-5 Units Subcutaneous QHS  . insulin aspart  0-9 Units Subcutaneous TID WC  . lactulose  30 g Oral BID  . midodrine  10 mg Oral BID WC  . morphine  30 mg Oral Daily  . pioglitazone  30 mg Oral Daily  . rifaximin  550 mg Oral BID  . traZODone  50 mg Oral QHS   acetaminophen **OR** acetaminophen, albuterol,  ondansetron **OR** ondansetron (ZOFRAN) IV, traMADol  Assessment/ Plan:  52 y.o. female with a PMHx of bipolar disorder, cirrhosis of the liver, depression, diabetes mellitus, hepatitis, hypertension, mononucleosis, who was admitted to [facility] on 11/30/2017 for evaluation of weakness.  Upon presentation she was found to have a very low sodium of 117.  1.  Acute renal failure. 2.  Severe hyponatremia.  3.  Cirrhosis of the liver.  4.  Lower extremity edema.  5.  Recurrent ascites.   Plan:  Patient seen at bedside.  Serum sodium has come up to 126.  Continue 0.9 normal saline at 75 cc/h for 1 additional day.  Continue albumin as well.  She did undergo paracentesis yesterday and 4 L of ascites was removed.  Patient apparently has appointment with hepatologist at [facility] on Monday.  Hopefully she will be able to  be discharged prior to this.  Renal function actually slightly worse today with a creatinine of 1.89 and may be related to ascites removal.  Follow-up BMP tomorrow.  Further plan as patient progresses.    LOS: 3 Mellisa Arshad 7/13/201912:17 PM

## 2017-12-04 ENCOUNTER — Ambulatory Visit (HOSPITAL_COMMUNITY)
Admission: AD | Admit: 2017-12-04 | Discharge: 2017-12-04 | Disposition: A | Discharge status: Home / Self Care | Payer: Medicaid Other | Source: Other Acute Inpatient Hospital | Attending: Internal Medicine | Admitting: Internal Medicine

## 2017-12-04 DIAGNOSIS — E871 Hypo-osmolality and hyponatremia: Secondary | ICD-10-CM | POA: Insufficient documentation

## 2017-12-04 DIAGNOSIS — K7031 Alcoholic cirrhosis of liver with ascites: Secondary | ICD-10-CM | POA: Insufficient documentation

## 2017-12-04 DIAGNOSIS — R601 Generalized edema: Secondary | ICD-10-CM | POA: Insufficient documentation

## 2017-12-04 DIAGNOSIS — N179 Acute kidney failure, unspecified: Secondary | ICD-10-CM | POA: Insufficient documentation

## 2017-12-04 LAB — CBC
HEMATOCRIT: 22.3 % — AB (ref 35.0–47.0)
Hemoglobin: 7.7 g/dL — ABNORMAL LOW (ref 12.0–16.0)
MCH: 31.6 pg (ref 26.0–34.0)
MCHC: 34.3 g/dL (ref 32.0–36.0)
MCV: 92 fL (ref 80.0–100.0)
Platelets: 44 10*3/uL — ABNORMAL LOW (ref 150–440)
RBC: 2.42 MIL/uL — AB (ref 3.80–5.20)
RDW: 16.9 % — ABNORMAL HIGH (ref 11.5–14.5)
WBC: 6.7 10*3/uL (ref 3.6–11.0)

## 2017-12-04 LAB — GLUCOSE, CAPILLARY
GLUCOSE-CAPILLARY: 126 mg/dL — AB (ref 70–99)
GLUCOSE-CAPILLARY: 154 mg/dL — AB (ref 70–99)
Glucose-Capillary: 143 mg/dL — ABNORMAL HIGH (ref 70–99)

## 2017-12-04 LAB — BASIC METABOLIC PANEL
Anion gap: 10 (ref 5–15)
BUN: 67 mg/dL — ABNORMAL HIGH (ref 6–20)
CHLORIDE: 93 mmol/L — AB (ref 98–111)
CO2: 21 mmol/L — ABNORMAL LOW (ref 22–32)
Calcium: 8.8 mg/dL — ABNORMAL LOW (ref 8.9–10.3)
Creatinine, Ser: 2.97 mg/dL — ABNORMAL HIGH (ref 0.44–1.00)
GFR calc non Af Amer: 17 mL/min — ABNORMAL LOW (ref >60)
GFR, EST AFRICAN AMERICAN: 20 mL/min — AB (ref >60)
Glucose, Bld: 131 mg/dL — ABNORMAL HIGH (ref 70–99)
POTASSIUM: 5 mmol/L (ref 3.5–5.1)
SODIUM: 124 mmol/L — AB (ref 135–145)

## 2017-12-04 MED ORDER — MIDODRINE HCL 5 MG PO TABS
10.0000 mg | ORAL_TABLET | Freq: Three times a day (TID) | ORAL | Status: DC
  Administered 2017-12-04 (×2): 10 mg via ORAL
  Filled 2017-12-04 (×3): qty 2

## 2017-12-04 MED ORDER — MIDODRINE HCL 10 MG PO TABS: 10.0000 mg | ORAL_TABLET | Freq: Three times a day (TID) | ORAL | Status: AC

## 2017-12-04 MED ORDER — FUROSEMIDE 10 MG/ML IJ SOLN
40.0000 mg | Freq: Two times a day (BID) | INTRAMUSCULAR | Status: DC
  Administered 2017-12-04: 40 mg via INTRAVENOUS
  Filled 2017-12-04: qty 4

## 2017-12-04 MED ORDER — SODIUM CHLORIDE 0.9 % IV SOLN
50.0000 ug/h | INTRAVENOUS | Status: DC
  Filled 2017-12-04 (×2): qty 1

## 2017-12-04 MED ORDER — OCTREOTIDE ACETATE 100 MCG/ML IJ SOLN
100.0000 ug | Freq: Three times a day (TID) | INTRAMUSCULAR | Status: DC
  Administered 2017-12-04: 100 ug via SUBCUTANEOUS
  Filled 2017-12-04 (×2): qty 1

## 2017-12-04 MED ORDER — FUROSEMIDE 10 MG/ML IJ SOLN: 60.0000 mg | Freq: Two times a day (BID) | INTRAMUSCULAR | Status: DC

## 2017-12-04 MED ORDER — OCTREOTIDE ACETATE 100 MCG/ML IJ SOLN: 100.0000 ug | Freq: Three times a day (TID) | INTRAMUSCULAR | Status: AC

## 2017-12-04 NOTE — Progress Notes (Signed)
   12/04/17 1906  Clinical Encounter Type  Visited With Other (Comment)  Visit Type Initial;Spiritual support  Referral From Nurse  Consult/Referral To Chaplain  Spiritual Encounters  Spiritual Needs Prayer;Other (Comment)   CH responded to a RR PG. Care team was actively treating PT. PT appeared to be alert and family was in the RM. CH prayed silently and will follow up if needed.

## 2017-12-04 NOTE — Progress Notes (Addendum)
Pt. C/o sudden SOB using accessory muscles. RR was initiated for support. Pt. Was placed on non breather- BIPAP by RT. Patient maintain her O2 sat at 98%.

## 2017-12-04 NOTE — Progress Notes (Signed)
[facility]. Pt on nonrebreather, pt stable. All belongings with patient and her family members who were given address and phone number of floor to which the pt will be transported. All paperwork sent with [facility].

## 2017-12-04 NOTE — Discharge Summary (Signed)
[employer] -  at [facility], [place] y.o., DOB 03-Apr-1966, MRN [id number]. Admission date: 11/30/2017 Discharge Date 12/04/2017 Primary MD Burnis Medin, PA-C Admitting Physician Houston Siren, MD  Admission Diagnosis  Hyponatremia [E87.1] Weakness [R53.1] Acute kidney injury [facility]) [N17.9]  Discharge Diagnosis   Active Problems:   Hyponatremia Acute renal failure felt to be due to hepatorenal syndrome Decompensated cirrhosis child's class C meld score 29 Refractory ascites Chronic hepatic encephalopathy Severe anasarca    Hospital Course  Katrina Cohen is a 52 y.o. 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  Patient's sodium was very low in admission.  She was seen in consultation by nephrology and started on IV hydration.  She was also noticed to have acute renal failure.  Patient was given IV hydration.  Her sodium has improved.  However her renal function has gotten worse.  Patient's urine sodium was less than 10.  There is concern by nephrology that patient may have hepatorenal syndrome.  They recommend transferring patient to a tertiary care center.  She has been seen at [facility] for transplant evaluation.  Therefore I contacted Dr. Ophelia Charter who has accepted the patient in transfer.  Patient also during the hospitalization did have 4 L of fluid removal due to tense ascites.  She is also started on midodrine and octreotide for possible hepatorenal syndrome. Patient developed shortness of breath and is requiring oxygen now which is new for the patient.  Her chest x-ray was negative.  Her breathing could be related to her abdomen pushing up on the diaphragm.           Consults  GI, nephrology  Significant Tests:  See full reports for all details    US Renal  Result Date:  12/01/2017 CLINICAL DATA:  Acute renal failure EXAM: RENAL / URINARY TRACT ULTRASOUND COMPLETE COMPARISON:  Limited ultrasound the abdomen of 11/09/2017 FINDINGS: Right Kidney: Length: 12.9 cm. No hydronephrosis is seen. The parenchyma may be slightly echogenic. Left Kidney: Length: 11.7 cm.  No hydronephrosis is seen. There is a moderate amount of ascites present throughout all 4 quadrants Bladder: The urinary bladder is moderately well distended with no abnormality noted. IMPRESSION: 1. Moderate amount of ascites throughout the peritoneal cavity. 2. No hydronephrosis. Electronically Signed   By: Dwyane Dee M.D.   On: 12/01/2017 15:37   US Abdomen Limited  Result Date: 11/09/2017 CLINICAL DATA:  Abdominal distension EXAM: LIMITED ABDOMEN ULTRASOUND FOR ASCITES TECHNIQUE: Limited ultrasound survey for ascites was performed in all four abdominal quadrants. COMPARISON:  11/08/2017 FINDINGS: Mild ascites is noted in all 4 quadrants although no sizable pocket to allow for safe paracentesis is noted at this time. Given the fact the patient had a 9 L drainage yesterday, paracentesis should be delayed for at least 1 week. IMPRESSION: Mild ascites. Recommend paracentesis in 1 week or when patient is significantly clinically symptomatic. Electronically Signed   By: Alcide Clever M.D.   On: 11/09/2017 15:28   US Paracentesis  Result Date: 12/02/2017 INDICATION: Ascites EXAM: ULTRASOUND GUIDED  PARACENTESIS MEDICATIONS: None. COMPLICATIONS: None immediate. PROCEDURE: Informed written consent was obtained from the patient after a discussion of the risks, benefits and alternatives to treatment. A timeout was performed prior to the initiation of the procedure. Initial ultrasound scanning demonstrates a large amount of ascites within the right lower abdominal quadrant. The right lower abdomen  was prepped and draped in the usual sterile fashion. 1% lidocaine with epinephrine was used for local anesthesia. Following this, a  6 Fr Safe-T-Centesis catheter was introduced. An ultrasound image was saved for documentation purposes. The paracentesis was performed. The catheter was removed and a dressing was applied. The patient tolerated the procedure well without immediate post procedural complication. FINDINGS: A total of approximately 4 L of amber colored fluid was removed. IMPRESSION: Successful ultrasound-guided paracentesis yielding 4 liters of peritoneal fluid. Electronically Signed   By: Jolaine Click M.D.   On: 12/02/2017 13:57   US Paracentesis  Result Date: 11/14/2017 INDICATION: Cirrhosis, recurrent ascites, abdominal distension EXAM: ULTRASOUND GUIDED RIGHT PARACENTESIS MEDICATIONS: 1% LIDOCAINE LOCAL COMPLICATIONS: None immediate. PROCEDURE: Informed written consent was obtained from the patient after a discussion of the risks, benefits and alternatives to treatment. A timeout was performed prior to the initiation of the procedure. Initial ultrasound scanning demonstrates a large amount of ascites within the right lower abdominal quadrant. The right lower abdomen was prepped and draped in the usual sterile fashion. 1% lidocaine with epinephrine was used for local anesthesia. Following this, a 6 Fr Safe-T-Centesis catheter was introduced. An ultrasound image was saved for documentation purposes. The paracentesis was performed. The catheter was removed and a dressing was applied. The patient tolerated the procedure well without immediate post procedural complication. FINDINGS: A total of approximately 5.1 L of clear peritoneal fluid was removed. Sample was not sent for laboratory analysis IMPRESSION: Successful ultrasound-guided paracentesis yielding 5.1 L liters of peritoneal fluid. Electronically Signed   By: Judie Petit.  Shick M.D.   On: 11/14/2017 11:06   Ir Paracentesis  Result Date: 11/28/2017 INDICATION: Patient with recurrent ascites. Request is made for diagnostic and therapeutic paracentesis. Patient to receive albumin  postprocedure. EXAM: ULTRASOUND GUIDED DIAGNOSTIC AND THERAPEUTIC PARACENTESIS MEDICATIONS: 10 mL 2% lidocaine COMPLICATIONS: None immediate. PROCEDURE: Informed written consent was obtained from the patient after a discussion of the risks, benefits and alternatives to treatment. A timeout was performed prior to the initiation of the procedure. Initial ultrasound scanning demonstrates a large amount of ascites within the right lower abdominal quadrant. The right lower abdomen was prepped and draped in the usual sterile fashion. 2% lidocaine was used for local anesthesia. Following this, a 19 gauge, 7-cm, Yueh catheter was introduced. An ultrasound image was saved for documentation purposes. The paracentesis was performed. The catheter was removed and a dressing was applied. The patient tolerated the procedure well without immediate post procedural complication. FINDINGS: A total of approximately 5.8 liters of yellow fluid was removed. Samples were sent to the laboratory as requested by the clinical team. IMPRESSION: Successful ultrasound-guided diagnostic and therapeutic paracentesis yielding 5.8 liters of peritoneal fluid. Read by: Loyce Dys PA-C Electronically Signed   By: Simonne Come M.D.   On: 11/28/2017 14:21   Ir Paracentesis  Result Date: 11/25/2017 INDICATION: Patient with recurrent ascites. Request is made for diagnostic and therapeutic paracentesis. Patient to receive albumin if greater than 6 L removed. EXAM: ULTRASOUND GUIDED DIAGNOSTIC AND THERAPEUTIC PARACENTESIS MEDICATIONS: 10 mL 2% lidocaine COMPLICATIONS: None immediate. PROCEDURE: Informed written consent was obtained from the patient after a discussion of the risks, benefits and alternatives to treatment. A timeout was performed prior to the initiation of the procedure. Initial ultrasound scanning demonstrates a large amount of ascites within the left lateral abdomen. The left lateral abdomen was prepped and draped in the usual sterile  fashion. 2% lidocaine was used for local anesthesia. Following this, a 19 gauge,  7-cm, Yueh catheter was introduced. An ultrasound image was saved for documentation purposes. The paracentesis was performed. The catheter was removed and a dressing was applied. The patient tolerated the procedure well without immediate post procedural complication. FINDINGS: A total of approximately 5.5 liters of yellow fluid was removed. Samples were sent to the laboratory as requested by the clinical team. IMPRESSION: Successful ultrasound-guided diagnostic and therapeutic paracentesis yielding 5.5 liters of peritoneal fluid. Read by: Loyce DysKacie Matthews PA-C Electronically Signed   By: Jolaine ClickArthur  Hoss M.D.   On: 11/25/2017 13:20   Ir Paracentesis  Result Date: 11/22/2017 INDICATION: Patient with recurrent ascites. Request is made for diagnostic and therapeutic paracentesis. EXAM: ULTRASOUND GUIDED DIAGNOSTIC AND THERAPEUTIC PARACENTESIS MEDICATIONS: 10 mL 2% lidocaine COMPLICATIONS: None immediate. PROCEDURE: Informed written consent was obtained from the patient after a discussion of the risks, benefits and alternatives to treatment. A timeout was performed prior to the initiation of the procedure. Initial ultrasound scanning demonstrates a large amount of ascites within the left lateral abdomen. The left lateral abdomen was prepped and draped in the usual sterile fashion. 2% lidocaine was used for local anesthesia. Following this, a 19 gauge, 10-cm, Yueh catheter was introduced. An ultrasound image was saved for documentation purposes. The paracentesis was performed. The catheter was removed and a dressing was applied. The patient tolerated the procedure well without immediate post procedural complication. FINDINGS: A total of approximately 7.4 liters of clear, yellow fluid was removed. Samples were sent to the laboratory as requested by the clinical team. IMPRESSION: Successful ultrasound-guided diagnostic and therapeutic  paracentesis yielding 7.4 liters of peritoneal fluid. Read by: Loyce DysKacie Matthews PA-C Electronically Signed   By: Corlis Leak  Hassell M.D.   On: 11/22/2017 14:07   Ir Paracentesis  Result Date: 11/08/2017 INDICATION: Alcoholic cirrhosis with recurrent ascites. Request for diagnostic and therapeutic paracentesis. EXAM: ULTRASOUND GUIDED PARACENTESIS MEDICATIONS: 2% lidocaine = 10 mL COMPLICATIONS: None immediate. PROCEDURE: Informed written consent was obtained from the patient after a discussion of the risks, benefits and alternatives to treatment. A timeout was performed prior to the initiation of the procedure. Initial ultrasound scanning demonstrates a large amount of ascites within the left lower abdominal quadrant. The left lower abdomen was prepped and draped in the usual sterile fashion. 1% lidocaine with epinephrine was used for local anesthesia. Following this, a 19 gauge, 7-cm, Yueh catheter was introduced. An ultrasound image was saved for documentation purposes. The paracentesis was performed. The catheter was removed and a dressing was applied. The patient tolerated the procedure well without immediate post procedural complication. FINDINGS: A total of approximately 9 liters of cloudy yellow fluid was removed. Samples were sent to the laboratory as requested by the clinical team. IMPRESSION: Successful ultrasound-guided paracentesis yielding 9 liters of peritoneal fluid. Read by: Corrin ParkerWendy Blair, PA-C Electronically Signed   By: Judie PetitM.  Shick M.D.   On: 11/08/2017 10:42       Today   Subjective:   Katrina EeJennifer Cohen patient more short of breath this morning Objective:   Blood pressure (!) 83/44, pulse 80, temperature 98.4 F (36.9 C), temperature source Oral, resp. rate 18, height 5\' 4"  (1.626 m), weight 82.6 kg (182 lb), last menstrual period 01/29/2013, SpO2 91 %.  .  Intake/Output Summary (Last 24 hours) at 12/04/2017 1327 Last data filed at 12/03/2017 2105 Gross per 24 hour  Intake 708 ml  Output 100 ml   Net 608 ml    Exam VITAL SIGNS: Blood pressure (!) 83/44, pulse 80, temperature 98.4 F (36.9 C),  temperature source Oral, resp. rate 18, height 5\' 4"  (1.626 m), weight 82.6 kg (182 lb), last menstrual period 01/29/2013, SpO2 91 %.  GENERAL:  52 y.o.-year-old patient lying in the bed with no acute distress.  EYES: Pupils equal, round, reactive to light and accommodation. No scleral icterus. Extraocular muscles intact.  HEENT: Head atraumatic, normocephalic. Oropharynx and nasopharynx clear.  NECK:  Supple, no jugular venous distention. No thyroid enlargement, no tenderness.  LUNGS: Normal breath sounds bilaterally, no wheezing, rales,rhonchi or crepitation. No use of accessory muscles of respiration.  CARDIOVASCULAR: S1, S2 normal. No murmurs, rubs, or gallops.  ABDOMEN: Abdomen is distended with ascites EXTREMITIES: 2+ pedal edema, cyanosis, or clubbing.  NEUROLOGIC: Cranial nerves II through XII are intact. Muscle strength 5/5 in all extremities. Sensation intact. Gait not checked.  PSYCHIATRIC: The patient is alert and oriented x 3.  SKIN: No obvious rash, lesion, or ulcer.   Data Review     CBC w Diff:  Lab Results  Component Value Date   WBC 6.7 12/04/2017   HGB 7.7 (L) 12/04/2017   HCT 22.3 (L) 12/04/2017   PLT 44 (L) 12/04/2017   LYMPHOPCT 8 12/03/2017   MONOPCT 14 12/03/2017   EOSPCT 1 12/03/2017   BASOPCT 0 12/03/2017   CMP:  Lab Results  Component Value Date   NA 124 (L) 12/04/2017   K 5.0 12/04/2017   CL 93 (L) 12/04/2017   CO2 21 (L) 12/04/2017   BUN 67 (H) 12/04/2017   CREATININE 2.97 (H) 12/04/2017   PROT 6.4 (L) 11/30/2017   ALBUMIN 3.1 (L) 11/30/2017   BILITOT 4.4 (H) 11/30/2017   ALKPHOS 170 (H) 11/30/2017   AST 68 (H) 11/30/2017   ALT 33 11/30/2017  .  Micro Results Recent Results (from the past 240 hour(s))  Body fluid culture     Status: None   Collection Time: 11/25/17 11:24 AM  Result Value Ref Range Status   Specimen Description FLUID  PERITONEAL  Final   Special Requests NONE  Final   Gram Stain   Final    FEW WBC PRESENT,BOTH PMN AND MONONUCLEAR NO ORGANISMS SEEN    Culture   Final    NO GROWTH 3 DAYS Performed at [facility], [address], [place], [place] [address]    Report Status 11/28/2017 FINAL  Final  Body fluid culture     Status: None (Preliminary result)   Collection Time: 12/02/17 12:15 PM  Result Value Ref Range Status   Specimen Description   Final    PERITONEAL Performed at [facility], [address], [place], [place] [address]    Special Requests   Final    NONE Performed at [facility], [address] [place], [place] [address]    Gram Stain NO WBC SEEN NO ORGANISMS SEEN   Final   Culture   Final    NO GROWTH 2 DAYS Performed at [facility], [address], [place], [place] [address]    Report Status PENDING  Incomplete        Code Status Orders  (From admission, onward)        Start     Ordered   11/30/17 1924  Full code  Continuous     11/30/17 1923    Code Status History    Date Active Date Inactive Code Status Order ID Comments User Context   11/13/2017 0225 11/14/2017 1608 Full Code [id number]  Barbaraann Rondo, MD Inpatient  10/27/2017 0820 10/28/2017 1555 Full Code [id number]  Barbaraann Rondo, MD ED   01/18/2017 2156 01/21/2017 1735 Full Code [id number]  Eduard Clos, MD ED   04/02/2016 0506 04/03/2016 1524 Full Code [id number]  Briscoe Deutscher, MD ED   11/12/2015 1753 11/13/2015 1502 Full Code [id number]  Standley Brooking, MD ED   11/07/2015 0637 11/08/2015 1423 Full Code [id number]  Michael Litter, MD Inpatient   04/28/2015 2111 04/30/2015 2323 Full Code [id number]  Eston Esters, MD ED   06/07/2011 1321 06/08/2011 2157 Full Code [id number]  Dione Booze, MD ED   05/27/2011 0912 05/27/2011 1811 Full Code [id number]  Remi Haggard, NP ED            Discharge Medications   Allergies as of 12/04/2017      Reactions   Tizanidine  Other (See Comments)   Dizzy,falls      Medication List    STOP taking these medications   furosemide 40 MG tablet Commonly known as:  LASIX   pioglitazone 30 MG tablet Commonly known as:  ACTOS   spironolactone 50 MG tablet Commonly known as:  ALDACTONE     TAKE these medications   albuterol (2.5 MG/3ML) 0.083% nebulizer solution Commonly known as:  PROVENTIL Take 3 mLs (2.5 mg total) by nebulization every 4 (four) hours as needed for wheezing or shortness of breath.   PROAIR HFA 108 (90 Base) MCG/ACT inhaler Generic drug:  albuterol INHALE 2 PUFFS INTO THE LUNGS EVERY 6 HOURS AS NEEDED FOR SHORTNESS OF BREATH   ARIPiprazole 5 MG tablet Commonly known as:  ABILIFY Take 5 mg by mouth at bedtime.   ciprofloxacin 500 MG tablet Commonly known as:  CIPRO Take 500 mg by mouth daily.   citalopram 20 MG tablet Commonly known as:  CELEXA TAKE 1 TABLET BY MOUTH EVERY NIGHT   lactulose 10 GM/15ML solution Commonly known as:  CHRONULAC Take 30 ml by mouth twice daily.   midodrine 10 MG tablet Commonly known as:  PROAMATINE Take 1 tablet (10 mg total) by mouth 3 (three) times daily with meals.   morphine 30 MG 12 hr tablet Commonly known as:  MS CONTIN Take 30 mg by mouth daily.   octreotide 100 MCG/ML Soln injection Commonly known as:  SANDOSTATIN Inject 1 mL (100 mcg total) into the skin 3 (three) times daily.   ondansetron 4 MG disintegrating tablet Commonly known as:  ZOFRAN ODT Take 1 tablet (4 mg total) by mouth every 8 (eight) hours as needed for nausea or vomiting.   traZODone 50 MG tablet Commonly known as:  DESYREL Take 50 mg by mouth at bedtime.   XIFAXAN 550 MG Tabs tablet Generic drug:  rifaximin Take 1 tablet by mouth 2 (two) times daily.          Total Time in preparing paper work, data evaluation and todays exam - 35 minutes  Auburn Bilberry M.D on 12/04/2017 at 1:27 PM [employer]   Office  [phone]

## 2017-12-04 NOTE — Progress Notes (Signed)
Pt. is going to room 8212 on General Med floor at [facility] at [address] [place] [id number]. Floor contact number is [phone]. Dr. Carilyn GoodpastureYeatts is the accepting MD and Dr. Auburn BilberryShreyang Patel is the referring MD.  [facility] to [facility] RN name Dalton Crump,RN . [facility] was called and they said they won't get here to pick up pt. until 10 pm-12 :00 am . So, [facility] was also called for transport. They also said that it will be after shift change. At this time, pt is waiting for transport that will come first. Once either one has pick up patient, I will cancel the other requested transportation.

## 2017-12-04 NOTE — Significant Event (Signed)
Rapid Response Event Note  Overview:   called to patient room for decreased oxygen saturation, down as low as 85%    Initial Focused Assessment:  Patient observed sitting on side of bed, increased RR, crackles noted bilaterally.    Interventions: Patient placed on 100% NRB, with improved saturation. Up to 96%  Plan of Care (if not transferred): [facility], with bed assigned currently awaiting transport. (ETA approx 30-40 min out) Patient appears comfortable, POC to keep patient on floor on BIPAP until transport arrives. If patient declines will be transferred to ICU.  Will continue to monitor for changes/need.  Event Summary:   at      at          [facility],Saverio Kader Y

## 2017-12-04 NOTE — Progress Notes (Signed)
[facility]  ROUNDING NOTE   Subjective:  Serum sodium currently lower at 124. Patient developed significant shortness of breath overnight. Lasix was administered. Serum sodium now down to 124 with a BUN of 67 and creatinine of 2.97. Foley catheter placed but no significant urine output received.   Objective:  Vital signs in last 24 hours:  Temp:  [97.9 F (36.6 C)-98.4 F (36.9 C)] 98.4 F (36.9 C) (07/14 0456) Pulse Rate:  [73-91] 87 (07/14 0546) Resp:  [17-28] 28 (07/14 0503) BP: (88-97)/(42-50) 95/42 (07/14 0505) SpO2:  [75 %-99 %] 93 % (07/14 0546)  Weight change:  Filed Weights   11/30/17 1545  Weight: 82.6 kg (182 lb)    Intake/Output: I/O last 3 completed shifts: In: 1805 [P.O.:118; I.V.:1587; IV Piggyback:100] Out: 251 [Urine:250; Stool:1]   Intake/Output this shift:  No intake/output data recorded.  Physical Exam: General: No acute distress  Head: Normocephalic, atraumatic. Moist oral mucosal membranes  Eyes: Mild icterus  Neck: Supple, trachea midline  Lungs:  Clear to auscultation, normal effort  Heart: S1S2 no rubs  Abdomen:  Soft, nontender, bowel sounds present, marked distention  Extremities: 3+ peripheral edema.  Neurologic: Awake, alert, following commands  Skin: No lesions       Basic Metabolic Panel: Recent Labs  Lab 11/30/17 1549 12/01/17 0449 12/02/17 0530 12/03/17 0609 12/04/17 1001  NA 117* 119* 122* 126* 124*  K 4.6 4.6 4.4 4.6 5.0  CL 82* 86* 90* 93* 93*  CO2 26 26 21* 23 21*  GLUCOSE 203* 138* 131* 126* 131*  BUN 44* 47* 50* 55* 67*  CREATININE 1.80* 1.89* 1.76* 1.89* 2.97*  CALCIUM 9.3 8.9 8.9 8.8* 8.8*    Liver Function Tests: Recent Labs  Lab 11/29/17 2043 11/30/17 1549  AST 56* 68*  ALT 31 33  ALKPHOS 169* 170*  BILITOT 3.8* 4.4*  PROT 5.6* 6.4*  ALBUMIN 2.7* 3.1*   No results for input(s): LIPASE, AMYLASE in the last 168 hours. Recent Labs  Lab 11/29/17 2043 11/30/17 1703  AMMONIA 16 <9*     CBC: Recent Labs  Lab 11/29/17 2043 11/30/17 1549 12/01/17 0449 12/02/17 0530 12/03/17 0609 12/04/17 1015  WBC 5.0 5.3 5.7 4.8 4.3 6.7  NEUTROABS 3.7  --   --   --  3.3  --   HGB 9.7* 10.9* 10.3* 8.7* 7.8* 7.7*  HCT 27.6* 31.5* 29.6* 24.8* 21.6* 22.3*  MCV 86.3 89.8 89.4 90.0 89.7 92.0  PLT 69* 80* 82* 47* 42* 44*    Cardiac Enzymes: No results for input(s): CKTOTAL, CKMB, CKMBINDEX, TROPONINI in the last 168 hours.  BNP: Invalid input(s): POCBNP  CBG: Recent Labs  Lab 12/03/17 1150 12/03/17 1651 12/03/17 2137 12/04/17 0746 12/04/17 1157  GLUCAP 154* 129* 168* 126* 143*    Microbiology: Results for orders placed or performed during the hospital encounter of 11/30/17  Body fluid culture     Status: None (Preliminary result)   Collection Time: 12/02/17 12:15 PM  Result Value Ref Range Status   Specimen Description   Final    PERITONEAL Performed at [facility], [address] [place], [place] [address]    Special Requests   Final    NONE Performed at [facility], [address], [place], [place] [address]    Gram Stain NO WBC SEEN NO ORGANISMS SEEN   Final   Culture   Final    NO GROWTH 2 DAYS Performed at [facility], [address]  [place], [place] [id number]    Report Status PENDING  Incomplete    Coagulation Studies: No results for input(s): LABPROT, INR in the last 72 hours.  Urinalysis: No results for input(s): COLORURINE, LABSPEC, PHURINE, GLUCOSEU, HGBUR, BILIRUBINUR, KETONESUR, PROTEINUR, UROBILINOGEN, NITRITE, LEUKOCYTESUR in the last 72 hours.  Invalid input(s): APPERANCEUR    Imaging: No results found.   Medications:   . albumin human 25 g (12/04/17 0424)   . ARIPiprazole  5 mg Oral QHS  . ciprofloxacin  500 mg Oral Daily  . citalopram  20 mg Oral QHS  . furosemide  40 mg Intravenous Q12H  . insulin aspart  0-5 Units Subcutaneous QHS  . insulin aspart  0-9 Units Subcutaneous TID WC  .  lactulose  30 g Oral BID  . midodrine  10 mg Oral TID WC  . morphine  30 mg Oral Daily  . octreotide  100 mcg Subcutaneous TID  . pioglitazone  30 mg Oral Daily  . rifaximin  550 mg Oral BID  . traZODone  50 mg Oral QHS   albuterol, ondansetron **OR** ondansetron (ZOFRAN) IV, traMADol  Assessment/ Plan:  52 y.o. female with a PMHx of bipolar disorder, cirrhosis of the liver, depression, diabetes mellitus, hepatitis, hypertension, mononucleosis, who was admitted to [facility] on 11/30/2017 for evaluation of weakness.  Upon presentation she was found to have a very low sodium of 117.  1.  Acute renal failure, likely due to hepatorenal syndrome, Urine Na < 10 despite diuretics being d/c'd several days ago, IVF hydration, albumin, and blood pressure support.  2.  Severe hyponatremia.  3.  Cirrhosis of the liver.  4.  Lower extremity edema.  5.  Recurrent ascites.   Plan:  Patient status worsening.  We are highly suspicious for hepatorenal syndrome.  Urine sodium was checked yesterday and was less than 10 which is concerning.  This is despite diuretics being discontinued several days ago.  In the interim she also received IV fluid hydration, albumin, and blood pressure support with midodrine.  Therefore we recommend transfer to a tertiary care center where transplantation could be a potential option.  IV fluids have been discontinued as she developed signs of volume overload.  Patient was given Lasix early this a.m.  If renal function continues to deteriorate renal replacement therapy may need to be considered.  Guarded prognosis at the patient has a very high meld score.  Further plan as per hospitalist.  Octreotide to be started.    LOS: 4 Kentavious Michele 7/14/201912:22 PM

## 2017-12-04 NOTE — Progress Notes (Signed)
Called Dr. Sheryle Hailiamond regarding shortness of breath and crackling in the bases of her lungs.  Instructed to lower maintenance fluids to 50 mL/hr.  Patient is sating at 92% on 5L of oxygen.  Will continue to monitor patient.  Arturo MortonClay, Jaylie Neaves N  12/04/2017  5:20 AM

## 2017-12-04 NOTE — Progress Notes (Signed)
Scan bladder and scanner showed 971 mL. Foley cath inserted, urine returned in the amount of about 10 mL. I flushed the catheter to clear the tube from sediments, but still no urine return equivalent to bladder scan. MD was notified .

## 2017-12-04 NOTE — Consult Note (Signed)
Pharmacy consulted for octreotide dosing for hepatorenal syndrome (spoke with MD on 0714 @ 1130). Dosed at 100 mcg SQ three times daily for this indication.    Mauri ReadingSavanna M Khaleef Ruby, PharmD Pharmacy Resident  12/04/2017 11:43 AM

## 2017-12-05 LAB — CYTOLOGY - NON PAP

## 2017-12-06 LAB — BODY FLUID CULTURE
Culture: NO GROWTH
GRAM STAIN: NONE SEEN

## 2017-12-20 ENCOUNTER — Telehealth: Payer: Self-pay

## 2017-12-20 NOTE — Telephone Encounter (Signed)
Phone call placed to patient's home. Spoke with family who informed this RN that patient had passed about 2 weeks ago. Condolences offered

## 2017-12-22 DEATH — deceased

## 2017-12-28 ENCOUNTER — Ambulatory Visit: Admit: 2017-12-28 | Payer: Medicaid Other | Admitting: Internal Medicine

## 2017-12-28 SURGERY — ESOPHAGOGASTRODUODENOSCOPY (EGD) WITH PROPOFOL
Anesthesia: General

## 2018-12-18 IMAGING — US IR PARACENTESIS
1 series · 1 of 1 positions shown · non-contrast
Comparison: none

INDICATION: Patient with history of alcoholic cirrhosis with recurrent ascites.
Request is made for diagnostic and therapeutic paracentesis.

[Series 1: ir (id) (id)/(id)/(id) ir · 1 of 1 slices shown]
[im 1/1]
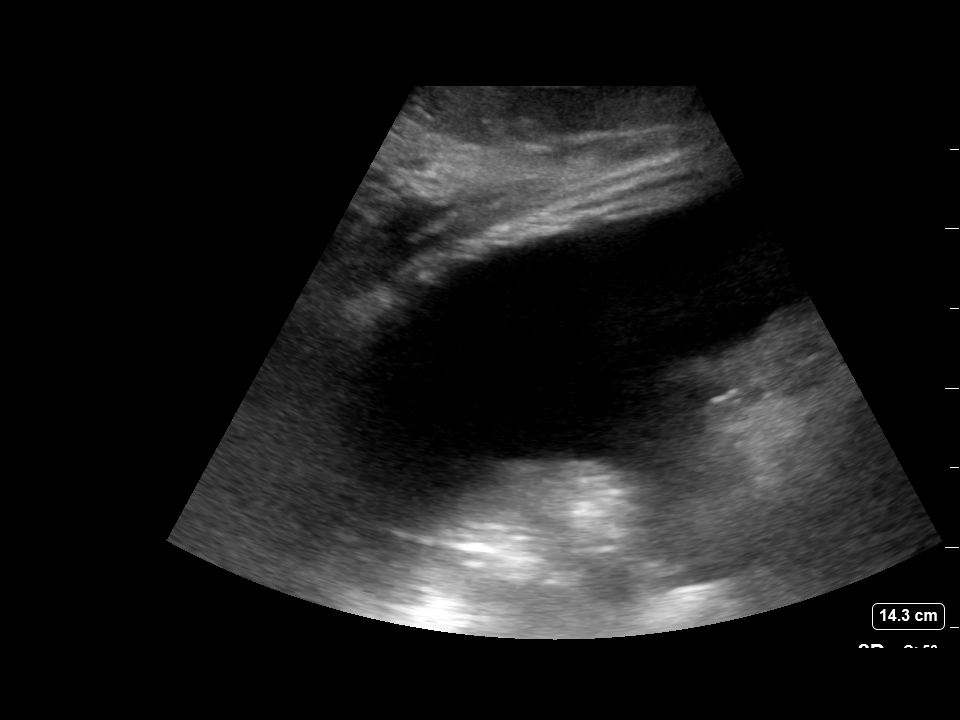

[1 of 1 positions shown; findings below may reference images not displayed]

EXAM:
ULTRASOUND GUIDED DIAGNOSTIC AND THERAPEUTIC PARACENTESIS

MEDICATIONS:
10 milliliters of 2% lidocaine

COMPLICATIONS:
None immediate.

PROCEDURE:
Informed written consent was obtained from the patient after a
discussion of the risks, benefits and alternatives to treatment. A
timeout was performed prior to the initiation of the procedure.

Initial ultrasound scanning demonstrates a large amount of ascites
within the left lower abdominal quadrant. The left lower abdomen was
prepped and draped in the usual sterile fashion. 2% lidocaine was
used for local anesthesia.

Following this, a 19 gauge, 7-cm, Yueh catheter was introduced. An
ultrasound image was saved for documentation purposes. The
paracentesis was performed. The catheter was removed and a dressing
was applied. The patient tolerated the procedure well without
immediate post procedural complication.
FINDINGS: A total of approximately 7.3 liters of clear gold fluid was removed.
Samples were sent to the laboratory as requested by the clinical
team.
IMPRESSION: Successful ultrasound-guided paracentesis yielding 7.3 liters of
peritoneal fluid.

## 2018-12-30 IMAGING — US US PARACENTESIS
1 series · 9 of 9 positions shown · non-contrast
Comparison: none

INDICATION: Cirrhosis, recurrent ascites, abdominal distension

[Series 1: us paracentesis · 9 of 9 slices shown]
[im 1/9]
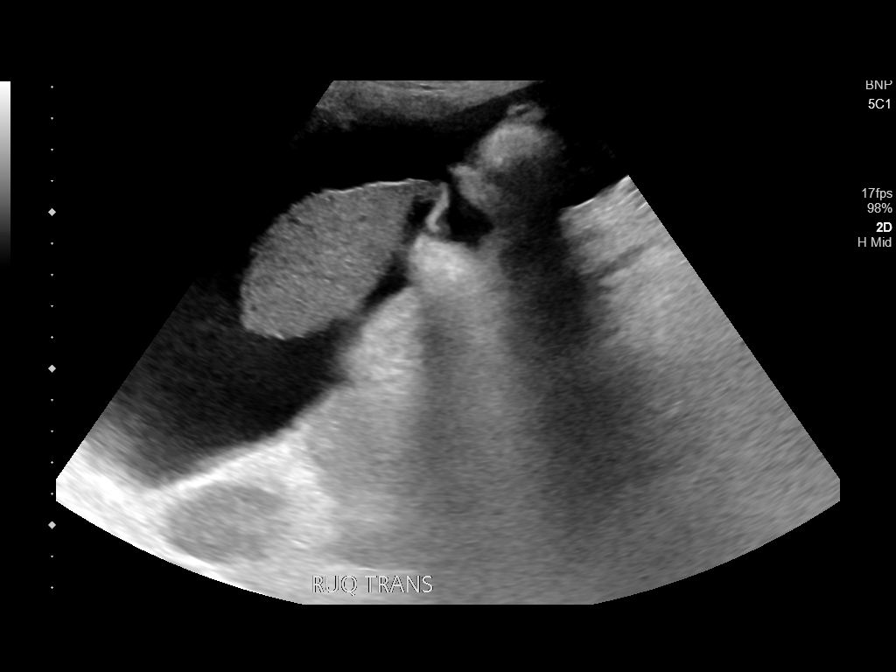
[im 2/9]
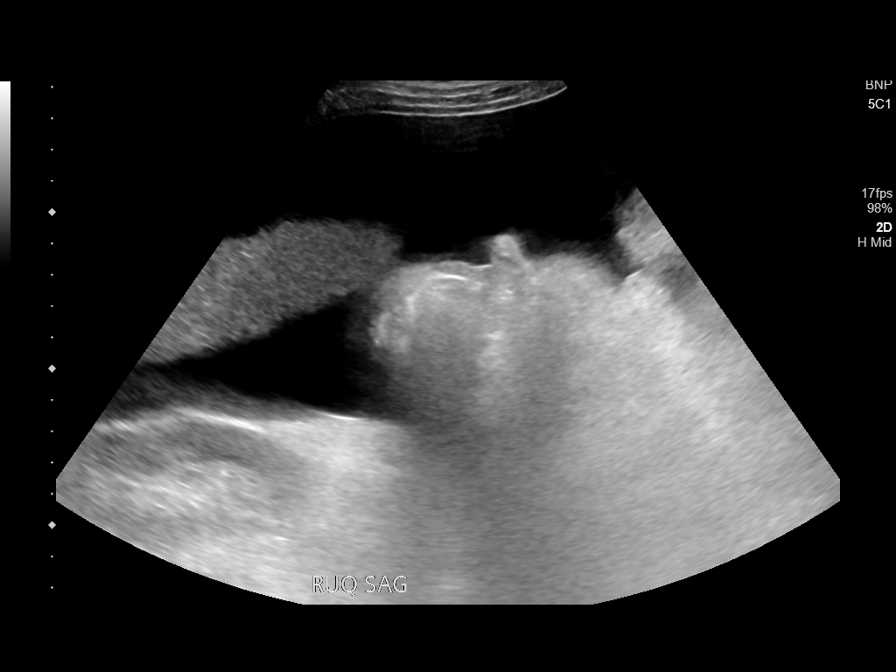
[im 3/9]
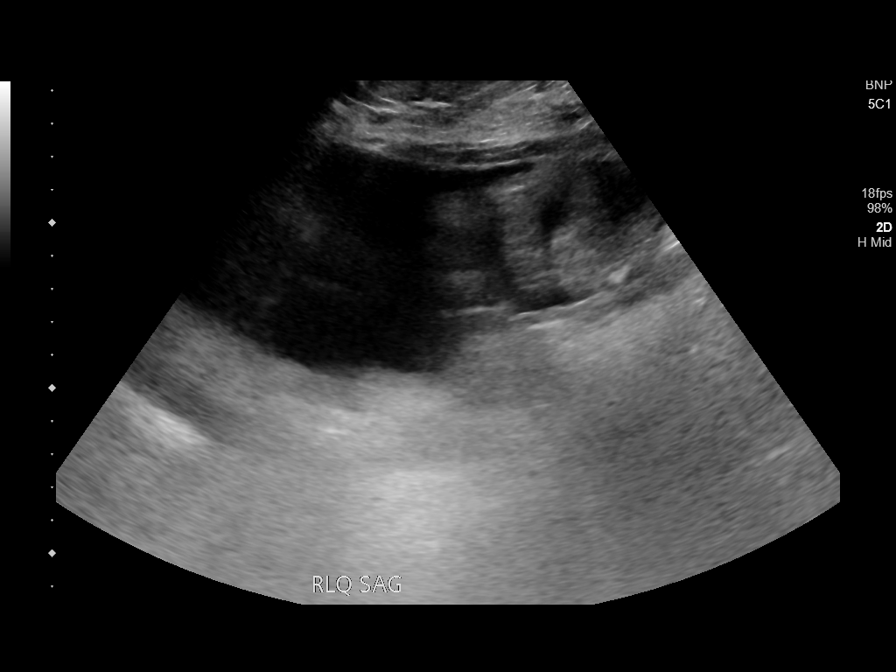
[im 4/9]
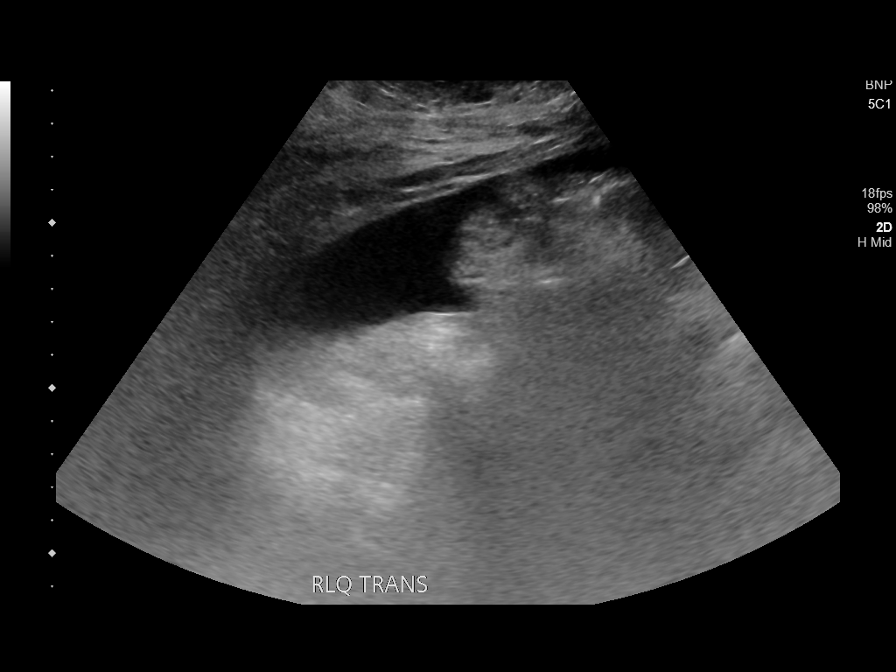
[im 5/9]
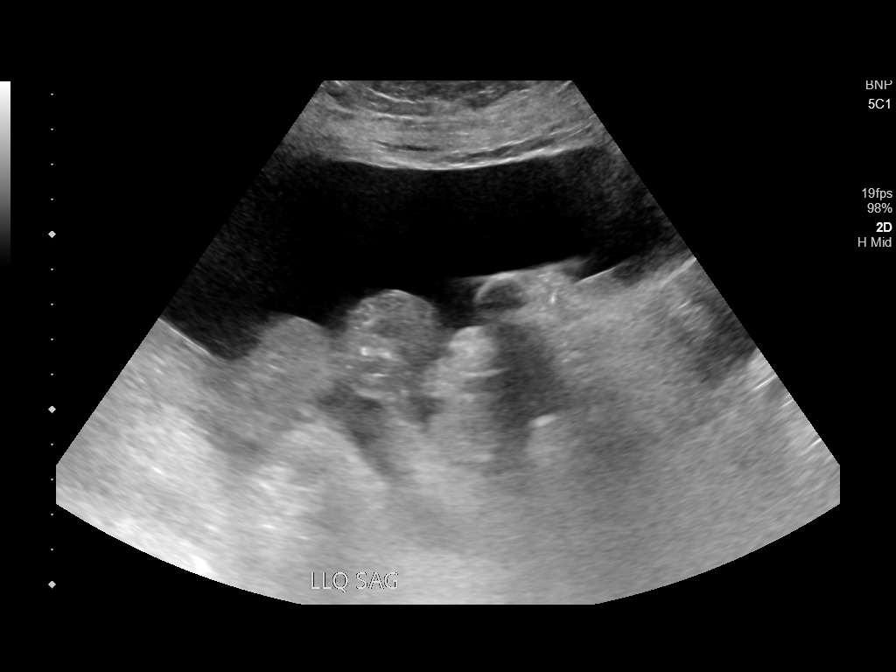
[im 6/9]
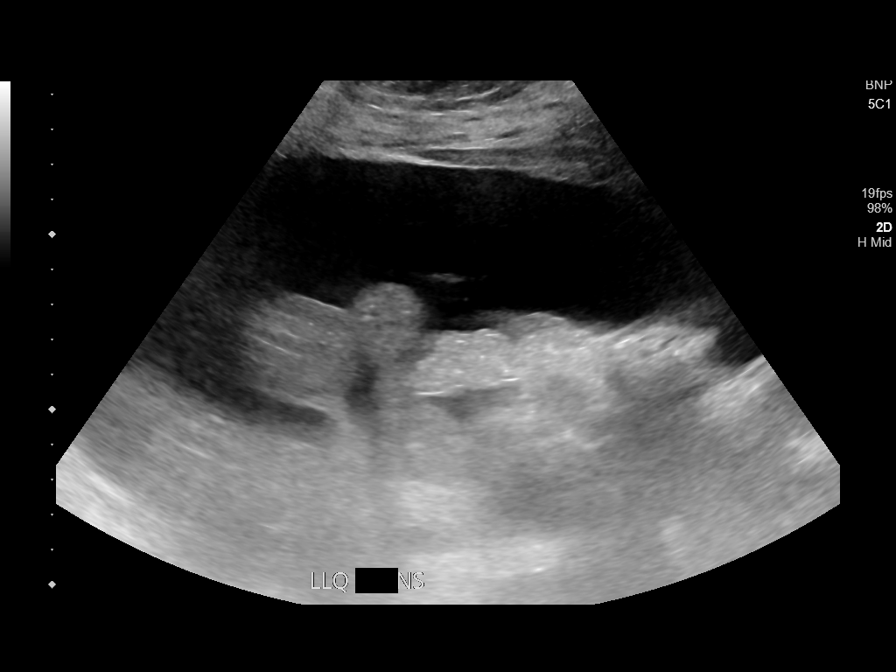
[im 7/9]
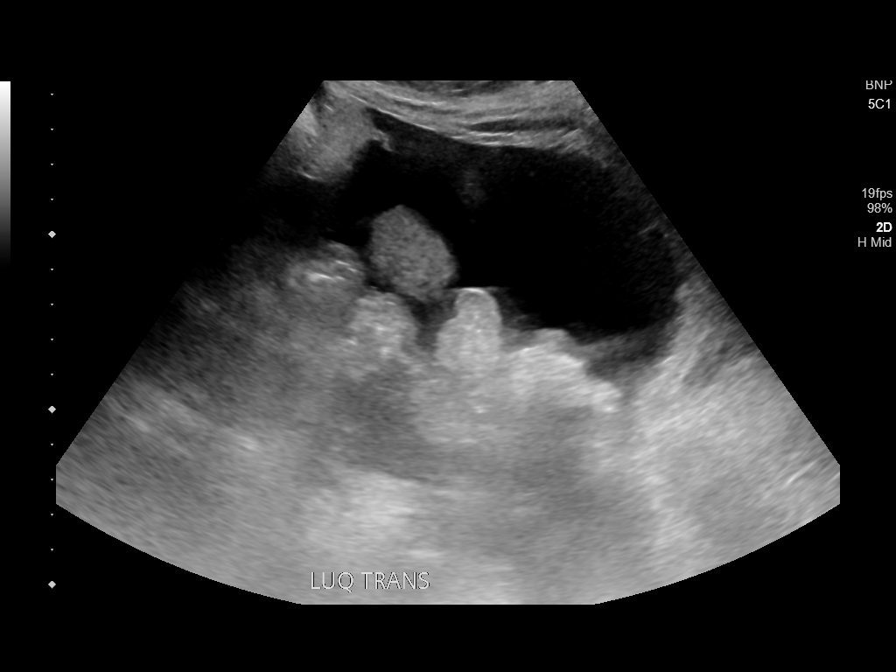
[im 8/9]
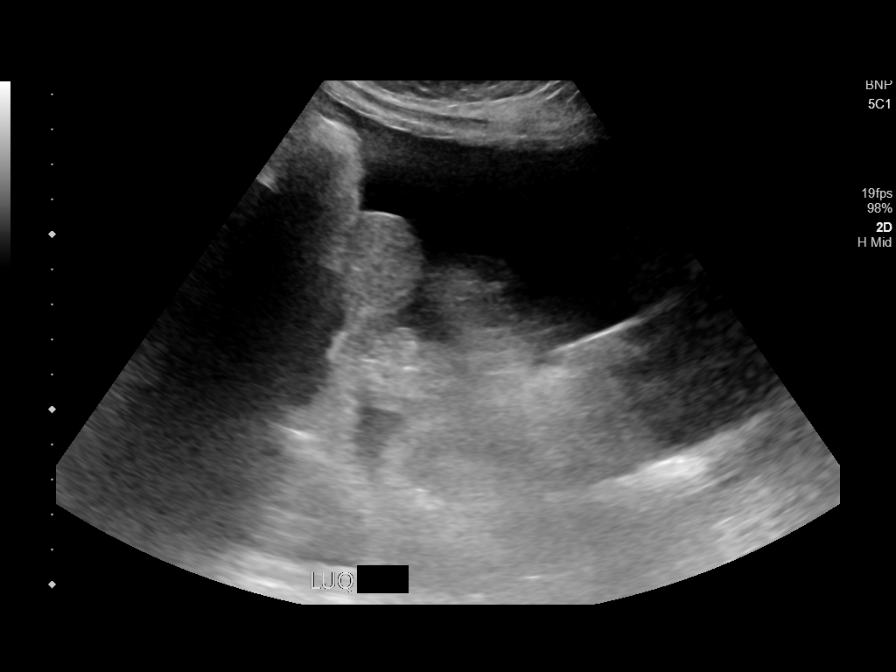
[im 9/9]
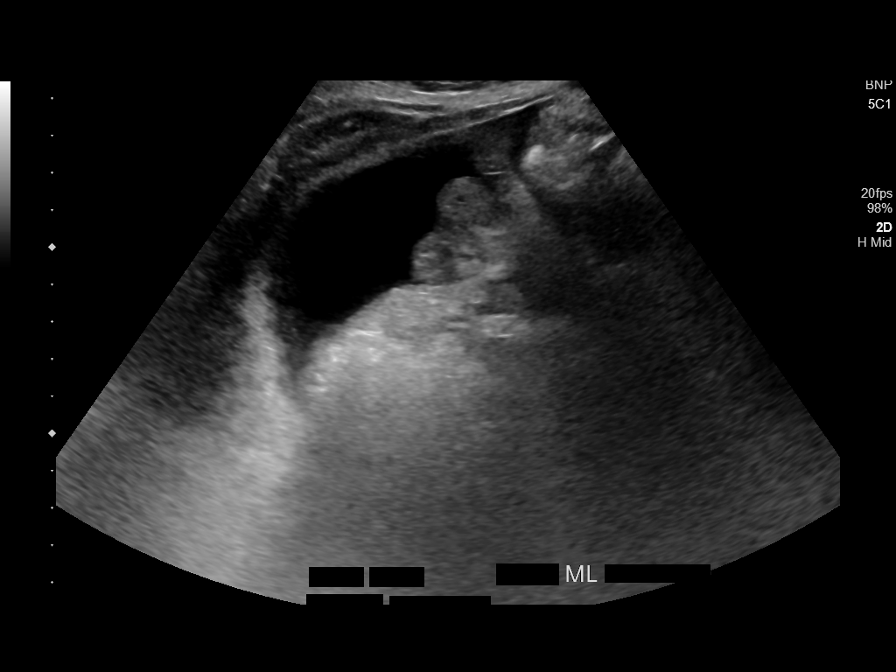

[9 of 9 positions shown; findings below may reference images not displayed]

EXAM:
ULTRASOUND GUIDED RIGHT PARACENTESIS

MEDICATIONS:
1% LIDOCAINE LOCAL

COMPLICATIONS:
None immediate.

PROCEDURE:
Informed written consent was obtained from the patient after a
discussion of the risks, benefits and alternatives to treatment. A
timeout was performed prior to the initiation of the procedure.

Initial ultrasound scanning demonstrates a large amount of ascites
within the right lower abdominal quadrant. The right lower abdomen
was prepped and draped in the usual sterile fashion. 1% lidocaine
with epinephrine was used for local anesthesia.

Following this, a 6 Fr Safe-T-Centesis catheter was introduced. An
ultrasound image was saved for documentation purposes. The
paracentesis was performed. The catheter was removed and a dressing
was applied. The patient tolerated the procedure well without
immediate post procedural complication.
FINDINGS: A total of approximately 5.1 L of clear peritoneal fluid was
removed. Sample was not sent for laboratory analysis
IMPRESSION: Successful ultrasound-guided paracentesis yielding 5.1 L liters of
peritoneal fluid.

## 2019-01-13 IMAGING — US IR PARACENTESIS
1 series · 3 of 3 positions shown · non-contrast
Comparison: none

INDICATION: Patient with recurrent ascites. Request is made for diagnostic and
therapeutic paracentesis. Patient to receive albumin postprocedure.

[Series 1: ir (id) (id)/(id)/(id) ir · 3 of 3 slices shown]
[im 1/3]
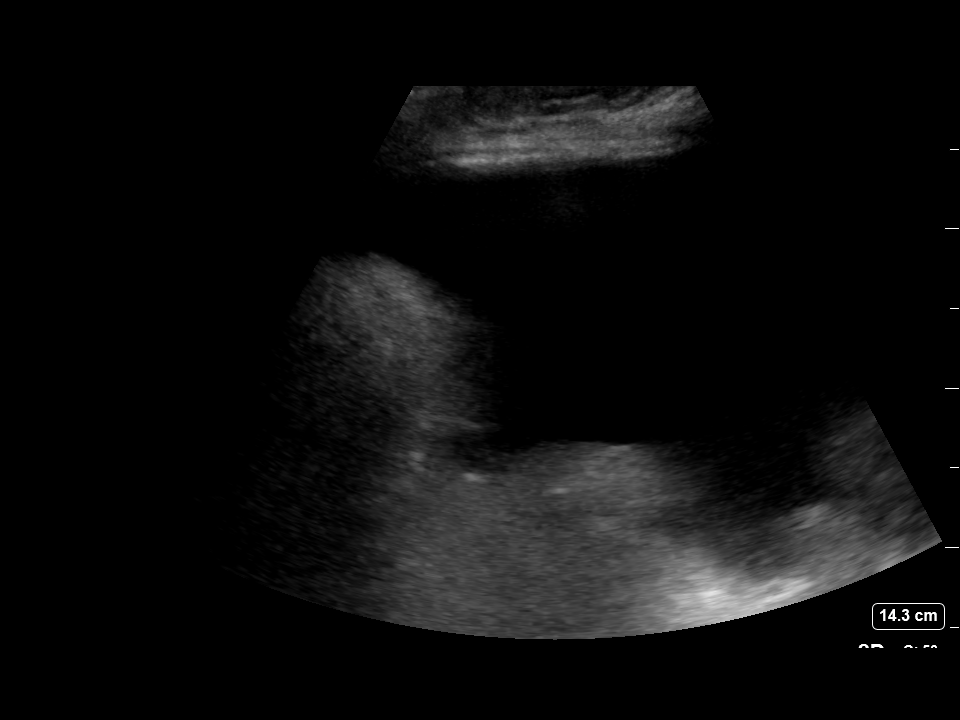
[im 2/3]
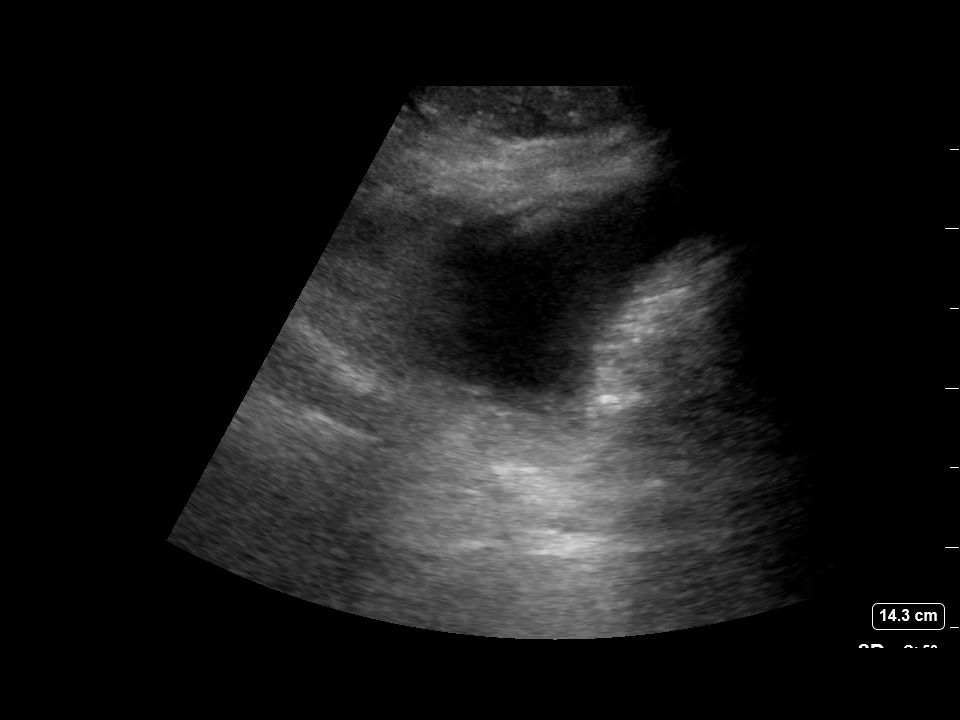
[im 3/3]
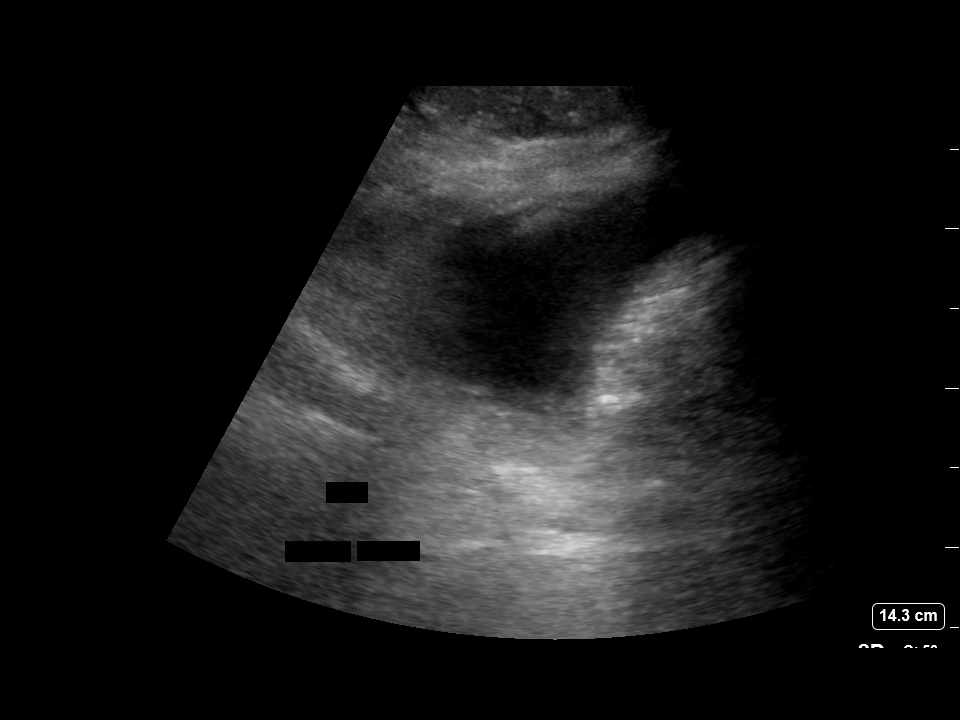

[3 of 3 positions shown; findings below may reference images not displayed]

EXAM:
ULTRASOUND GUIDED DIAGNOSTIC AND THERAPEUTIC PARACENTESIS

MEDICATIONS:
10 mL 2% lidocaine

COMPLICATIONS:
None immediate.

PROCEDURE:
Informed written consent was obtained from the patient after a
discussion of the risks, benefits and alternatives to treatment. A
timeout was performed prior to the initiation of the procedure.

Initial ultrasound scanning demonstrates a large amount of ascites
within the right lower abdominal quadrant. The right lower abdomen
was prepped and draped in the usual sterile fashion. 2% lidocaine
was used for local anesthesia.

Following this, a 19 gauge, 7-cm, Yueh catheter was introduced. An
ultrasound image was saved for documentation purposes. The
paracentesis was performed. The catheter was removed and a dressing
was applied. The patient tolerated the procedure well without
immediate post procedural complication.
FINDINGS: A total of approximately 5.8 liters of yellow fluid was removed.
Samples were sent to the laboratory as requested by the clinical
team.
IMPRESSION: Successful ultrasound-guided diagnostic and therapeutic paracentesis
yielding 5.8 liters of peritoneal fluid.

## 2019-01-16 IMAGING — US US RENAL
1 series · 14 of 25 positions shown · non-contrast
Comparison: Limited ultrasound the abdomen of 11/09/2017

CLINICAL DATA: Acute renal failure

EXAM:
RENAL / URINARY TRACT ULTRASOUND COMPLETE

[Series 1: us renal · 14 of 45 slices shown]
[im 1/45]
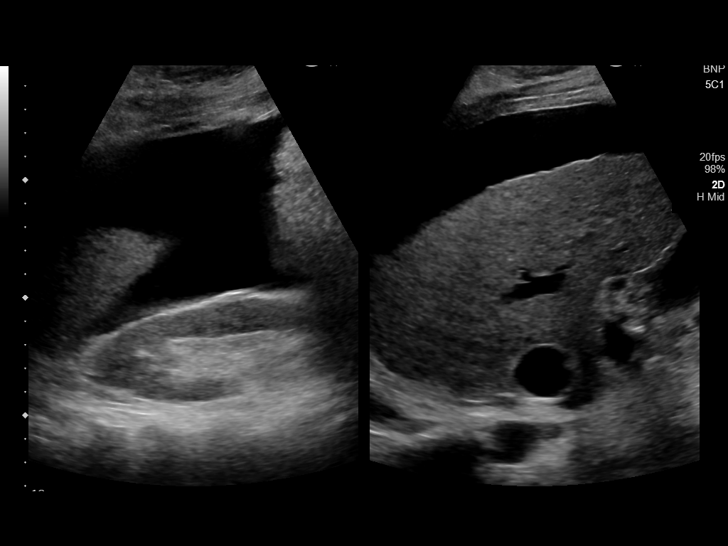
[im 4/45]
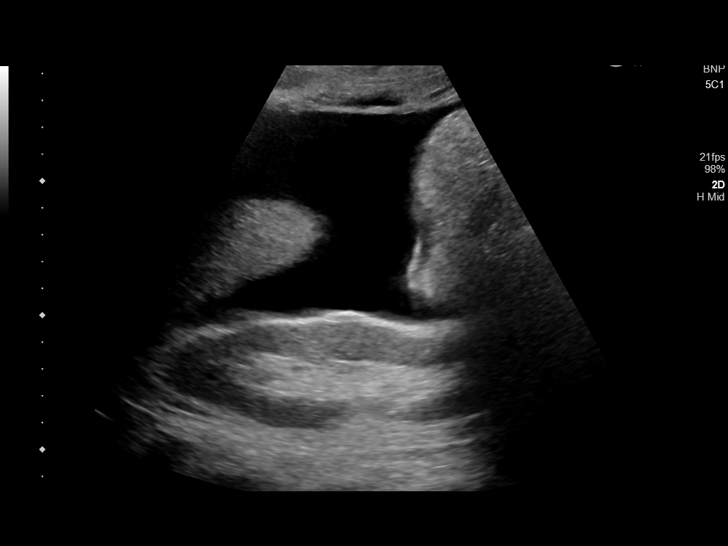
[im 8/45]
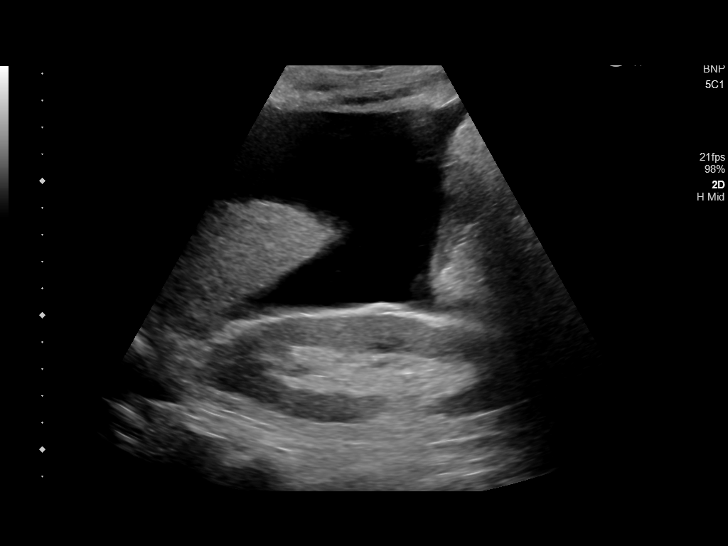
[im 12/45]
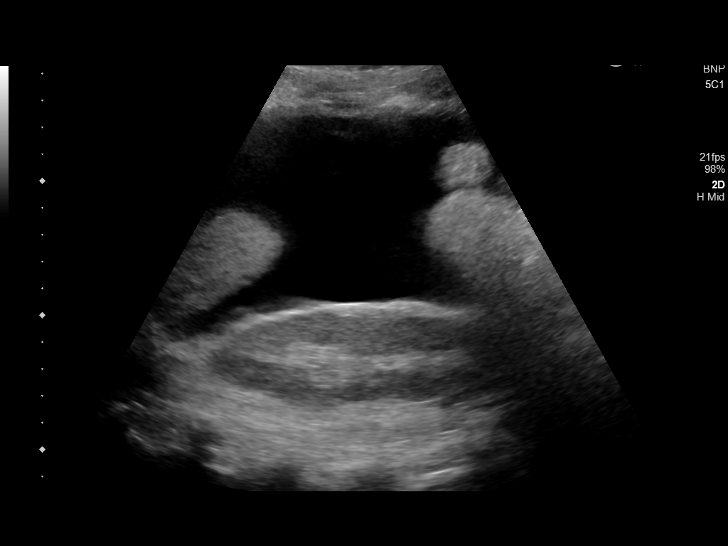
[im 15/45]
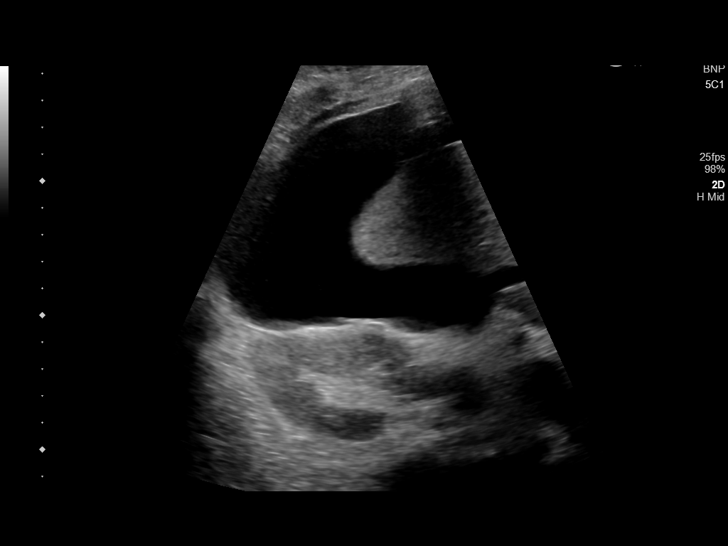
[im 17/45]
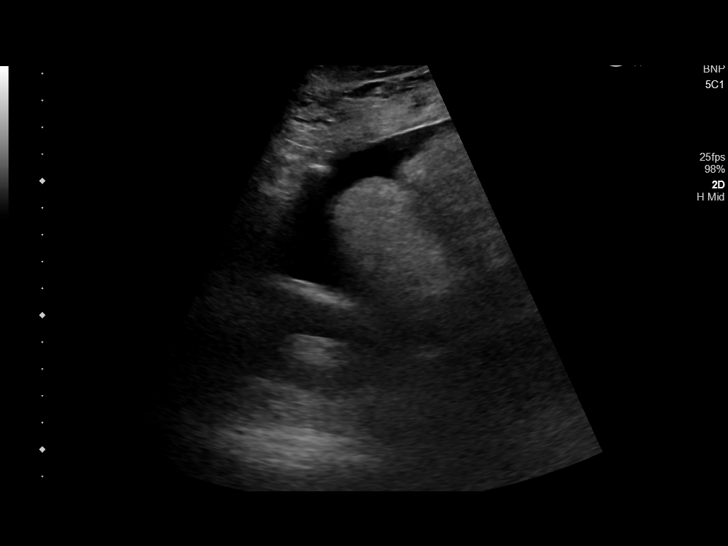
[im 21/45]
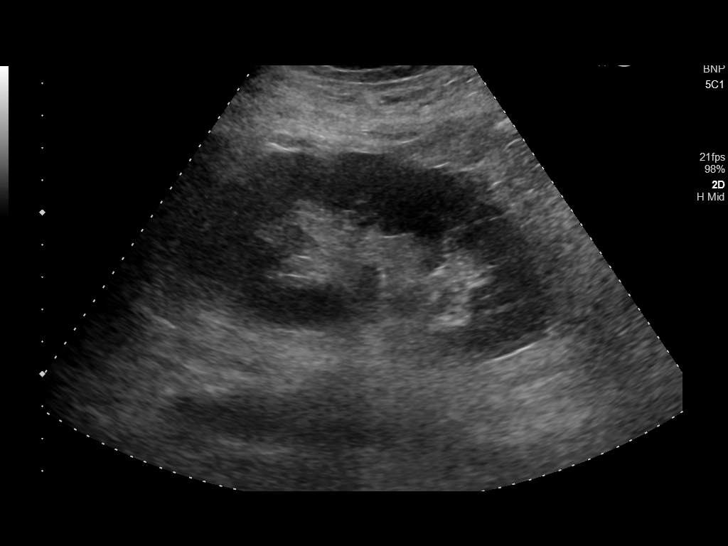
[im 24/45]
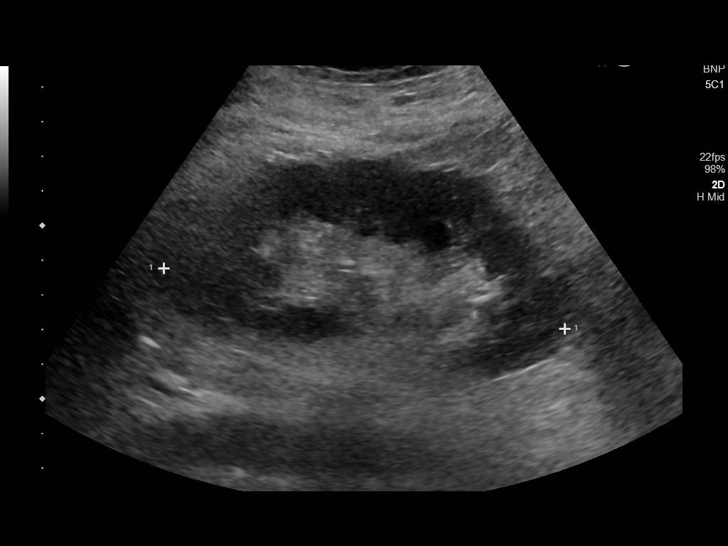
[im 28/45]
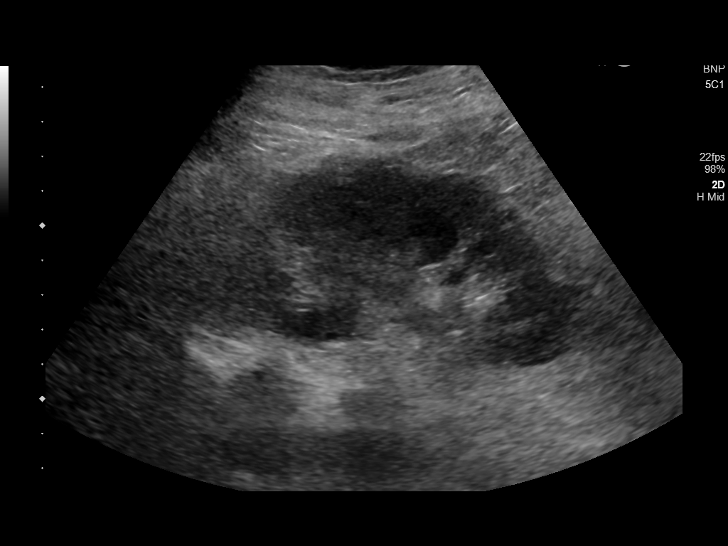
[im 30/45]
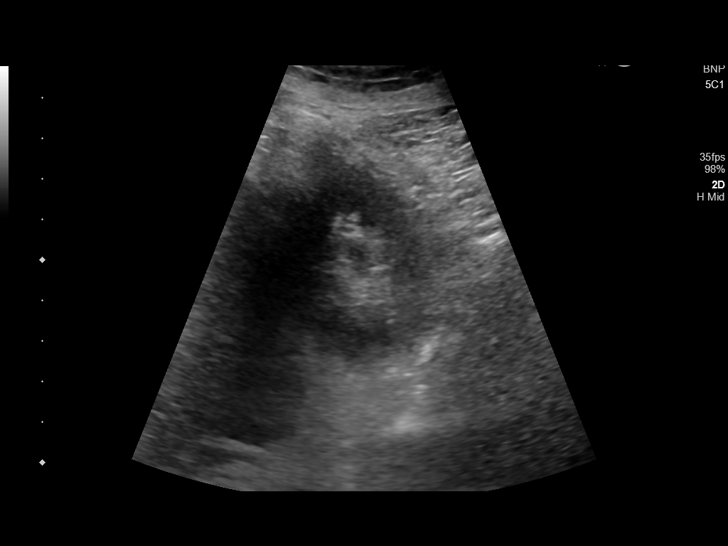
[im 34/45]
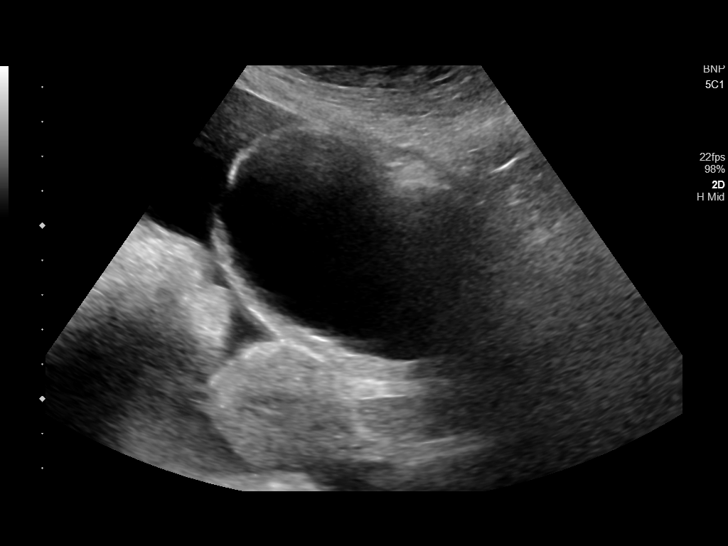
[im 37/45]
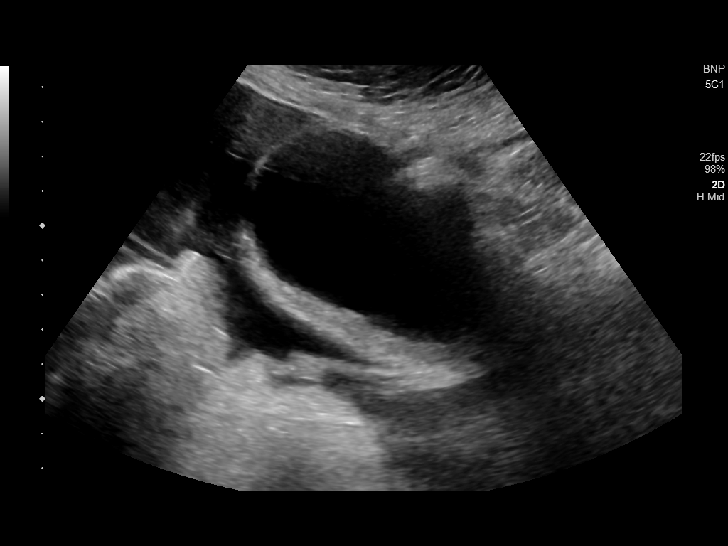
[im 41/45]
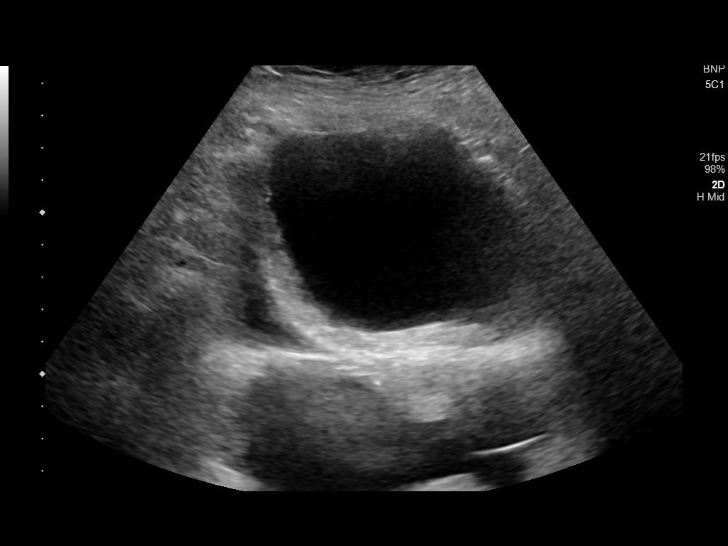
[im 45/45]
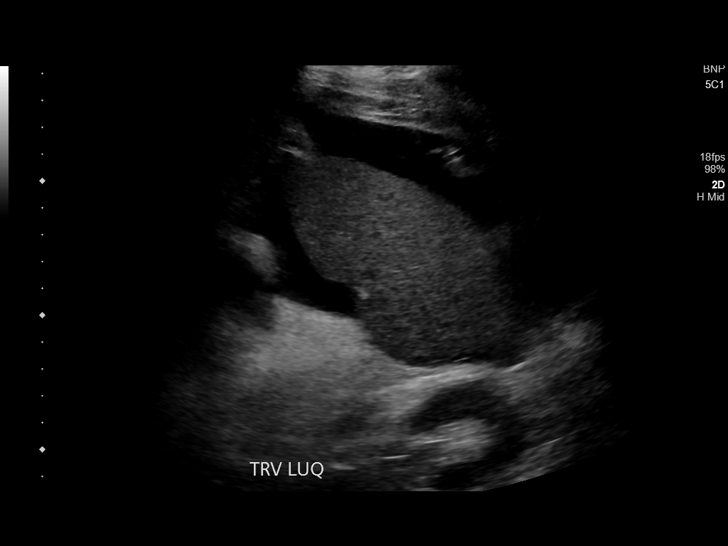

[14 of 25 positions shown; findings below may reference images not displayed]

FINDINGS: Right Kidney:

Length: 12.9 cm.. No hydronephrosis is seen. The parenchyma may be
slightly echogenic.

Left Kidney:

Length: 11.7 cm..  No hydronephrosis is seen.

There is a moderate amount of ascites present throughout all 4
quadrants

Bladder:

The urinary bladder is moderately well distended with no abnormality
noted.
IMPRESSION: 1. Moderate amount of ascites throughout the peritoneal cavity.
2. No hydronephrosis.

## 2020-02-21 IMAGING — US US ABDOMEN LIMITED
1 series · 5 of 5 positions shown · non-contrast
Comparison: 11/08/2017

CLINICAL DATA: Abdominal distension

EXAM:
LIMITED ABDOMEN ULTRASOUND FOR ASCITES
TECHNIQUE: Limited ultrasound survey for ascites was performed in all four
abdominal quadrants.

[Series 1: us abdomen limited · 0.26mm/px · 5 of 5 slices shown]
[im 1/5]
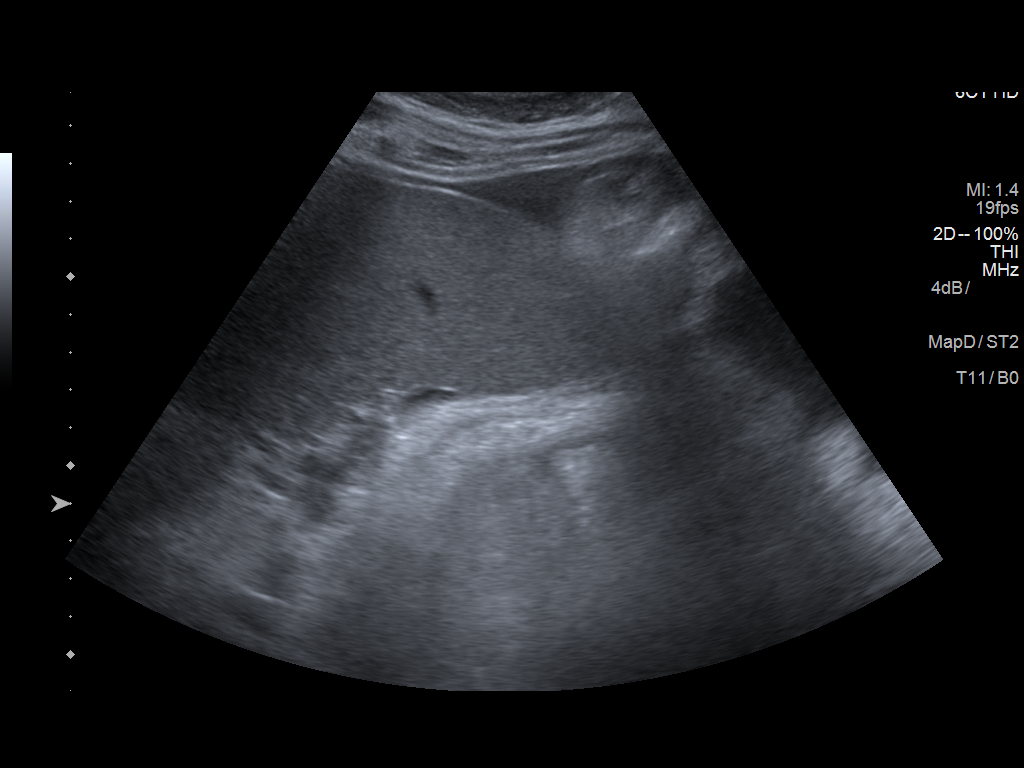
[im 2/5]
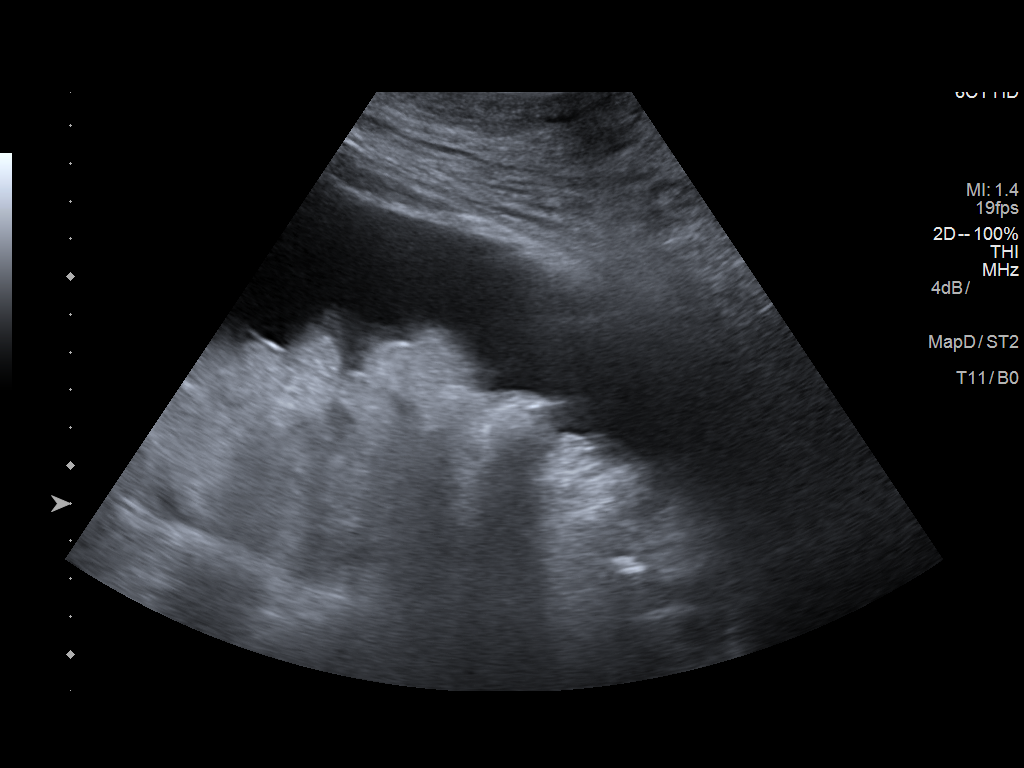
[im 3/5]
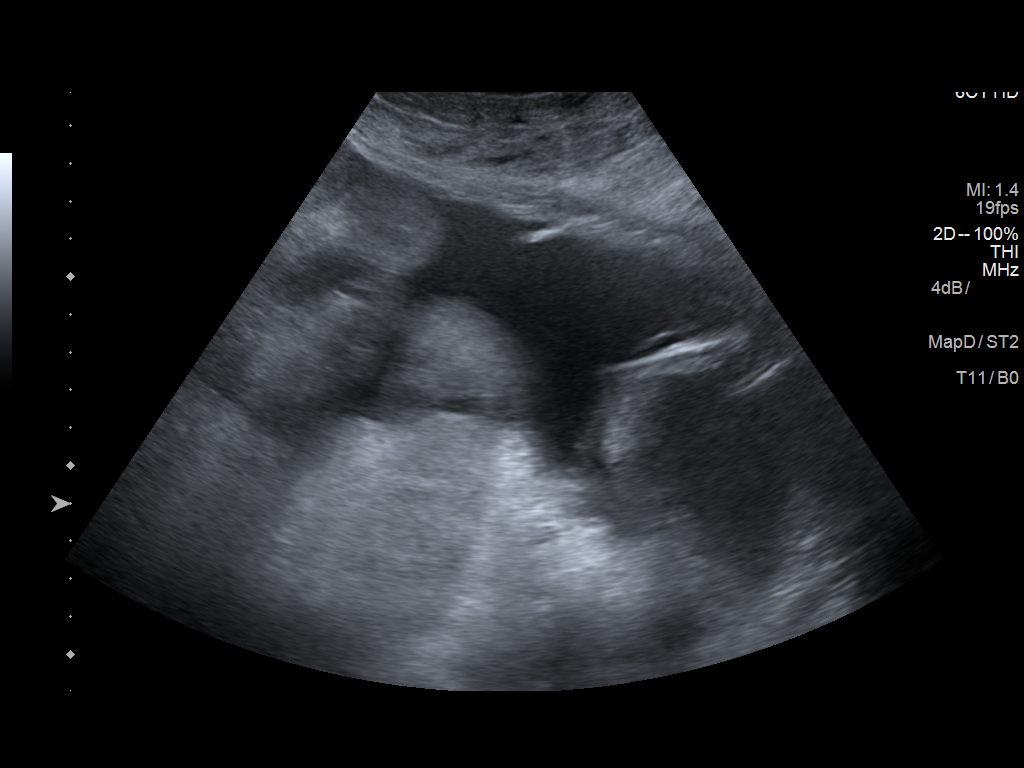
[im 4/5]
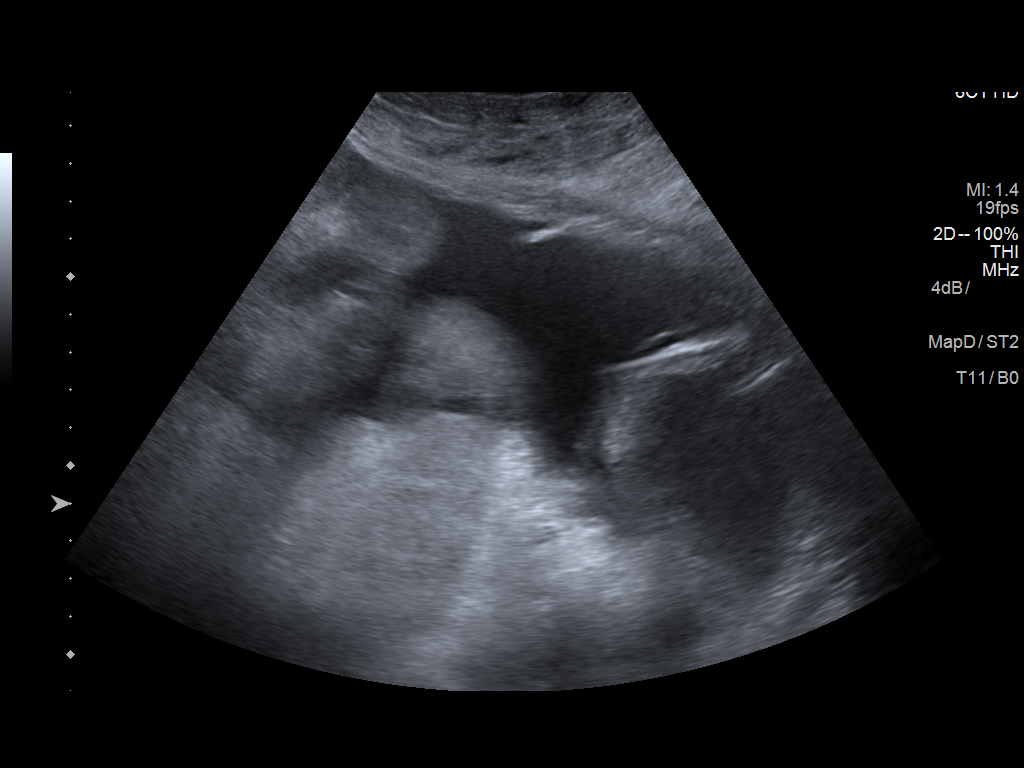
[im 5/5]
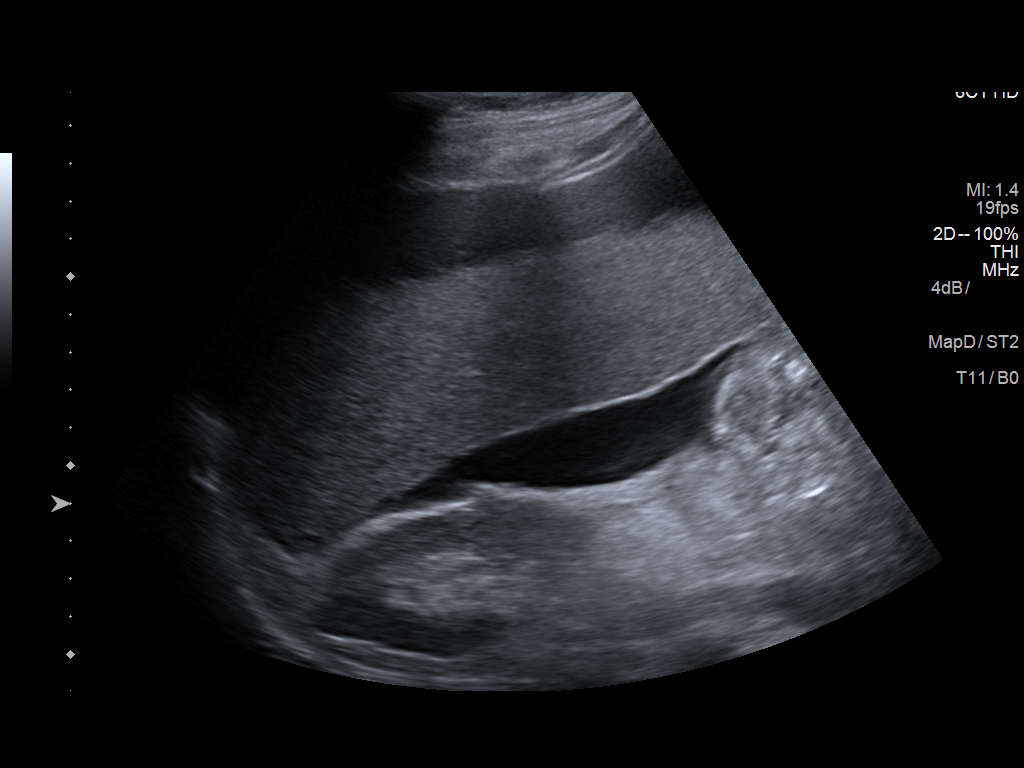

[5 of 5 positions shown; findings below may reference images not displayed]

FINDINGS: Mild ascites is noted in all 4 quadrants although no sizable pocket
to allow for safe paracentesis is noted at this time. Given the fact
the patient had a 9 L drainage yesterday, paracentesis should be
delayed for at least 1 week.
IMPRESSION: Mild ascites. Recommend paracentesis in 1 week or when patient is
significantly clinically symptomatic.
